# Patient Record
Sex: Female | Born: 1955 | Race: White | Hispanic: No | Smoking: Current every day smoker
Health system: Southern US, Community
[De-identification: ages and names within clinical notes are randomized; demographics above are authoritative.]

## PROBLEM LIST (undated history)

## (undated) DIAGNOSIS — I509 Heart failure, unspecified: Secondary | ICD-10-CM

## (undated) DIAGNOSIS — R296 Repeated falls: Secondary | ICD-10-CM

## (undated) DIAGNOSIS — E785 Hyperlipidemia, unspecified: Secondary | ICD-10-CM

## (undated) DIAGNOSIS — J449 Chronic obstructive pulmonary disease, unspecified: Secondary | ICD-10-CM

## (undated) DIAGNOSIS — F17200 Nicotine dependence, unspecified, uncomplicated: Secondary | ICD-10-CM

## (undated) DIAGNOSIS — M810 Age-related osteoporosis without current pathological fracture: Secondary | ICD-10-CM

## (undated) DIAGNOSIS — I4891 Unspecified atrial fibrillation: Secondary | ICD-10-CM

## (undated) DIAGNOSIS — R413 Other amnesia: Secondary | ICD-10-CM

## (undated) DIAGNOSIS — J9601 Acute respiratory failure with hypoxia: Secondary | ICD-10-CM

## (undated) DIAGNOSIS — I6529 Occlusion and stenosis of unspecified carotid artery: Secondary | ICD-10-CM

## (undated) DIAGNOSIS — I739 Peripheral vascular disease, unspecified: Secondary | ICD-10-CM

## (undated) DIAGNOSIS — Z9981 Dependence on supplemental oxygen: Secondary | ICD-10-CM

## (undated) HISTORY — PX: TUBAL LIGATION: SHX77

## (undated) HISTORY — DX: Nicotine dependence, unspecified, uncomplicated: F17.200

---
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
## 2013-07-05 ENCOUNTER — Encounter (HOSPITAL_COMMUNITY): Payer: Self-pay | Admitting: Emergency Medicine

## 2013-07-05 ENCOUNTER — Emergency Department (HOSPITAL_COMMUNITY): Payer: Self-pay

## 2013-07-05 ENCOUNTER — Emergency Department (HOSPITAL_COMMUNITY)
Admission: EM | Admit: 2013-07-05 | Discharge: 2013-07-05 | Disposition: A | Discharge status: Home / Self Care | Payer: Self-pay | Attending: Emergency Medicine | Admitting: Emergency Medicine

## 2013-07-05 DIAGNOSIS — K047 Periapical abscess without sinus: Secondary | ICD-10-CM | POA: Insufficient documentation

## 2013-07-05 DIAGNOSIS — W1809XA Striking against other object with subsequent fall, initial encounter: Secondary | ICD-10-CM | POA: Insufficient documentation

## 2013-07-05 DIAGNOSIS — Y929 Unspecified place or not applicable: Secondary | ICD-10-CM | POA: Insufficient documentation

## 2013-07-05 DIAGNOSIS — S2231XA Fracture of one rib, right side, initial encounter for closed fracture: Secondary | ICD-10-CM

## 2013-07-05 DIAGNOSIS — S2239XA Fracture of one rib, unspecified side, initial encounter for closed fracture: Secondary | ICD-10-CM | POA: Insufficient documentation

## 2013-07-05 DIAGNOSIS — W010XXA Fall on same level from slipping, tripping and stumbling without subsequent striking against object, initial encounter: Secondary | ICD-10-CM | POA: Insufficient documentation

## 2013-07-05 DIAGNOSIS — Y9389 Activity, other specified: Secondary | ICD-10-CM | POA: Insufficient documentation

## 2013-07-05 DIAGNOSIS — F17200 Nicotine dependence, unspecified, uncomplicated: Secondary | ICD-10-CM | POA: Insufficient documentation

## 2013-07-05 MED ORDER — AMOXICILLIN 500 MG PO CAPS: 500.0000 mg | ORAL_CAPSULE | Freq: Three times a day (TID) | ORAL | Status: DC

## 2013-07-05 MED ORDER — OXYCODONE-ACETAMINOPHEN 5-325 MG PO TABS: 1.0000 | ORAL_TABLET | Freq: Four times a day (QID) | ORAL | Status: DC | PRN

## 2013-07-05 MED ORDER — HYDROCODONE-ACETAMINOPHEN 5-325 MG PO TABS: 1.0000 | ORAL_TABLET | ORAL | Status: DC | PRN

## 2013-07-05 MED ORDER — OXYCODONE-ACETAMINOPHEN 5-325 MG PO TABS
1.0000 | ORAL_TABLET | Freq: Once | ORAL | Status: AC
  Administered 2013-07-05: 1 via ORAL
  Filled 2013-07-05: qty 1

## 2013-07-05 NOTE — ED Provider Notes (Signed)
CSN: [id number]     Arrival date & time 07/05/13  1308 History   First MD Initiated Contact with Patient 07/05/13 1327     Chief Complaint  Patient presents with  . Dental Pain  . Chest Pain   (Consider location/radiation/quality/duration/timing/severity/associated sxs/prior Treatment) Patient is a 57 y.o. female presenting with fall. The history is provided by the patient.  Fall This is a new problem. The current episode started in the past 7 days. The problem has been unchanged. Pertinent negatives include no abdominal pain, chills, congestion, coughing, fever, headaches, nausea, sore throat or vomiting. The symptoms are aggravated by bending, twisting and walking. She has tried acetaminophen and heat for the symptoms. The treatment provided no relief.   Carrie Case is a 57 y.o. female who presents to the ED with right rib pain that started suddenly after she tripped and fell over a trash can outside 2 days ago. She denies any other injuries. She also complains of dental pain in the lower right dental area with swelling and drainage.  History reviewed. No pertinent past medical history. Past Surgical History  Procedure Laterality Date  . Tubal ligation     No family history on file. History  Substance Use Topics  . Smoking status: Current Every Day Smoker  . Smokeless tobacco: Not on file  . Alcohol Use: No   OB History   Grav Para Term Preterm Abortions TAB SAB Ect Mult Living                 Review of Systems  Constitutional: Negative for fever and chills.  HENT: Positive for dental problem and facial swelling. Negative for congestion and sore throat.   Eyes: Negative for visual disturbance.  Respiratory: Negative for cough, shortness of breath and wheezing.   Gastrointestinal: Negative for nausea, vomiting and abdominal pain.  Genitourinary: Negative for dysuria, urgency and frequency.  Musculoskeletal:       Right rib pain  Skin: Negative for wound.    Allergic/Immunologic: Negative for immunocompromised state.  Neurological: Negative for dizziness and headaches.  Psychiatric/Behavioral: The patient is not nervous/anxious.     Allergies  Ibuprofen  Home Medications   Current Outpatient Rx  Name  Route  Sig  Dispense  Refill  . acetaminophen (TYLENOL) 500 MG tablet   Oral   Take 1,000 mg by mouth every 6 (six) hours as needed for mild pain.          BP 142/78  Pulse 87  Temp(Src) 98.4 F (36.9 C) (Oral)  Resp 21  Ht 5\' 6"  (1.676 m)  Wt 103 lb (46.72 kg)  BMI 16.63 kg/m2  SpO2 99% Physical Exam  Nursing note and vitals reviewed. Constitutional: She is oriented to person, place, and time. She appears well-developed and well-nourished. No distress.  HENT:  Head: Normocephalic and atraumatic.  Mouth/Throat: Uvula is midline and oropharynx is clear and moist.    Dental abscess  Eyes: EOM are normal.  Neck: Neck supple.  Cardiovascular: Normal rate and regular rhythm.   Pulmonary/Chest: Effort normal and breath sounds normal.    Tender right anterior ribs  Abdominal: Soft. There is no tenderness.  Musculoskeletal: Normal range of motion.  Lymphadenopathy:    She has cervical adenopathy (right).  Neurological: She is alert and oriented to person, place, and time. No cranial nerve deficit.  Skin: Skin is warm and dry.  Psychiatric: She has a normal mood and affect. Her behavior is normal.    ED Course  Procedures  EKG Interpretation   None      Dg Ribs Unilateral W/chest Right  07/05/2013   CLINICAL DATA:  Fall, right chest and flank pain  EXAM: RIGHT RIBS AND CHEST - 3+ VIEW  COMPARISON:  None.  FINDINGS: Normal heart size. Chronic bronchitic changes centrally and diffuse interstitial prominence, nonspecific. Suspect chronic bronchitis. No definite focal pneumonia, collapse or consolidation. No effusion or pneumothorax. Inferior lateral right 8th acute rib fracture noted on the oblique view.  IMPRESSION:  Acute inferior lateral right 8th rib fracture.  No other acute finding   Electronically Signed   By: Ruel Favors M.D.   On: 07/05/2013 13:43    MDM  57 y.o. female with right 8th rib fracture after falling over a trash can 2 days ago. Also here with a dental abscess. Patient stable for discharge without any immediate complications. Lungs clear with good air movement. O2 SAT 99% on R/A. I have reviewed this patient's vital signs, nurses notes, appropriate labs and imaging.  I have discussed findings with the patient and plan of care. She voices understanding.    Medication List    TAKE these medications       amoxicillin 500 MG capsule  Commonly known as:  AMOXIL  Take 1 capsule (500 mg total) by mouth 3 (three) times daily.     oxyCODONE-acetaminophen 5-325 MG per tablet  Commonly known as:  ROXICET  Take 1 tablet by mouth every 6 (six) hours as needed for severe pain.      ASK your doctor about these medications       acetaminophen 500 MG tablet  Commonly known as:  TYLENOL  Take 1,000 mg by mouth every 6 (six) hours as needed for mild pain.           Janne Napoleon, [place] 07/05/13 [phone]

## 2013-07-05 NOTE — ED Notes (Signed)
Pt reports 2 days ago tripped and fell on her trash can.  C/O pain r ribs.  Pt also c/o toothache x 4 days.

## 2013-07-06 NOTE — ED Provider Notes (Signed)
Medical screening examination/treatment/procedure(s) were performed by non-physician practitioner and as supervising physician I was immediately available for consultation/collaboration.  EKG Interpretation   None         Joya Gaskins, MD 07/06/13 [phone]

## 2015-01-08 ENCOUNTER — Emergency Department (HOSPITAL_COMMUNITY)
Admission: EM | Admit: 2015-01-08 | Discharge: 2015-01-08 | Disposition: A | Discharge status: Home / Self Care | Payer: Self-pay | Attending: Emergency Medicine | Admitting: Emergency Medicine

## 2015-01-08 ENCOUNTER — Encounter (HOSPITAL_COMMUNITY): Payer: Self-pay | Admitting: *Deleted

## 2015-01-08 DIAGNOSIS — K0889 Other specified disorders of teeth and supporting structures: Secondary | ICD-10-CM

## 2015-01-08 DIAGNOSIS — Z720 Tobacco use: Secondary | ICD-10-CM | POA: Insufficient documentation

## 2015-01-08 DIAGNOSIS — K088 Other specified disorders of teeth and supporting structures: Secondary | ICD-10-CM | POA: Insufficient documentation

## 2015-01-08 MED ORDER — PENICILLIN V POTASSIUM 500 MG PO TABS: 500.0000 mg | ORAL_TABLET | Freq: Four times a day (QID) | ORAL | Status: AC

## 2015-01-08 MED ORDER — ACETAMINOPHEN 325 MG PO TABS
650.0000 mg | ORAL_TABLET | Freq: Once | ORAL | Status: DC
  Filled 2015-01-08: qty 2

## 2015-01-08 MED ORDER — PENICILLIN V POTASSIUM 250 MG PO TABS
500.0000 mg | ORAL_TABLET | Freq: Once | ORAL | Status: AC
  Administered 2015-01-08: 500 mg via ORAL
  Filled 2015-01-08: qty 2

## 2015-01-08 NOTE — ED Notes (Signed)
Pt reporting tooth abscess on right lower side.

## 2015-01-08 NOTE — Discharge Instructions (Signed)

## 2015-01-08 NOTE — ED Provider Notes (Signed)
CSN: [id number]     Arrival date & time 01/08/15  1823 History   First MD Initiated Contact with Patient 01/08/15 1919     Chief Complaint  Patient presents with  . Dental Pain     Patient is a 59 y.o. female presenting with tooth pain. The history is provided by the patient.  Dental Pain Location:  Lower Severity:  Moderate Onset quality:  Gradual Duration:  1 day Timing:  Constant Progression:  Worsening Chronicity:  Recurrent Relieved by:  Rest Worsened by:  Jaw movement and pressure Associated symptoms: no difficulty swallowing and no fever     PMH - none  Past Surgical History  Procedure Laterality Date  . Tubal ligation     History reviewed. No pertinent family history. History  Substance Use Topics  . Smoking status: Current Every Day Smoker    Types: Cigarettes  . Smokeless tobacco: Not on file  . Alcohol Use: No   OB History    No data available     Review of Systems  Constitutional: Negative for fever.  Gastrointestinal: Negative for vomiting.      Allergies  Ibuprofen  Home Medications   Prior to Admission medications   Medication Sig Start Date End Date Taking? Authorizing Provider  acetaminophen (TYLENOL) 500 MG tablet Take 1,000 mg by mouth every 6 (six) hours as needed for mild pain.    Historical Provider, MD  penicillin v potassium (VEETID) 500 MG tablet Take 1 tablet (500 mg total) by mouth 4 (four) times daily. 01/08/15 01/15/15  Zadie Rhineonald German Manke, MD   BP 160/95 mmHg  Pulse 70  Temp(Src) 98.5 F (36.9 C) (Oral)  Resp 18  Ht 5\' 6"  (1.676 m)  Wt 160 lb (72.576 kg)  BMI 25.84 kg/m2  SpO2 100% Physical Exam CONSTITUTIONAL: Well developed/well nourished HEAD AND FACE: Normocephalic/atraumatic EYES: EOMI/PERRL ENMT: Mucous membranes moist.  Poor dentition.  No trismus.  No focal abscess noted.   NECK: supple no meningeal signs CV: S1/S2 noted, no murmurs/rubs/gallops noted LUNGS: Lungs are clear to auscultation bilaterally, no  apparent distress ABDOMEN: soft, nontender, no rebound or guarding NEURO: Pt is awake/alert, moves all extremitiesx4 EXTREMITIES:full ROM SKIN: warm, color normal  ED Course  Procedures    MDM   Final diagnoses:  Pain, dental    Nursing notes including past medical history and social history reviewed and considered in documentation     Zadie Rhineonald Priyana Mccarey, MD 01/08/15 [phone]

## 2018-12-22 ENCOUNTER — Encounter (HOSPITAL_COMMUNITY): Payer: Self-pay | Admitting: Internal Medicine

## 2018-12-22 ENCOUNTER — Inpatient Hospital Stay (HOSPITAL_COMMUNITY)
Admission: EM | Admit: 2018-12-22 | Discharge: 2018-12-25 | DRG: 308 | Disposition: A | Discharge status: Home / Self Care | Payer: Self-pay | Source: Other Acute Inpatient Hospital | Attending: Family Medicine | Admitting: Family Medicine

## 2018-12-22 DIAGNOSIS — J9601 Acute respiratory failure with hypoxia: Secondary | ICD-10-CM | POA: Diagnosis present

## 2018-12-22 DIAGNOSIS — I4891 Unspecified atrial fibrillation: Secondary | ICD-10-CM | POA: Diagnosis present

## 2018-12-22 DIAGNOSIS — R0989 Other specified symptoms and signs involving the circulatory and respiratory systems: Secondary | ICD-10-CM

## 2018-12-22 DIAGNOSIS — R918 Other nonspecific abnormal finding of lung field: Secondary | ICD-10-CM

## 2018-12-22 DIAGNOSIS — R0682 Tachypnea, not elsewhere classified: Secondary | ICD-10-CM

## 2018-12-22 DIAGNOSIS — I480 Paroxysmal atrial fibrillation: Principal | ICD-10-CM | POA: Diagnosis present

## 2018-12-22 DIAGNOSIS — Z8249 Family history of ischemic heart disease and other diseases of the circulatory system: Secondary | ICD-10-CM

## 2018-12-22 DIAGNOSIS — Z79899 Other long term (current) drug therapy: Secondary | ICD-10-CM

## 2018-12-22 DIAGNOSIS — Z560 Unemployment, unspecified: Secondary | ICD-10-CM

## 2018-12-22 DIAGNOSIS — Z7982 Long term (current) use of aspirin: Secondary | ICD-10-CM

## 2018-12-22 DIAGNOSIS — Z886 Allergy status to analgesic agent status: Secondary | ICD-10-CM

## 2018-12-22 DIAGNOSIS — F17200 Nicotine dependence, unspecified, uncomplicated: Secondary | ICD-10-CM | POA: Diagnosis present

## 2018-12-22 DIAGNOSIS — J22 Unspecified acute lower respiratory infection: Secondary | ICD-10-CM | POA: Diagnosis present

## 2018-12-22 DIAGNOSIS — Z20828 Contact with and (suspected) exposure to other viral communicable diseases: Secondary | ICD-10-CM | POA: Diagnosis present

## 2018-12-22 DIAGNOSIS — F17210 Nicotine dependence, cigarettes, uncomplicated: Secondary | ICD-10-CM | POA: Diagnosis present

## 2018-12-22 DIAGNOSIS — Z9851 Tubal ligation status: Secondary | ICD-10-CM

## 2018-12-22 HISTORY — DX: Acute respiratory failure with hypoxia: J96.01

## 2018-12-22 LAB — HEMOGLOBIN A1C
Hgb A1c MFr Bld: 5.6 % (ref 4.8–5.6)
Mean Plasma Glucose: 114.02 mg/dL

## 2018-12-22 LAB — CBC
HCT: 40 % (ref 36.0–46.0)
Hemoglobin: 13.3 g/dL (ref 12.0–15.0)
MCH: 32.5 pg (ref 26.0–34.0)
MCHC: 33.3 g/dL (ref 30.0–36.0)
MCV: 97.8 fL (ref 80.0–100.0)
Platelets: 381 10*3/uL (ref 150–400)
RBC: 4.09 MIL/uL (ref 3.87–5.11)
RDW: 13.8 % (ref 11.5–15.5)
WBC: 18.8 10*3/uL — ABNORMAL HIGH (ref 4.0–10.5)
nRBC: 1.7 % — ABNORMAL HIGH (ref 0.0–0.2)

## 2018-12-22 LAB — C-REACTIVE PROTEIN
CRP: 5.2 mg/dL — ABNORMAL HIGH (ref <1.0)
CRP: 5.2 mg/dL — ABNORMAL HIGH (ref <1.0)

## 2018-12-22 LAB — BASIC METABOLIC PANEL
Anion gap: 9 (ref 5–15)
BUN: 21 mg/dL (ref 8–23)
CO2: 27 mmol/L (ref 22–32)
Calcium: 9.7 mg/dL (ref 8.9–10.3)
Chloride: 105 mmol/L (ref 98–111)
Creatinine, Ser: 0.62 mg/dL (ref 0.44–1.00)
GFR calc Af Amer: >60 mL/min (ref >60)
GFR calc non Af Amer: >60 mL/min (ref >60)
Glucose, Bld: 109 mg/dL — ABNORMAL HIGH (ref 70–99)
Potassium: 4 mmol/L (ref 3.5–5.1)
Sodium: 141 mmol/L (ref 135–145)

## 2018-12-22 LAB — D-DIMER, QUANTITATIVE: D-Dimer, Quant: 2.87 ug/mL-FEU — ABNORMAL HIGH (ref 0.00–0.50)

## 2018-12-22 LAB — FIBRINOGEN: Fibrinogen: 593 mg/dL — ABNORMAL HIGH (ref 210–475)

## 2018-12-22 LAB — TSH: TSH: 1.246 u[IU]/mL (ref 0.350–4.500)

## 2018-12-22 LAB — HEPARIN LEVEL (UNFRACTIONATED): Heparin Unfractionated: 0.28 IU/mL — ABNORMAL LOW (ref 0.30–0.70)

## 2018-12-22 LAB — PROCALCITONIN: Procalcitonin: 0.2 ng/mL

## 2018-12-22 LAB — LACTIC ACID, PLASMA
Lactic Acid, Venous: 1.4 mmol/L (ref 0.5–1.9)
Lactic Acid, Venous: 1.9 mmol/L (ref 0.5–1.9)

## 2018-12-22 LAB — FERRITIN: Ferritin: 224 ng/mL (ref 11–307)

## 2018-12-22 LAB — SARS CORONAVIRUS 2 BY RT PCR (HOSPITAL ORDER, PERFORMED IN ~~LOC~~ HOSPITAL LAB): SARS Coronavirus 2: NEGATIVE

## 2018-12-22 MED ORDER — OXYCODONE HCL 5 MG PO TABS: 5.0000 mg | ORAL_TABLET | ORAL | Status: DC | PRN

## 2018-12-22 MED ORDER — HEPARIN BOLUS VIA INFUSION
4000.0000 [IU] | Freq: Once | INTRAVENOUS | Status: AC
  Administered 2018-12-22: 4000 [IU] via INTRAVENOUS
  Filled 2018-12-22: qty 4000

## 2018-12-22 MED ORDER — ACETAMINOPHEN 325 MG PO TABS: 650.0000 mg | ORAL_TABLET | Freq: Four times a day (QID) | ORAL | Status: DC | PRN

## 2018-12-22 MED ORDER — ONDANSETRON HCL 4 MG/2ML IJ SOLN: 4.0000 mg | Freq: Four times a day (QID) | INTRAMUSCULAR | Status: DC | PRN

## 2018-12-22 MED ORDER — DILTIAZEM HCL-DEXTROSE 100-5 MG/100ML-% IV SOLN (PREMIX)
5.0000 mg/h | INTRAVENOUS | Status: AC
  Administered 2018-12-22: 15 mg/h via INTRAVENOUS
  Administered 2018-12-22: 5 mg/h via INTRAVENOUS
  Administered 2018-12-23: 13 mg/h via INTRAVENOUS
  Administered 2018-12-23: 15 mg/h via INTRAVENOUS
  Filled 2018-12-22 (×5): qty 100

## 2018-12-22 MED ORDER — ASPIRIN EC 81 MG PO TBEC
81.0000 mg | DELAYED_RELEASE_TABLET | Freq: Every day | ORAL | Status: DC
  Administered 2018-12-22 – 2018-12-25 (×4): 81 mg via ORAL
  Filled 2018-12-22 (×4): qty 1

## 2018-12-22 MED ORDER — POLYETHYLENE GLYCOL 3350 17 G PO PACK: 17.0000 g | PACK | Freq: Every day | ORAL | Status: DC | PRN

## 2018-12-22 MED ORDER — ONDANSETRON HCL 4 MG PO TABS: 4.0000 mg | ORAL_TABLET | Freq: Four times a day (QID) | ORAL | Status: DC | PRN

## 2018-12-22 MED ORDER — ALBUTEROL SULFATE HFA 108 (90 BASE) MCG/ACT IN AERS
2.0000 | INHALATION_SPRAY | RESPIRATORY_TRACT | Status: DC | PRN
  Filled 2018-12-22: qty 6.7

## 2018-12-22 MED ORDER — DOCUSATE SODIUM 100 MG PO CAPS
100.0000 mg | ORAL_CAPSULE | Freq: Two times a day (BID) | ORAL | Status: DC
  Administered 2018-12-22 – 2018-12-25 (×7): 100 mg via ORAL
  Filled 2018-12-22 (×7): qty 1

## 2018-12-22 MED ORDER — NICOTINE 21 MG/24HR TD PT24
21.0000 mg | MEDICATED_PATCH | Freq: Every day | TRANSDERMAL | Status: DC
  Administered 2018-12-22 – 2018-12-25 (×4): 21 mg via TRANSDERMAL
  Filled 2018-12-22 (×4): qty 1

## 2018-12-22 MED ORDER — BISACODYL 5 MG PO TBEC: 5.0000 mg | DELAYED_RELEASE_TABLET | Freq: Every day | ORAL | Status: DC | PRN

## 2018-12-22 MED ORDER — HEPARIN (PORCINE) 25000 UT/250ML-% IV SOLN
1300.0000 [IU]/h | INTRAVENOUS | Status: DC
  Administered 2018-12-22: 1000 [IU]/h via INTRAVENOUS
  Administered 2018-12-23: 1200 [IU]/h via INTRAVENOUS
  Filled 2018-12-22 (×2): qty 250

## 2018-12-22 NOTE — Progress Notes (Signed)
Call from Pharmacy change Heparin drip to 1100 units/hr or 11 ml/hr.

## 2018-12-22 NOTE — Consult Note (Signed)
[facility]       Reason for Consult: pulmonary nodules    Referring Physician: Dr. Ophelia CharterYates  Principal Problem:   Acute respiratory failure with hypoxia [facility](HCC) Active Problems:   Tobacco dependence   Atrial fibrillation with RVR (HCC)   . aspirin EC  81 mg Oral Daily  . docusate sodium  100 mg Oral BID  . nicotine  21 mg Transdermal Daily    Recommendations: Observe off of antibiotics Repeat CXR or CT in 3 months  Will consider further evaluation as an outpatient I do not see an indication for repeat COVID testing  Assessment: [facility] that [facility] family felt she was having though the patient states she was asymptomatic.  CT of chest with no PE or opacity and only some small nodules and early bronchiectatic changes.     She has some hypoxia in the setting of afib.  She has no fever, no pulmonary opacities so not c/w COVID  Respiratory failure - likely from afib.  No signs of infection.    Antibiotics: none  HPI: Carrie Case is a 63 y.o. female with a long history of tobacco abuse who [facility] and CT with findings as above.  Transferred here for further management.  Long smoking history. Recently was seen by a local physician and gave her doxycycline and inhaler for possible COPD exacerbation.  No fever at home.  Now found to be tachycardic, in afib.  [facility] though no indication as to what it was for.     Review of Systems:  Constitutional: negative for fevers, chills, malaise and anorexia Respiratory: positive for cough, negative for sputum or hemoptysis Integument/breast: negative for rash All other systems reviewed and are negative    PMH: tobacco abuse  Social History   Tobacco Use  . Smoking status: Current Every Day Smoker    Packs/day: 2.00    Years: 47.00    Pack years: 94.00    Types: Cigarettes  . Smokeless tobacco: Never Used   Substance Use Topics  . Alcohol use: No  . Drug use: No    FMH: + cardiovascular disease  Allergies  Allergen Reactions  . Ibuprofen Hives    Physical Exam: Constitutional: in no apparent distress  Vitals:   12/22/18 1128 12/22/18 1200  BP: (!) 135/117 121/81  Pulse:    Temp:    SpO2:     EYES: anicteric ENMT: no thrush Cardiovascular: Cor irreg, irreg RRR Respiratory: CTA B; normal respiratory effort GI: Bowel sounds are normal, liver is not enlarged, spleen is not enlarged Musculoskeletal: no pedal edema noted Skin: negatives: no rash Neuro: non-focal  Lab Results  Component Value Date   WBC 18.8 (H) 12/22/2018   HGB 13.3 12/22/2018   HCT 40.0 12/22/2018   MCV 97.8 12/22/2018   PLT 381 12/22/2018    Lab Results  Component Value Date   CREATININE 0.62 12/22/2018   BUN 21 12/22/2018   NA 141 12/22/2018   K 4.0 12/22/2018   CL 105 12/22/2018   CO2 27 12/22/2018   No results found for: ALT, AST, GGT, ALKPHOS   Microbiology: Recent Results (from the past 240 hour(s))  Culture, blood (Routine X 2) w Reflex to ID Panel     Status: None (Preliminary result)   Collection Time: 12/22/18 11:40 AM  Result Value Ref Range Status   Specimen Description  BLOOD RIGHT ANTECUBITAL  Final   Special Requests   Final    BOTTLES DRAWN AEROBIC ONLY Blood Culture results may not be optimal due to an inadequate volume of blood received in culture bottles   Culture   Final    NO GROWTH < 12 HOURS Performed at [facility], [address], [place], [place] [address]    Report Status PENDING  Incomplete  Culture, blood (Routine X 2) w Reflex to ID Panel     Status: None (Preliminary result)   Collection Time: 12/22/18 11:45 AM  Result Value Ref Range Status   Specimen Description BLOOD RIGHT ANTECUBITAL  Final   Special Requests   Final    BOTTLES DRAWN AEROBIC ONLY Blood Culture adequate volume   Culture   Final    NO GROWTH < 12 HOURS Performed at [facility], [address], [place], [place] [address]    Report Status PENDING  Incomplete  SARS Coronavirus 2 [facility] order, Performed in [facility] hospital lab)     Status: None   Collection Time: 12/22/18  2:58 PM  Result Value Ref Range Status   SARS Coronavirus 2 NEGATIVE NEGATIVE Final    Comment: (NOTE) If result is NEGATIVE SARS-CoV-2 target nucleic acids are NOT DETECTED. The SARS-CoV-2 RNA is generally detectable in upper and lower  respiratory specimens during the acute phase of infection. The lowest  concentration of SARS-CoV-2 viral copies this assay can detect is 250  copies / mL. A negative result does not preclude SARS-CoV-2 infection  and should not be used as the sole basis for treatment or other  patient management decisions.  A negative result may occur with  improper specimen collection / handling, submission of specimen other  than nasopharyngeal swab, presence of viral mutation(s) within the  areas targeted by this assay, and inadequate number of viral copies  (<250 copies / mL). A negative result must be combined with clinical  observations, patient history, and epidemiological information. If result is POSITIVE SARS-CoV-2 target nucleic acids are DETECTED. The SARS-CoV-2 RNA is generally detectable in upper and lower  respiratory specimens dur ing the acute phase of infection.  Positive  results are indicative of active infection with SARS-CoV-2.  Clinical  correlation with patient history and other diagnostic information is  necessary to determine patient infection status.  Positive results do  not rule out bacterial infection or co-infection with other viruses. If result is PRESUMPTIVE POSTIVE SARS-CoV-2 nucleic acids MAY BE PRESENT.   A presumptive positive result was obtained on the submitted specimen  and confirmed on repeat testing.  While 2019 novel coronavirus  (SARS-CoV-2) nucleic acids may be present in the submitted sample  additional  confirmatory testing may be necessary for epidemiological  and / or clinical management purposes  to differentiate between  SARS-CoV-2 and other Sarbecovirus currently known to infect humans.  If clinically indicated additional testing with an alternate test  methodology [phone]) is advised. The SARS-CoV-2 RNA is generally  detectable in upper and lower respiratory sp ecimens during the acute  phase of infection. The expected result is Negative. Fact Sheet for Patients:  BoilerBrush.com.cy Fact Sheet for Healthcare Providers: https://pope.com/ This test is not yet approved or cleared by the [place] FDA and has been authorized for detection and/or diagnosis of SARS-CoV-2 by FDA under an Emergency Use Authorization (EUA).  This EUA will remain in effect (meaning this test can be used) for the duration of the COVID-19 declaration  under Section 564(b)(1) of the Act, 21 U.S.C. section 360bbb-3(b)(1), unless the authorization is terminated or revoked sooner. Performed at [facility], [address] [place], [place] [address]     Gardiner Barefoot, MD [facility] [email or web address] 12/22/2018, 5:08 PM

## 2018-12-22 NOTE — Progress Notes (Signed)
Lab came and drew speciman for Heparin level

## 2018-12-22 NOTE — Progress Notes (Signed)
ANTICOAGULATION CONSULT NOTE  Pharmacy Consult:  Heparin Indication: atrial fibrillation   Patient Measurements: Height: 5\' 6"  (167.6 cm) Weight: 122 lb 9.2 oz (55.6 kg) IBW/kg (Calculated) : 59.3 Heparin Dosing Weight: 56 kg   Vital Signs: Temp: 98.5 F (36.9 C) (05/04 2020) Temp Source: Oral (05/04 2020) BP: 108/92 (05/04 2020) Pulse Rate: 113 (05/04 2021)  Labs: Recent Labs    12/22/18 1143 12/22/18 2031  HGB 13.3  --   HCT 40.0  --   PLT 381  --   HEPARINUNFRC  --  0.28*  CREATININE 0.62  --      Assessment: 3 YOF admitted with dyspnea, found to be in AFib and started on IV heparin.  Heparin level is slightly sub-therapeutic.  No issue with infusion per RN.  No bleeding reported.  Goal of Therapy:  Heparin level 0.3-0.7 units/ml Monitor platelets by anticoagulation protocol: Yes   Plan:  Increase heparin gtt to 1100 unit/hr F/U AM labs  Roxy Filler D. Laney Potash, PharmD, BCPS, BCCCP 12/22/2018, 10:04 PM

## 2018-12-22 NOTE — Progress Notes (Signed)
ANTICOAGULATION CONSULT NOTE - Initial Consult  Pharmacy Consult for heparin Indication: atrial fibrillation   Patient Measurements: Height: 5\' 6"  (167.6 cm) Weight: 160 lb (72.6 kg) IBW/kg (Calculated) : 59.3 Heparin Dosing Weight: 72.6 kg  Vital Signs: Temp: 96.4 F (35.8 C) (05/04 1056) Temp Source: Axillary (05/04 1056) BP: 124/102 (05/04 1000) Pulse Rate: 140 (05/04 1000)  Labs: No results for input(s): HGB, HCT, PLT, APTT, LABPROT, INR, HEPARINUNFRC, HEPRLOWMOCWT, CREATININE, CKTOTAL, CKMB, TROPONINI in the last 72 hours.   Assessment: 63 yo female admitted with dyspnea, found to be in AFib. Starting heparin infusion. SCr 0.6, cbc ok.   Goal of Therapy:  Heparin level 0.3-0.7 units/ml Monitor platelets by anticoagulation protocol: Yes    Plan:  -Heparin bolus 4000 units x1 then 1000 units/hr -Daily HL, CBC -Check level this afternoon   Baldemar Friday 12/22/2018,11:06 AM

## 2018-12-22 NOTE — H&P (Signed)
History and Physical    Carrie Case:[id number] DOB: Dec 28, 1955 DOA: 12/22/2018  PCP: Patient, No Pcp Per Consultants:  None Patient coming from:  Home - lives with daughter and roommate; NOK: Daughter, [phone]  Chief Complaint: SOB  HPI: Carrie Case is a 63 y.o. female with no significant past medical history significant of presetngin with SOB.  "I don't know.  I honestly don't know."  She went to [facility] because "they told me if I refused to go they were gonna make me go anyway."  Her daughter was concerned that the she couldn't breathe, but he patient states she did not feel SOB.  No fever.  Slight cough, nonproductive.  Smokes 1ppd now, previously 2ppd.  She was testing for COVID at the hospital.  Currently, she feels fine.  Denies h/o afib.  She does not feel like her heart is racing.  I spoke with the patient's daughter.  She had been sick.  Her brother wanted her to see how much the covid test costs.  All the family wanted her tested.  They called the covid helpline on Thursday.  The family could not convince her to go to the hospital.  Monday, she said she PNA.  Wednesday, she thought she had COVID.  Friday, she hadn't gotten her medicine.  She was a little more coherent, but Saturday and Sunday family couldn't reach her.  Her daughter came to see her and called 911 and transported her to the hospital.  They gave her some Cardizem.  She has been confused, talking out of her head.  "I don't live with her, I've been calling and texting."  The patient was "screaming at me, I didn't do it."  Her daughter is not aware of her having a problem with drugs, but sometimes her speech is slurred.  She had a telephone visit from 4/30-5/1 for cough, SOB since 4/19, concern for COVID.  Long-standing tobacco history, ?undiagnosed COPD.  Treated empircally as CAP with inhalers for reactive airways.   Given doxycycline, Albuterol HFA and nebs.  ED Course: [facility] transfer, per Dr. Julian Reil:  Rudean Curt 63 yo F comes in with dyspnea for several days, intermittently confused at home. Tachycardic, A.Fib RVR 170-180 en route. Cardizem did slow her to 130s. Mild confusion. 9/10 pain due to "tooth ache". Daily smoker, no home O2. Minimal cough over last several days.  Initially 94% on 2L, BP 120s / 90s, HR 120 afib, RR 25.  Put on Cardizem gtt but BP drops when HR goes below 120s. then she became more obtunded with GCS 13. finally got history that she took oxycodone. Got Narcan and now awake. Now denying that shes in pain.  CBC WBC 28.7, HGB 14.7. Sodium 136, BUN 44 with creat 0.7. BGL 150. 2 lactates 1.9 and 2.0. AST and ALT okay. Trop nl x1. ABG 7.41, CO2 48, O2 61 (but on room air) before Narcan.  30/kg NS  vanc and Zosyn for ABx  no fever  CTA shows no clot, no PNA, but ? MAI per radiologist. Only on 2L. COVID test is a send out so no results back. So this is a PUI for covid at this point.  Review of Systems: As per HPI; otherwise review of systems reviewed and negative.   Ambulatory Status:  Ambulates without assistance  History reviewed. No pertinent past medical history.  Past Surgical History:  Procedure Laterality Date  . TUBAL LIGATION      Social History   Socioeconomic  History  . Marital status: Widowed    Spouse name: Not on file  . Number of children: Not on file  . Years of education: Not on file  . Highest education level: Not on file  Occupational History  . Occupation: unemployed  Social Needs  . Financial resource strain: Not on file  . Food insecurity:    Worry: Not on file    Inability: Not on file  . Transportation needs:    Medical: Not on file    Non-medical: Not on file  Tobacco Use  . Smoking status: Current Every Day Smoker    Packs/day: 2.00    Years: 47.00    Pack years: 94.00    Types: Cigarettes  . Smokeless tobacco: Never Used  Substance and Sexual Activity  . Alcohol use: No  . Drug use: No  . Sexual activity: Not on file   Lifestyle  . Physical activity:    Days per week: Not on file    Minutes per session: Not on file  . Stress: Not on file  Relationships  . Social connections:    Talks on phone: Not on file    Gets together: Not on file    Attends religious service: Not on file    Active member of club or organization: Not on file    Attends meetings of clubs or organizations: Not on file    Relationship status: Not on file  . Intimate partner violence:    Fear of current or ex partner: Not on file    Emotionally abused: Not on file    Physically abused: Not on file    Forced sexual activity: Not on file  Other Topics Concern  . Not on file  Social History Narrative  . Not on file    Allergies  Allergen Reactions  . Ibuprofen Hives    History reviewed. No pertinent family history.  Prior to Admission medications   Medication Sig Start Date End Date Taking? Authorizing Provider  acetaminophen (TYLENOL) 500 MG tablet Take 1,000 mg by mouth every 6 (six) hours as needed for mild pain.    [provider]    Physical Exam: Vitals:   12/22/18 1000 12/22/18 1056 12/22/18 1128 12/22/18 1200  BP: (!) 124/102  (!) 135/117 121/81  Pulse: (!) 140     Temp: (!) 96.4 F (35.8 C) (!) 96.4 F (35.8 C)    TempSrc: Axillary Axillary    SpO2: 93%     Weight:  55.6 kg    Height:  5\' 6"  (1.676 m)       . General:  Appears chronically ill and cachectic . Eyes:  EOMI, normal lids, iris . ENT:  grossly normal hearing, lips & tongue, mmm . Neck:  no LAD, masses or thyromegaly . Cardiovascular:  Irregularly irregular, ongoing tachycardia to 120 range despite 15 Cardizem, no m/r/g. No LE edema.  Marland Kitchen. Respiratory:   Diffuse rhonchi scattered throughout lung fields with mildly increased respiratory effort. . Abdomen:  soft, NT, ND, NABS . Back:   normal alignment, no CVAT . Skin:  no rash or induration seen on limited exam . Musculoskeletal:  grossly normal tone BUE/BLE, good ROM, no bony  abnormality . Psychiatric:  flat mood and affect, speech fluent and appropriate, AOx3 . Neurologic:  CN 2-12 grossly intact, moves all extremities in coordinated fashion, sensation intact    Radiological Exams on Admission: No results found.  EKG: Independently reviewed.  NSR with rate ; nonspecific ST  changes with no evidence of acute ischemia   Labs on Admission: I have personally reviewed the available labs and imaging studies at the time of the admission.  Pertinent labs from [facility]:   CBC WBC 28.7, HGB 14.7. Sodium 136, BUN 44 with creat 0.7. BGL 150. 2 lactates 1.9 and 2.0. AST and ALT okay. Trop nl x1. ABG 7.41, CO2 48, O2 61 (but on room air) before Narcan.   Here: Glucose 109 CRP 5.2 Lactate 1.4, 1.9 Procalcitonin 0.20 WBC 18.8 D-dimer 2.87 Fibrinogen 593 COVID negative Blood cultures pending  Assessment/Plan Principal Problem:   Acute respiratory failure with hypoxia (HCC) Active Problems:   Tobacco dependence   Atrial fibrillation with RVR (HCC)   Acute respiratory failure with hypoxia -Patient without known h/o medical problems (does not see a physician) presenting with SOB and confusion at home; also with cough mostly nonproductive of sputum -There is no current alternative explanation for her symptoms and so COVID-19 infection must be considered -Initial in-house testing was negative, but will perform a secondary send-out test as well to ensure no active infection -CTA at [facility] did not show PNA or PE but did show changes concerning for MAI; ID consult has been requested. -Pertinent labs concerning for COVID include low procalcitonin; markedly elevated D-dimer (>1); elevated CRP - although these tests are all non-specific -Will admit for further evaluation, close monitoring, and treatment -Monitor on telemetry x at least 24 hours -At this time, will attempt to avoid use of aerosolized medications and use HFAs instead -Will check daily labs including BMP; LFTs;  CBC with differential; CRP (q12h); D-dimer (q12h) -Patient was seen wearing full PPE including: gown, gloves, head cover, N95, and face shield; donning and doffing was observed to be in compliance with current standards. -Patient log was signed  -Blood and sputum cultures are pending  Afib with RVR -Patient found to be in new-onset afib with RVR -Etiology is unknown.  -Since the afib onset is unknown, will focus on rate control and searching for the underlying cause at this time. -Will admit to SDU for Diltiazem drip as per protocol with plan to transition to PO Diltiazem once heart rate is controlled.   -Troponin negative x 1, denies chest pain -Will give ASA 81 mg PO daily.   -Will request Echocardiogram for further evaluation  -Will risk stratify with FLP and HgbA1c; will also order TSH and UDS.  -Patient is not on any anti-coagulatants at home.  -Will start heparin drip per pharmacy for now.  Tobacco dependence -Encourage cessation.   -This was discussed with the patient and should be reviewed on an ongoing basis.   -Patch ordered   DVT prophylaxis:  Heparin  Code Status:  Full - confirmed with patient Family Communication: None present; I spoke with her daughter by telephone. Disposition Plan:  Home once clinically improved Consults called: ID; consider cardiology based on Echo  Admission status: Admit - It is my clinical opinion that admission to INPATIENT is reasonable and necessary because of the expectation that this patient will require hospital care that crosses at least 2 midnights to treat this condition based on the medical complexity of the problems presented.  Given the aforementioned information, the predictability of an adverse outcome is felt to be significant.     Jonah Blue MD [facility]   How to contact the [facility] Attending or Consulting provider 7A - 7P or covering provider during after hours 7P -7A, for this patient?  1. Check the care team  in [facility]  and look for a) attending/consulting TR[facility] provider listed and b) the [facility] team listed 2. Log into [email or web address] and use [facility]'s universal password to access. If you do not have the password, please contact the hospital operator. 3. Locate the [facility] provider you are looking for under [facility] and page to a number that you can be directly reached. 4. If you still have difficulty reaching the provider, please page the [facility] (Director on Call) for the Hospitalists listed on amion for assistance.   12/22/2018, 4:27 PM

## 2018-12-23 ENCOUNTER — Inpatient Hospital Stay (HOSPITAL_COMMUNITY): Payer: Self-pay

## 2018-12-23 DIAGNOSIS — D72829 Elevated white blood cell count, unspecified: Secondary | ICD-10-CM

## 2018-12-23 DIAGNOSIS — I361 Nonrheumatic tricuspid (valve) insufficiency: Secondary | ICD-10-CM

## 2018-12-23 DIAGNOSIS — J439 Emphysema, unspecified: Secondary | ICD-10-CM

## 2018-12-23 LAB — CBC WITH DIFFERENTIAL/PLATELET
Abs Immature Granulocytes: 1.06 10*3/uL — ABNORMAL HIGH (ref 0.00–0.07)
Basophils Absolute: 0.1 10*3/uL (ref 0.0–0.1)
Basophils Relative: 1 %
Eosinophils Absolute: 0.2 10*3/uL (ref 0.0–0.5)
Eosinophils Relative: 1 %
HCT: 38.2 % (ref 36.0–46.0)
Hemoglobin: 12.9 g/dL (ref 12.0–15.0)
Immature Granulocytes: 5 %
Lymphocytes Relative: 12 %
Lymphs Abs: 2.9 10*3/uL (ref 0.7–4.0)
MCH: 32.3 pg (ref 26.0–34.0)
MCHC: 33.8 g/dL (ref 30.0–36.0)
MCV: 95.7 fL (ref 80.0–100.0)
Monocytes Absolute: 1.4 10*3/uL — ABNORMAL HIGH (ref 0.1–1.0)
Monocytes Relative: 6 %
Neutro Abs: 18 10*3/uL — ABNORMAL HIGH (ref 1.7–7.7)
Neutrophils Relative %: 75 %
Platelets: 420 10*3/uL — ABNORMAL HIGH (ref 150–400)
RBC: 3.99 MIL/uL (ref 3.87–5.11)
RDW: 13.5 % (ref 11.5–15.5)
WBC: 23.6 10*3/uL — ABNORMAL HIGH (ref 4.0–10.5)
nRBC: 0.8 % — ABNORMAL HIGH (ref 0.0–0.2)

## 2018-12-23 LAB — LIPID PANEL
Cholesterol: 125 mg/dL (ref 0–200)
HDL: 26 mg/dL — ABNORMAL LOW (ref >40)
LDL Cholesterol: 61 mg/dL (ref 0–99)
Total CHOL/HDL Ratio: 4.8 RATIO
Triglycerides: 190 mg/dL — ABNORMAL HIGH (ref <150)
VLDL: 38 mg/dL (ref 0–40)

## 2018-12-23 LAB — COMPREHENSIVE METABOLIC PANEL
ALT: 36 U/L (ref 0–44)
AST: 27 U/L (ref 15–41)
Albumin: 2.5 g/dL — ABNORMAL LOW (ref 3.5–5.0)
Alkaline Phosphatase: 123 U/L (ref 38–126)
Anion gap: 10 (ref 5–15)
BUN: 7 mg/dL — ABNORMAL LOW (ref 8–23)
CO2: 32 mmol/L (ref 22–32)
Calcium: 9.9 mg/dL (ref 8.9–10.3)
Chloride: 96 mmol/L — ABNORMAL LOW (ref 98–111)
Creatinine, Ser: 0.56 mg/dL (ref 0.44–1.00)
GFR calc Af Amer: >60 mL/min (ref >60)
GFR calc non Af Amer: >60 mL/min (ref >60)
Glucose, Bld: 115 mg/dL — ABNORMAL HIGH (ref 70–99)
Potassium: 3.4 mmol/L — ABNORMAL LOW (ref 3.5–5.1)
Sodium: 138 mmol/L (ref 135–145)
Total Bilirubin: 0.4 mg/dL (ref 0.3–1.2)
Total Protein: 6.5 g/dL (ref 6.5–8.1)

## 2018-12-23 LAB — RAPID URINE DRUG SCREEN, HOSP PERFORMED
Amphetamines: NOT DETECTED
Barbiturates: NOT DETECTED
Benzodiazepines: NOT DETECTED
Cocaine: NOT DETECTED
Opiates: NOT DETECTED
Tetrahydrocannabinol: NOT DETECTED

## 2018-12-23 LAB — C-REACTIVE PROTEIN
CRP: 5.6 mg/dL — ABNORMAL HIGH (ref <1.0)
CRP: 7.5 mg/dL — ABNORMAL HIGH (ref <1.0)

## 2018-12-23 LAB — HEPARIN LEVEL (UNFRACTIONATED)
Heparin Unfractionated: 0.26 IU/mL — ABNORMAL LOW (ref 0.30–0.70)
Heparin Unfractionated: 0.41 IU/mL (ref 0.30–0.70)
Heparin Unfractionated: 0.21 IU/mL — ABNORMAL LOW (ref 0.30–0.70)

## 2018-12-23 LAB — ECHOCARDIOGRAM LIMITED
Height: 66 in
Weight: 1943.58 oz

## 2018-12-23 LAB — GLUCOSE, CAPILLARY: Glucose-Capillary: 106 mg/dL — ABNORMAL HIGH (ref 70–99)

## 2018-12-23 LAB — HIV ANTIBODY (ROUTINE TESTING W REFLEX): HIV Screen 4th Generation wRfx: NONREACTIVE

## 2018-12-23 LAB — PROCALCITONIN: Procalcitonin: 0.15 ng/mL

## 2018-12-23 MED ORDER — DILTIAZEM HCL 30 MG PO TABS
30.0000 mg | ORAL_TABLET | Freq: Four times a day (QID) | ORAL | Status: DC
  Administered 2018-12-23 – 2018-12-24 (×2): 30 mg via ORAL
  Filled 2018-12-23 (×2): qty 1

## 2018-12-23 MED ORDER — SODIUM CHLORIDE 0.9 % IV SOLN
1.0000 g | INTRAVENOUS | Status: DC
  Filled 2018-12-23 (×2): qty 10

## 2018-12-23 MED ORDER — SODIUM CHLORIDE 0.9 % IV SOLN
500.0000 mg | INTRAVENOUS | Status: DC
  Filled 2018-12-23 (×2): qty 500

## 2018-12-23 MED ORDER — LEVALBUTEROL HCL 1.25 MG/0.5ML IN NEBU: 1.2500 mg | INHALATION_SOLUTION | Freq: Three times a day (TID) | RESPIRATORY_TRACT | Status: DC

## 2018-12-23 MED ORDER — DILTIAZEM HCL 30 MG PO TABS
30.0000 mg | ORAL_TABLET | Freq: Four times a day (QID) | ORAL | Status: DC
  Administered 2018-12-23: 30 mg via ORAL
  Filled 2018-12-23: qty 1

## 2018-12-23 MED ORDER — LEVALBUTEROL HCL 1.25 MG/0.5ML IN NEBU
1.2500 mg | INHALATION_SOLUTION | Freq: Three times a day (TID) | RESPIRATORY_TRACT | Status: DC
  Administered 2018-12-23 – 2018-12-25 (×6): 1.25 mg via RESPIRATORY_TRACT
  Filled 2018-12-23 (×6): qty 0.5

## 2018-12-23 MED ORDER — POTASSIUM CHLORIDE CRYS ER 20 MEQ PO TBCR
40.0000 meq | EXTENDED_RELEASE_TABLET | Freq: Once | ORAL | Status: AC
  Administered 2018-12-23: 40 meq via ORAL
  Filled 2018-12-23: qty 2

## 2018-12-23 NOTE — Progress Notes (Signed)
[facility]   Reason for visit: Follow up on pulmonary nodules  Interval History: no changes, no fever, no sob, no cough; no n/v/d.   WBC up to 23.6.   Starting azithromycin and ceftriaxone per primary team  Physical Exam: Constitutional:  Vitals:   12/23/18 0420 12/23/18 0800  BP:  117/90  Pulse: (!) 45 99  Resp: (!) 21 19  Temp:  (!) 97.4 F (36.3 C)  SpO2: 95% (!) 89%   patient appears in NAD Eyes: anicteric HENT: no thrush Respiratory: mild tachypnea; CTA B Cardiovascular: irr irr GI: soft, nt, nd  Review of Systems: Constitutional: negative for fevers, chills, sweats and anorexia Respiratory: negative for dyspnea on exertion Gastrointestinal: negative for nausea and diarrhea  Lab Results  Component Value Date   WBC 23.6 (H) 12/23/2018   HGB 12.9 12/23/2018   HCT 38.2 12/23/2018   MCV 95.7 12/23/2018   PLT 420 (H) 12/23/2018    Lab Results  Component Value Date   CREATININE 0.56 12/23/2018   BUN 7 (L) 12/23/2018   NA 138 12/23/2018   K 3.4 (L) 12/23/2018   CL 96 (L) 12/23/2018   CO2 32 12/23/2018    Lab Results  Component Value Date   ALT 36 12/23/2018   AST 27 12/23/2018   ALKPHOS 123 12/23/2018     Microbiology: Recent Results (from the past 240 hour(s))  Culture, blood (Routine X 2) w Reflex to ID Panel     Status: None (Preliminary result)   Collection Time: 12/22/18 11:40 AM  Result Value Ref Range Status   Specimen Description BLOOD RIGHT ANTECUBITAL  Final   Special Requests   Final    BOTTLES DRAWN AEROBIC ONLY Blood Culture results may not be optimal due to an inadequate volume of blood received in culture bottles   Culture   Final    NO GROWTH < 24 HOURS Performed at [facility], [address], [place], [place] [address]    Report Status PENDING  Incomplete  Culture, blood (Routine X 2) w Reflex to ID Panel     Status: None (Preliminary result)   Collection Time: 12/22/18 11:45 AM  Result Value  Ref Range Status   Specimen Description BLOOD RIGHT ANTECUBITAL  Final   Special Requests   Final    BOTTLES DRAWN AEROBIC ONLY Blood Culture adequate volume   Culture   Final    NO GROWTH < 24 HOURS Performed at [facility], [address] [place], [place] [address]    Report Status PENDING  Incomplete  SARS Coronavirus 2 [facility] order, Performed in [facility] hospital lab)     Status: None   Collection Time: 12/22/18  2:58 PM  Result Value Ref Range Status   SARS Coronavirus 2 NEGATIVE NEGATIVE Final    Comment: (NOTE) If result is NEGATIVE SARS-CoV-2 target nucleic acids are NOT DETECTED. The SARS-CoV-2 RNA is generally detectable in upper and lower  respiratory specimens during the acute phase of infection. The lowest  concentration of SARS-CoV-2 viral copies this assay can detect is 250  copies / mL. A negative result does not preclude SARS-CoV-2 infection  and should not be used as the sole basis for treatment or other  patient management decisions.  A negative result may occur with  improper specimen collection / handling, submission of specimen other  than nasopharyngeal swab, presence of viral mutation(s) within the  areas targeted by this assay, and inadequate  number of viral copies  (<250 copies / mL). A negative result must be combined with clinical  observations, patient history, and epidemiological information. If result is POSITIVE SARS-CoV-2 target nucleic acids are DETECTED. The SARS-CoV-2 RNA is generally detectable in upper and lower  respiratory specimens dur ing the acute phase of infection.  Positive  results are indicative of active infection with SARS-CoV-2.  Clinical  correlation with patient history and other diagnostic information is  necessary to determine patient infection status.  Positive results do  not rule out bacterial infection or co-infection with other viruses. If result is PRESUMPTIVE POSTIVE SARS-CoV-2 nucleic acids MAY BE  PRESENT.   A presumptive positive result was obtained on the submitted specimen  and confirmed on repeat testing.  While 2019 novel coronavirus  (SARS-CoV-2) nucleic acids may be present in the submitted sample  additional confirmatory testing may be necessary for epidemiological  and / or clinical management purposes  to differentiate between  SARS-CoV-2 and other Sarbecovirus currently known to infect humans.  If clinically indicated additional testing with an alternate test  methodology [phone]([id number]) is advised. The SARS-CoV-2 RNA is generally  detectable in upper and lower respiratory sp ecimens during the acute  phase of infection. The expected result is Negative. Fact Sheet for Patients:  BoilerBrush.com.cyhttps://www.fda.gov/media/136312/download Fact Sheet for Healthcare Providers: https://pope.com/https://www.fda.gov/media/136313/download This test is not yet approved or cleared by the [place] FDA and has been authorized for detection and/or diagnosis of SARS-CoV-2 by FDA under an Emergency Use Authorization (EUA).  This EUA will remain in effect (meaning this test can be used) for the duration of the COVID-19 declaration under Section 564(b)(1) of the Act, 21 U.S.C. section 360bbb-3(b)(1), unless the authorization is terminated or revoked sooner. Performed at [facility], [address] [place], [place] [address]     Impression/Plan:  1. SOB - patient denies any symptoms of sob or DOE.  She is though breathing a bit faster than normal.  CT with some emphysema, tree-in-bud opacities.  Procalcitonin wnl.  CXR with some interstitial markings. No signs of bacterial infection so will stop antibiotics.  Not c/w Covid but second test ordered.   I will stop antibiotics  2. New afib - tachycardic, on cardizem.    3.  Leukocytosis - elevated.  May be reactive/stress reaction with Afib.  No recent steroids.  Will continue to monitor  4.  Tree -in-bud opacities - possible chronic infection (fungal, AFB).   Will check sputum.  With emphysema, would benefit from outpatient pulmonology evaluation.

## 2018-12-23 NOTE — Progress Notes (Signed)
Faxed report received from [facility] with negative lab result for Covid-19, result placed in patients chart, NP Craige Cotta notified at 05:14 on 12/23/2018.

## 2018-12-23 NOTE — Progress Notes (Signed)
Explained to pt her Covid test at [facility] was negative so now we have 2 negative tests which means she can come out of Isolation and be moved into a regular room as soon as the doctor verifies the results (results come from [facility]) and the doctor writes the order that she can be moved to another rm. Pt stated then I get to go home. I explained she had to stay because we were trying to get her heart back in normal rhythm to reduce the chance of blood clots and the Heparin she's on needs to be monitored for the same reason. Explained the abnormal heart rate and rhythm can cause the rapid breathing and congestion in her lungs or the rapid breathing and congestion in her lungs can cause the change in her heart but we need to get all of them controlled and her lungs not so congested sounding. Pt voiced understanding.

## 2018-12-23 NOTE — Progress Notes (Signed)
Resp rates continue to be above 28 and mostly above 30 at rest. O2 sat 88-91% on rm air most of the time with good pleth. Rm audible rhonchi present now. Started on 2 L/M [place]

## 2018-12-23 NOTE — Progress Notes (Signed)
Patient Demographics:    Carrie Case, is a 63 y.o. female, DOB - 1955-09-04, ZOX:[id number]  Admit date - 12/22/2018   Admitting Physician Jonah Blue, MD  Outpatient Primary MD for the patient is Patient, No Pcp Per  LOS - 1   No chief complaint on file.       Subjective:    Rudean Curt today has no fevers, no emesis,  No chest pain, shortness of breath, cough and hypoxia persist  Assessment  & Plan :    Principal Problem:   Acute respiratory failure with hypoxia (HCC) Active Problems:   Tobacco dependence   Atrial fibrillation with RVR (HCC)  Brief Summary- Year-old female without significant past medical history except for tobacco abuse presents from [facility] with shortness of breath, hypoxia and new onset atrial fibrillation  A/p 1)Atypical Lung infection----cough, congestion and hypoxia persist,  Repeat CXR on 12/23/2018 with Diffuse coarse interstitial opacities,discussed with Dr. Luciana Axe  from ID,  COVID 19 Neg, lactic acid is not elevated, procalcitonin is not elevated.... Leukocytosis and elevated CRP noted and discussed with infectious disease specialist----he advised against antibiotics at this time, ID has ordered Fungal cultures.  Blood cultures NGTD..... Patient remains afebrile, hypoxia noted, give Xopenex and mucolytics  2)New Onset AFib----   okay to transition from IV Cardizem to p.o. Cardizem for rate control, however if EF on echo is Low then switch to metoprolol for rate control rather than Cardizem TSH 1.2, echo pending. This patients CHA2DS2-VASc Score and unadjusted Ischemic Stroke Rate (% per year) is equal to 0.6 % stroke rate/year from a score of 1 (gender),----- currently on IV heparin, chads vascular score as noted above-----upon discharge aspirin may suffice, then further conversations with patient regarding risk versus benefit of full anticoagulation prior to  discharge  3)Tobacco Abuse--- patient smokes 1 to 2 packs/day , smoking cessation advised, continue nicotine patch  4)Acute hypoxic respiratory failure----treat as above #1,,, try to wean off O2  Disposition/Need for in-Hospital Stay- patient unable to be discharged at this time due to acute hypoxic respiratory failure and new onset atrial fibrillation requiring IV Cardizem drip for rate control on IV heparin for stroke prophylaxis --- patient remains pretty symptomatic--- shortness of breath, cough and hypoxia persist   Code Status : Full  Family Communication:   na   Disposition Plan  : home once Heart rate is controlled and infectious process has been stabilized  Consults  :  ID  DVT Prophylaxis  : IV heparin  Lab Results  Component Value Date   PLT 420 (H) 12/23/2018    Inpatient Medications  Scheduled Meds:  aspirin EC  81 mg Oral Daily   diltiazem  30 mg Oral Q6H   docusate sodium  100 mg Oral BID   levalbuterol  1.25 mg Nebulization TID   nicotine  21 mg Transdermal Daily   Continuous Infusions:  diltiazem (CARDIZEM) infusion 13 mg/hr (12/23/18 0822)   heparin 1,200 Units/hr (12/23/18 0822)   PRN Meds:.acetaminophen, albuterol, bisacodyl, ondansetron **OR** ondansetron (ZOFRAN) IV, oxyCODONE, polyethylene glycol    Anti-infectives (From admission, onward)   Start     Dose/Rate Route Frequency Ordered Stop   12/23/18 0900  cefTRIAXone (ROCEPHIN) 1 g in sodium chloride 0.9 % 100  mL IVPB  Status:  Discontinued     1 g 200 mL/hr over 30 Minutes Intravenous Every 24 hours 12/23/18 0824 12/23/18 1105   12/23/18 0900  azithromycin (ZITHROMAX) 500 mg in sodium chloride 0.9 % 250 mL IVPB  Status:  Discontinued     500 mg 250 mL/hr over 60 Minutes Intravenous Every 24 hours 12/23/18 0824 12/23/18 1105        Objective:   Vitals:   12/23/18 0418 12/23/18 0419 12/23/18 0420 12/23/18 0800  BP:    117/90  Pulse: 67 (!) 51 (!) 45 99  Resp: 20 (!) 25 (!) 21 19   Temp:    (!) 97.4 F (36.3 C)  TempSrc:    Oral  SpO2: 95% 95% 95% (!) 89%  Weight:    55.1 kg  Height:        Wt Readings from Last 3 Encounters:  12/23/18 55.1 kg  01/08/15 72.6 kg  07/05/13 46.7 kg     Intake/Output Summary (Last 24 hours) at 12/23/2018 1410 Last data filed at 12/23/2018 7858 Gross per 24 hour  Intake 1165 ml  Output 200 ml  Net 965 ml     Physical Exam Patient is examined daily including today on 12/23/18 , exams remain the same as of yesterday except that has changed   Gen:- Awake Alert,  In no apparent distress  HEENT:- [place].AT, No sclera icterus Neck-Supple Neck,No JVD,.  Lungs-diminished in bases, few scattered rhonchi,  CV- S1, S2 normal, irregularly irregular  abd-  +ve B.Sounds, Abd Soft, No tenderness,    Extremity/Skin:- No  edema, pedal pulses present  Psych-affect is appropriate, oriented x3 Neuro-no new focal deficits, no tremors   Data Review:   Micro Results Recent Results (from the past 240 hour(s))  Culture, blood (Routine X 2) w Reflex to ID Panel     Status: None (Preliminary result)   Collection Time: 12/22/18 11:40 AM  Result Value Ref Range Status   Specimen Description BLOOD RIGHT ANTECUBITAL  Final   Special Requests   Final    BOTTLES DRAWN AEROBIC ONLY Blood Culture results may not be optimal due to an inadequate volume of blood received in culture bottles   Culture   Final    NO GROWTH < 24 HOURS Performed at [facility], [address] [place], [place] [address]    Report Status PENDING  Incomplete  Culture, blood (Routine X 2) w Reflex to ID Panel     Status: None (Preliminary result)   Collection Time: 12/22/18 11:45 AM  Result Value Ref Range Status   Specimen Description BLOOD RIGHT ANTECUBITAL  Final   Special Requests   Final    BOTTLES DRAWN AEROBIC ONLY Blood Culture adequate volume   Culture   Final    NO GROWTH < 24 HOURS Performed at [facility], [address], [place], [place] [address]     Report Status PENDING  Incomplete  SARS Coronavirus 2 [facility] order, Performed in [facility] hospital lab)     Status: None   Collection Time: 12/22/18  2:58 PM  Result Value Ref Range Status   SARS Coronavirus 2 NEGATIVE NEGATIVE Final    Comment: (NOTE) If result is NEGATIVE SARS-CoV-2 target nucleic acids are NOT DETECTED. The SARS-CoV-2 RNA is generally detectable in upper and lower  respiratory specimens during the acute phase of infection. The lowest  concentration of SARS-CoV-2 viral copies this assay can detect is 250  copies /  mL. A negative result does not preclude SARS-CoV-2 infection  and should not be used as the sole basis for treatment or other  patient management decisions.  A negative result may occur with  improper specimen collection / handling, submission of specimen other  than nasopharyngeal swab, presence of viral mutation(s) within the  areas targeted by this assay, and inadequate number of viral copies  (<250 copies / mL). A negative result must be combined with clinical  observations, patient history, and epidemiological information. If result is POSITIVE SARS-CoV-2 target nucleic acids are DETECTED. The SARS-CoV-2 RNA is generally detectable in upper and lower  respiratory specimens dur ing the acute phase of infection.  Positive  results are indicative of active infection with SARS-CoV-2.  Clinical  correlation with patient history and other diagnostic information is  necessary to determine patient infection status.  Positive results do  not rule out bacterial infection or co-infection with other viruses. If result is PRESUMPTIVE POSTIVE SARS-CoV-2 nucleic acids MAY BE PRESENT.   A presumptive positive result was obtained on the submitted specimen  and confirmed on repeat testing.  While 2019 novel coronavirus  (SARS-CoV-2) nucleic acids may be present in the submitted sample  additional confirmatory testing may be necessary for epidemiological  and  / or clinical management purposes  to differentiate between  SARS-CoV-2 and other Sarbecovirus currently known to infect humans.  If clinically indicated additional testing with an alternate test  methodology [phone]([id number]) is advised. The SARS-CoV-2 RNA is generally  detectable in upper and lower respiratory sp ecimens during the acute  phase of infection. The expected result is Negative. Fact Sheet for Patients:  BoilerBrush.com.cyhttps://www.fda.gov/media/136312/download Fact Sheet for Healthcare Providers: https://pope.com/https://www.fda.gov/media/136313/download This test is not yet approved or cleared by the [place] FDA and has been authorized for detection and/or diagnosis of SARS-CoV-2 by FDA under an Emergency Use Authorization (EUA).  This EUA will remain in effect (meaning this test can be used) for the duration of the COVID-19 declaration under Section 564(b)(1) of the Act, 21 U.S.C. section 360bbb-3(b)(1), unless the authorization is terminated or revoked sooner. Performed at [facility], [address] [place], [place] [address]     Radiology Reports Dg Chest [place]  Result Date: 12/23/2018 CLINICAL DATA:  63 y/o  F; bronchi and tachypnea. EXAM: PORTABLE CHEST 1 VIEW COMPARISON:  07/05/2013 chest radiograph. FINDINGS: Stable cardiac silhouette within normal limits given projection technique. Aortic calcific atherosclerosis. Coarse interstitial opacities are present diffusely throughout the lungs. No consolidation, effusion, or pneumothorax. No acute osseous abnormality is evident. IMPRESSION: Diffuse coarse interstitial opacities, suspected background of interstitial lung disease. Acute atypical pneumonia or bronchitis may be present. No focal consolidation. Electronically Signed   By: Mitzi HansenLance  Furusawa-Stratton M.D.   On: 12/23/2018 01:02     CBC Recent Labs  Lab 12/22/18 1143 12/23/18 0633  WBC 18.8* 23.6*  HGB 13.3 12.9  HCT 40.0 38.2  PLT 381 420*  MCV 97.8 95.7  MCH 32.5 32.3    MCHC 33.3 33.8  RDW 13.8 13.5  LYMPHSABS  --  2.9  MONOABS  --  1.4*  EOSABS  --  0.2  BASOSABS  --  0.1    Chemistries  Recent Labs  Lab 12/22/18 1143 12/23/18 0633  NA 141 138  K 4.0 3.4*  CL 105 96*  CO2 27 32  GLUCOSE 109* 115*  BUN 21 7*  CREATININE 0.62 0.56  CALCIUM 9.7 9.9  AST  --  27  ALT  --  36  ALKPHOS  --  123  BILITOT  --  0.4   ------------------------------------------------------------------------------------------------------------------ Recent Labs    12/23/18 0633  CHOL 125  HDL 26*  LDLCALC 61  TRIG 161*  CHOLHDL 4.8    Lab Results  Component Value Date   HGBA1C 5.6 12/22/2018   ------------------------------------------------------------------------------------------------------------------ Recent Labs    12/22/18 1143  TSH 1.246   ------------------------------------------------------------------------------------------------------------------ Recent Labs    12/22/18 1143  FERRITIN 224    Coagulation profile No results for input(s): INR, PROTIME in the last 168 hours.  Recent Labs    12/22/18 1445  DDIMER 2.87*    Cardiac Enzymes No results for input(s): CKMB, TROPONINI, MYOGLOBIN in the last 168 hours.  Invalid input(s): CK ------------------------------------------------------------------------------------------------------------------ No results found for: BNP   Shon Hale M.D on 12/23/2018 at 2:10 PM  Go to [email or web address] - for contact info  [facility] - Office  [phone]

## 2018-12-23 NOTE — Progress Notes (Addendum)
Discussed with Telemetry Tech the parameters of pt's orders r/t being on Cardizem drip. Explained per orders HR 65-90 consistant, changing to NSR or another arrhythmia, SBP less than 90, pauses greater than 2 seconds, or sxs of MI  needs to be called so drip can be changed. Stated note applied to monitor.

## 2018-12-23 NOTE — Progress Notes (Signed)
  Echocardiogram 2D Echocardiogram has been performed.  [name] 12/23/2018, 2:48 PM

## 2018-12-23 NOTE — Progress Notes (Signed)
ANTICOAGULATION CONSULT NOTE  Pharmacy Consult for heparin Indication: atrial fibrillation   Patient Measurements: Height: 5\' 6"  (167.6 cm) Weight: 121 lb 7.6 oz (55.1 kg) IBW/kg (Calculated) : 59.3 Heparin Dosing Weight: actual body weight   Vital Signs: Temp: 97.9 F (36.6 C) (05/05 1457) Temp Source: Axillary (05/05 1457) BP: 120/81 (05/05 1457) Pulse Rate: 100 (05/05 1457)  Labs: Recent Labs    12/22/18 1143 12/22/18 2031 12/23/18 0633 12/23/18 1417  HGB 13.3  --  12.9  --   HCT 40.0  --  38.2  --   PLT 381  --  420*  --   HEPARINUNFRC  --  0.28* 0.26* 0.41  CREATININE 0.62  --  0.56  --      Assessment: 63 yo female admitted with dyspnea, found to be in a-fib. Starting heparin infusion.   Today, 12/23/18:  1417 heparin level = 0.41 units/mL, now therapeutic on heparin infusion at 1200 units/hr  CBC: Hgb 12.9, Pltc elevated at 420K  No bleeding or complications of therapy noted per nursing   Goal of Therapy:  Heparin level 0.3-0.7 units/ml Monitor platelets by anticoagulation protocol: Yes    Plan:   Continue heparin infusion at 1200 units/hr  Heparin level at 2100 to confirm remains within therapeutic range  Daily CBC and heparin level  Monitor closely for s/sx of bleeding   Greer Pickerel, PharmD, BCPS Pager: [phone] 12/23/2018 3:48 PM

## 2018-12-23 NOTE — Progress Notes (Signed)
Elevated RR above 28 when moving about, going to BR, or with CXR movement.

## 2018-12-23 NOTE — Progress Notes (Signed)
ANTICOAGULATION CONSULT NOTE - Follow Up Consult  Pharmacy Consult for heparin Indication: atrial fibrillation  Labs: Recent Labs    12/22/18 1143 12/22/18 2031 12/23/18 0633  HGB 13.3  --  12.9  HCT 40.0  --  38.2  PLT 381  --  420*  HEPARINUNFRC  --  0.28* 0.26*  CREATININE 0.62  --   --     Assessment: 63yo female remains subtherapeutic on heparin with lower heparin level despite rate increase, initial level likely higher d/t large bolus with low weight; no gtt issues or signs of bleeding per RN.  Goal of Therapy:  Heparin level 0.3-0.7 units/ml   Plan:  Will increase heparin gtt by 2 units/kg/hr to 1200 units/hr and check level in 6 hours.    Vernard Gambles, PharmD, BCPS  12/23/2018,7:39 AM

## 2018-12-23 NOTE — Progress Notes (Addendum)
This RN spoke with [facility] ED Charge RN, received verbal confirmation that her COVID-19 test that they performed was not detected, a negative result. NP Craige Cotta notified via text page at 03:55 on 12/23/2018, patient signed release of health care protected information sheet to get lab result report faxed over from [facility] to have confirmation of negative test result.

## 2018-12-23 NOTE — Progress Notes (Addendum)
ANTICOAGULATION CONSULT NOTE  Pharmacy Consult for heparin Indication: atrial fibrillation   Patient Measurements: Height: 5\' 6"  (167.6 cm) Weight: 121 lb 7.6 oz (55.1 kg) IBW/kg (Calculated) : 59.3 Heparin Dosing Weight: 55 kg  Vital Signs: Temp: 97.9 F (36.6 C) (05/05 1457) Temp Source: Axillary (05/05 1457) BP: 120/81 (05/05 1457) Pulse Rate: 100 (05/05 1457)  Labs: Recent Labs    12/22/18 1143  12/23/18 0633 12/23/18 1417 12/23/18 2057  HGB 13.3  --  12.9  --   --   HCT 40.0  --  38.2  --   --   PLT 381  --  420*  --   --   HEPARINUNFRC  --    < > 0.26* 0.41 0.21*  CREATININE 0.62  --  0.56  --   --    < > = values in this interval not displayed.     Assessment: 63 yo female admitted with dyspnea, found to be in a-fib. Pharmacy consulted to dose IV heparin.   Confirmatory heparin level is sub-therapeutic.  No issue with heparin infusion per RN.  No bleeding reported.  Goal of Therapy:  Heparin level 0.3-0.7 units/ml Monitor platelets by anticoagulation protocol: Yes   Plan:  Increase heparin gtt to 1300 units/hr F/U AM labs  Carrie Case D. Laney Potash, PharmD, BCPS, BCCCP 12/23/2018, 9:26 PM

## 2018-12-24 DIAGNOSIS — R0602 Shortness of breath: Secondary | ICD-10-CM

## 2018-12-24 LAB — CBC WITH DIFFERENTIAL/PLATELET
Abs Immature Granulocytes: 0.93 10*3/uL — ABNORMAL HIGH (ref 0.00–0.07)
Basophils Absolute: 0.1 10*3/uL (ref 0.0–0.1)
Basophils Relative: 1 %
Eosinophils Absolute: 0.1 10*3/uL (ref 0.0–0.5)
Eosinophils Relative: 0 %
HCT: 40.9 % (ref 36.0–46.0)
Hemoglobin: 13.8 g/dL (ref 12.0–15.0)
Immature Granulocytes: 5 %
Lymphocytes Relative: 14 %
Lymphs Abs: 2.7 10*3/uL (ref 0.7–4.0)
MCH: 32.2 pg (ref 26.0–34.0)
MCHC: 33.7 g/dL (ref 30.0–36.0)
MCV: 95.3 fL (ref 80.0–100.0)
Monocytes Absolute: 1.2 10*3/uL — ABNORMAL HIGH (ref 0.1–1.0)
Monocytes Relative: 6 %
Neutro Abs: 14.5 10*3/uL — ABNORMAL HIGH (ref 1.7–7.7)
Neutrophils Relative %: 74 %
Platelets: 396 10*3/uL (ref 150–400)
RBC: 4.29 MIL/uL (ref 3.87–5.11)
RDW: 13.6 % (ref 11.5–15.5)
WBC: 19.5 10*3/uL — ABNORMAL HIGH (ref 4.0–10.5)
nRBC: 0.2 % (ref 0.0–0.2)

## 2018-12-24 LAB — TROPONIN I: Troponin I: <0.03 ng/mL (ref <0.03)

## 2018-12-24 LAB — MAGNESIUM: Magnesium: 1.7 mg/dL (ref 1.7–2.4)

## 2018-12-24 LAB — COMPREHENSIVE METABOLIC PANEL
ALT: 32 U/L (ref 0–44)
ALT: 37 U/L (ref 0–44)
AST: 34 U/L (ref 15–41)
AST: 39 U/L (ref 15–41)
Albumin: 2.4 g/dL — ABNORMAL LOW (ref 3.5–5.0)
Albumin: 2.5 g/dL — ABNORMAL LOW (ref 3.5–5.0)
Alkaline Phosphatase: 117 U/L (ref 38–126)
Alkaline Phosphatase: 108 U/L (ref 38–126)
Anion gap: 11 (ref 5–15)
Anion gap: 10 (ref 5–15)
BUN: 9 mg/dL (ref 8–23)
BUN: 10 mg/dL (ref 8–23)
CO2: 29 mmol/L (ref 22–32)
CO2: 31 mmol/L (ref 22–32)
Calcium: 10.1 mg/dL (ref 8.9–10.3)
Calcium: 9.7 mg/dL (ref 8.9–10.3)
Chloride: 100 mmol/L (ref 98–111)
Chloride: 98 mmol/L (ref 98–111)
Creatinine, Ser: 0.61 mg/dL (ref 0.44–1.00)
Creatinine, Ser: 0.63 mg/dL (ref 0.44–1.00)
GFR calc Af Amer: >60 mL/min (ref >60)
GFR calc Af Amer: >60 mL/min (ref >60)
GFR calc non Af Amer: >60 mL/min (ref >60)
GFR calc non Af Amer: >60 mL/min (ref >60)
Glucose, Bld: 101 mg/dL — ABNORMAL HIGH (ref 70–99)
Glucose, Bld: 116 mg/dL — ABNORMAL HIGH (ref 70–99)
Potassium: 3.8 mmol/L (ref 3.5–5.1)
Potassium: 3.6 mmol/L (ref 3.5–5.1)
Sodium: 140 mmol/L (ref 135–145)
Sodium: 139 mmol/L (ref 135–145)
Total Bilirubin: 0.6 mg/dL (ref 0.3–1.2)
Total Bilirubin: 0.3 mg/dL (ref 0.3–1.2)
Total Protein: 6.5 g/dL (ref 6.5–8.1)
Total Protein: 6.6 g/dL (ref 6.5–8.1)

## 2018-12-24 LAB — HEPARIN LEVEL (UNFRACTIONATED): Heparin Unfractionated: 0.36 IU/mL (ref 0.30–0.70)

## 2018-12-24 LAB — GLUCOSE, CAPILLARY: Glucose-Capillary: 124 mg/dL — ABNORMAL HIGH (ref 70–99)

## 2018-12-24 LAB — C-REACTIVE PROTEIN
CRP: 9.4 mg/dL — ABNORMAL HIGH (ref <1.0)
CRP: 9.3 mg/dL — ABNORMAL HIGH (ref <1.0)

## 2018-12-24 LAB — BRAIN NATRIURETIC PEPTIDE: B Natriuretic Peptide: 347 pg/mL — ABNORMAL HIGH (ref 0.0–100.0)

## 2018-12-24 MED ORDER — DILTIAZEM HCL 25 MG/5ML IV SOLN
10.0000 mg | Freq: Once | INTRAVENOUS | Status: AC
  Administered 2018-12-24: 10 mg via INTRAVENOUS
  Filled 2018-12-24: qty 5

## 2018-12-24 MED ORDER — DILTIAZEM HCL 60 MG PO TABS
90.0000 mg | ORAL_TABLET | Freq: Four times a day (QID) | ORAL | Status: DC
  Administered 2018-12-24 – 2018-12-25 (×3): 90 mg via ORAL
  Filled 2018-12-24 (×4): qty 1

## 2018-12-24 MED ORDER — DILTIAZEM HCL 25 MG/5ML IV SOLN
5.0000 mg | Freq: Once | INTRAVENOUS | Status: AC
  Administered 2018-12-24: 5 mg via INTRAVENOUS
  Filled 2018-12-24: qty 5

## 2018-12-24 MED ORDER — FUROSEMIDE 10 MG/ML IJ SOLN
40.0000 mg | Freq: Once | INTRAMUSCULAR | Status: AC
  Administered 2018-12-24: 40 mg via INTRAVENOUS
  Filled 2018-12-24: qty 4

## 2018-12-24 MED ORDER — DILTIAZEM HCL 30 MG PO TABS
30.0000 mg | ORAL_TABLET | Freq: Once | ORAL | Status: AC
  Administered 2018-12-24: 30 mg via ORAL
  Filled 2018-12-24: qty 1

## 2018-12-24 MED ORDER — DILTIAZEM HCL 60 MG PO TABS
60.0000 mg | ORAL_TABLET | Freq: Four times a day (QID) | ORAL | Status: DC
  Administered 2018-12-24: 60 mg via ORAL
  Filled 2018-12-24: qty 1

## 2018-12-24 NOTE — TOC Initial Note (Addendum)
Transition of Care [facility]) - Initial/Assessment Note    Patient Details  Name: Carrie Case MRN: [id number] Date of Birth: 10-01-1955  Transition of Care [facility]) CM/SW Contact:    Leone Haven, RN Phone Number: 12/24/2018, 3:47 PM  Clinical Narrative:                 From home with relative, acute resp failure, covid negative, , she will need ast with medications, Match Letter done, NCM contacted TOC to fill meds prior to dc , patient has 20.00 that she can use to pay for meds.  She states her daughter will transport her home tomorrow,  Also will get apt with clinic for follow up.  She may need home oxygen, e mRN will recheck sats in thec morning, she also needs HHPT  For charity, [facility] is able to take for charity.  Will need HHPT orders also.  5/7 [name] RN, BSN - NCM received call from [place] with [facility] , stating [facility] declined and [facility] will be taking patient for charity for HHPT.  Patient needs follow up apt scheduled. Patient does not need home oxygen.   Per pt eval today  patient does not need HHPT, so [facility] will not be providing this for charity.   Expected Discharge Plan: Home w Home Health Services Barriers to Discharge: Inadequate or no insurance   Patient Goals and CMS Choice Patient states their goals for this hospitalization and ongoing recovery are:: to go home CMS Medicare.gov Compare Post Acute Care list provided to:: Patient Choice offered to / list presented to : Patient  Expected Discharge Plan and Services Expected Discharge Plan: Home w Home Health Services In-house Referral: Artist Discharge Planning Services: CM Consult, Medication Assistance, Indigent Health Clinic, Follow-up appt scheduled, MATCH Program Post Acute Care Choice: Home Health Living arrangements for the past 2 months: Single Family Home                 DME Arranged: N/A         HH Arranged: PT HH [facility]: [facility]         Prior Living Arrangements/Services Living arrangements for the past 2 months: Single Family Home Lives with:: Relatives Patient language and need for interpreter reviewed:: Yes Do you feel safe going back to the place where you live?: Yes      Need for Family Participation in Patient Care: No (Comment) Care giver support system in place?: No (comment)   Criminal Activity/Legal Involvement Pertinent to Current Situation/Hospitalization: No - Comment as needed  Activities of Daily Living      Permission Sought/Granted                  Emotional Assessment   Attitude/Demeanor/Rapport: Engaged Affect (typically observed): Appropriate Orientation: : Oriented to Self, Oriented to Place, Oriented to  Time, Oriented to Situation Alcohol / Substance Use: Tobacco Use Psych Involvement: No (comment)  Admission diagnosis:  HYPOXIA POSSIBLE COVID- RESULT PENDING A FIB Patient Active Problem List   Diagnosis Date Noted  . Acute respiratory failure with hypoxia (HCC) 12/22/2018  . Tobacco dependence 12/22/2018  . Atrial fibrillation with RVR (HCC) 12/22/2018   PCP:  Patient, No Pcp Per Pharmacy:   [id number] - [place], [place] - [address] [place] [address] Phone: [phone] Fax: [phone]     Social Determinants of Health (SDOH) Interventions  Readmission Risk Interventions Readmission Risk Prevention Plan 12/24/2018  Transportation Screening Complete  Some recent data might be hidden

## 2018-12-24 NOTE — Evaluation (Signed)
Physical Therapy Evaluation Patient Details Name: Carrie Case MRN: [id number] DOB: Jul 19, 1956 Today's Date: 12/24/2018   History of Present Illness  Pt is a 63 y/o female admitted secondary to SOB with new onset a-fib with RVR. Pt required Cardizem drip. COVID19 testing was negative. No pertinent PMH.    Clinical Impression  Pt presented attempting to exit her bed to get to her [facility] with alarm going off upon arrival. Prior to admission, pt reported that she was independent with all functional mobility and ADLs. Pt lives with a roommate in a single level home with a flight of stairs to enter. At the time of evaluation, pt very impulsive and limited secondary to fluctuating HR from low 110's to as high as 156 bpm with minimal activity. Pt would continue to benefit from skilled physical therapy services at this time while admitted and after d/c to address the below listed limitations in order to improve overall safety and independence with functional mobility.     Follow Up Recommendations Home health PT    Equipment Recommendations  None recommended by PT    Recommendations for Other Services       Precautions / Restrictions Precautions Precautions: Fall Precaution Comments: monitor HR and SPO2 Restrictions Weight Bearing Restrictions: No      Mobility  Bed Mobility Overal bed mobility: Modified Independent                Transfers Overall transfer level: Needs assistance Equipment used: None Transfers: Sit to/from Stand;Stand Pivot Transfers Sit to Stand: Min guard;Min assist Stand pivot transfers: Min guard       General transfer comment: pt very impulsive and exiting bed upon arrival; min A for stability initially, progressing to min guard  Ambulation/Gait             General Gait Details: deferred as pt's HR fluctuating from low 110's to as high as 156 bpm with minimal activity  Stairs            Wheelchair Mobility    Modified Rankin (Stroke  Patients Only)       Balance Overall balance assessment: Needs assistance Sitting-balance support: Feet supported Sitting balance-Leahy Scale: Good     Standing balance support: During functional activity;No upper extremity supported Standing balance-Leahy Scale: Fair                               Pertinent Vitals/Pain Pain Assessment: No/denies pain    Home Living Family/patient expects to be discharged to:: Private residence Living Arrangements: Non-relatives/Friends Available Help at Discharge: Family;Friend(s);Available 24 hours/day   Home Access: Stairs to enter Entrance Stairs-Rails: Right;Left Entrance Stairs-Number of Steps: 16 Home Layout: One level Home Equipment: None      Prior Function Level of Independence: Independent               Hand Dominance        Extremity/Trunk Assessment   Upper Extremity Assessment Upper Extremity Assessment: Overall WFL for tasks assessed    Lower Extremity Assessment Lower Extremity Assessment: Overall WFL for tasks assessed       Communication   Communication: No difficulties  Cognition Arousal/Alertness: Awake/alert Behavior During Therapy: Impulsive Overall Cognitive Status: Impaired/Different from baseline Area of Impairment: Safety/judgement;Problem solving                         Safety/Judgement: Decreased awareness of safety;Decreased awareness of deficits  Problem Solving: Difficulty sequencing;Requires verbal cues;Requires tactile cues        General Comments      Exercises     Assessment/Plan    PT Assessment Patient needs continued PT services  PT Problem List Cardiopulmonary status limiting activity;Decreased balance;Decreased mobility;Decreased coordination;Decreased cognition;Decreased safety awareness       PT Treatment Interventions DME instruction;Gait training;Stair training;Functional mobility training;Therapeutic activities;Therapeutic  exercise;Balance training;Neuromuscular re-education;Cognitive remediation;Patient/family education    PT Goals (Current goals can be found in the Care Plan section)  Acute Rehab PT Goals Patient Stated Goal: "to go home" PT Goal Formulation: With patient Time For Goal Achievement: 01/07/19 Potential to Achieve Goals: Good    Frequency Min 3X/week   Barriers to discharge        Co-evaluation               AM-PAC PT "6 Clicks" Mobility  Outcome Measure Help needed turning from your back to your side while in a flat bed without using bedrails?: None Help needed moving from lying on your back to sitting on the side of a flat bed without using bedrails?: None Help needed moving to and from a bed to a chair (including a wheelchair)?: A Little Help needed standing up from a chair using your arms (e.g., wheelchair or bedside chair)?: A Little Help needed to walk in hospital room?: A Little Help needed climbing 3-5 steps with a railing? : A Lot 6 Click Score: 19    End of Session   Activity Tolerance: Patient tolerated treatment well Patient left: in bed;with call bell/phone within reach;with bed alarm set Nurse Communication: Mobility status PT Visit Diagnosis: Other abnormalities of gait and mobility (R26.89)    Time: [id number] PT Time Calculation (min) (ACUTE ONLY): 15 min   Charges:   PT Evaluation $PT Eval Moderate Complexity: 1 Mod          Deborah ChalkJennifer Jermon Chalfant, PT, DPT  Acute Rehabilitation Services Pager [phone] Office [phone]    [name] 12/24/2018, 11:14 AM

## 2018-12-24 NOTE — Progress Notes (Signed)
ANTICOAGULATION CONSULT NOTE - Follow Up Consult  Pharmacy Consult for heparin Indication: atrial fibrillation  Labs: Recent Labs    12/22/18 1143  12/23/18 0633 12/23/18 1417 12/23/18 2057 12/24/18 0211 12/24/18 0454  HGB 13.3  --  12.9  --   --   --  13.8  HCT 40.0  --  38.2  --   --   --  40.9  PLT 381  --  420*  --   --   --  396  HEPARINUNFRC  --    < > 0.26* 0.41 0.21*  --  0.36  CREATININE 0.62  --  0.56  --   --   --  0.61  TROPONINI  --   --   --   --   --  <0.03  --    < > = values in this interval not displayed.    Assessment/Plan:  63yo female therapeutic on heparin after rate change. Will continue gtt at current rate and confirm stable with additional level.   Vernard Gambles, PharmD, BCPS  12/24/2018,6:42 AM

## 2018-12-24 NOTE — Progress Notes (Signed)
Notified practitioner of heart rate 130-140s

## 2018-12-24 NOTE — Progress Notes (Signed)
SATURATION QUALIFICATIONS: (This note is used to comply with regulatory documentation for home oxygen)  Patient Saturations on Room Air at Rest = 92%  Patient Saturations on Room Air while Ambulating = 84%  Patient Saturations on 2 Liters of oxygen while Ambulating = 93% Walked 50 ft    Please briefly explain why patient needs home oxygen: Shortness of breath with increased activity

## 2018-12-24 NOTE — Progress Notes (Signed)
PROGRESS NOTE    Carrie Case  ZOX:[id number] DOB: 1956-01-31 DOA: 12/22/2018 PCP: Patient, No Pcp Per   Brief Narrative:  63-year-old female without significant past medical history except for tobacco abuse presents from [facility] with shortness of breath, hypoxia and new onset atrial fibrillation.  Assessment & Plan:   Principal Problem:   Acute respiratory failure with hypoxia (HCC) Active Problems:   Tobacco dependence   Atrial fibrillation with RVR (HCC)   )Atypical Lung infection  abx d/c'd per ID, appreciate assistance.  Consider possible chronic infection.  Follow sputum.  Will need outpatient pulm follow up.  Fungal antibodies pending  Fungal sputum cx pending Blood cx NGTD COVID negative x 2 (see care everywhere for 2nd test)  2)New Onset AFib- Chadsvasc is 1, no anticoagulation at this point in time Continue PO cardizem, titrate as needed.  Bolus IV dilt as needed for sustained RVR. D dimer 2.87, per H&P pt had CTA that showed no clot Echo here with normal EF, diastolic function unable to be evaluated due to Breeley Bischof fib (see report)   3)Tobacco Abuse--- patient smokes 1 to 2 packs/day , smoking cessation advised, continue nicotine patch - encourage cessation  4)Acute hypoxic respiratory failure- pt with hypoxia with ambulation, she has elevated BNP, possible contribution of HF.  Give IV lasix and follow   DVT prophylaxis: SCD Code Status: full  Family Communication: called listed contact, no answer Disposition Plan: hopefully within 24 hours  Consultants:   ID  Procedures:  Echo IMPRESSIONS    1. The left ventricle has normal systolic function, with an ejection fraction of 60-65%. The cavity size was normal. There is mildly increased left ventricular wall thickness. Left ventricular diastolic function could not be evaluated secondary to  atrial fibrillation.  2. The right ventricle has normal systolc function. The cavity was normal. Right  ventricular systolic pressure is mildly elevated with an estimated pressure of 34.6 mmHg.  3. Left atrial size was mildly dilated.  4. The mitral valve is grossly normal. There is mild mitral annular calcification present.  5. The tricuspid valve was grossly normal.  6. The aortic valve is tricuspid Mild thickening of the aortic valve. Aortic valve regurgitation is trivial by color flow Doppler. No stenosis of the aortic valve.  7. The inferior vena cava was dilated in size with >50% respiratory variability.  8. Normal LV systolic function; mild LVH; mild LAE; mild TR; mild pulmonary hypertension.  Antimicrobials:  Anti-infectives (From admission, onward)   Start     Dose/Rate Route Frequency Ordered Stop   12/23/18 0900  cefTRIAXone (ROCEPHIN) 1 g in sodium chloride 0.9 % 100 mL IVPB  Status:  Discontinued     1 g 200 mL/hr over 30 Minutes Intravenous Every 24 hours 12/23/18 0824 12/23/18 1105   12/23/18 0900  azithromycin (ZITHROMAX) 500 mg in sodium chloride 0.9 % 250 mL IVPB  Status:  Discontinued     500 mg 250 mL/hr over 60 Minutes Intravenous Every 24 hours 12/23/18 0824 12/23/18 1105     Subjective: Feels ok, says her daughter sent her here. Still hypoxic with ambulation  Objective: Vitals:   12/24/18 0900 12/24/18 1238 12/24/18 1513 12/24/18 1634  BP:  99/72  112/83  Pulse:  86  80  Resp:  (!) 29  (!) 25  Temp:  (!) 97.5 F (36.4 C)  98.1 F (36.7 C)  TempSrc:  Oral  Oral  SpO2: 99% 95% 96% 95%  Weight:  Height:        Intake/Output Summary (Last 24 hours) at 12/24/2018 1651 Last data filed at 12/24/2018 1450 Gross per 24 hour  Intake 850 ml  Output 1252 ml  Net -402 ml   Filed Weights   12/22/18 1056 12/23/18 0800 12/24/18 0516  Weight: 55.6 kg 55.1 kg 53.3 kg    Examination:  General exam: Appears calm and comfortable  Respiratory system: Clear to auscultation. Respiratory effort normal. Cardiovascular system: irregularly irregular, tachy  Gastrointestinal system: Abdomen is nondistended, soft and nontender. Central nervous system: Alert and oriented. No focal neurological deficits. Extremities: no LEE  Skin: No rashes, lesions or ulcers Psychiatry: Judgement and insight appear normal. Mood & affect appropriate.     Data Reviewed: I have personally reviewed following labs and imaging studies  CBC: Recent Labs  Lab 12/22/18 1143 12/23/18 0633 12/24/18 0454  WBC 18.8* 23.6* 19.5*  NEUTROABS  --  18.0* 14.5*  HGB 13.3 12.9 13.8  HCT 40.0 38.2 40.9  MCV 97.8 95.7 95.3  PLT 381 420* 396   Basic Metabolic Panel: Recent Labs  Lab 12/22/18 1143 12/23/18 0633 12/24/18 0454  NA 141 138 140  K 4.0 3.4* 3.8  CL 105 96* 100  CO2 27 32 29  GLUCOSE 109* 115* 101*  BUN 21 7* 9  CREATININE 0.62 0.56 0.61  CALCIUM 9.7 9.9 10.1  MG  --   --  1.7   GFR: Estimated Creatinine Clearance: 62.1 mL/min (by C-G formula based on SCr of 0.61 mg/dL). Liver Function Tests: Recent Labs  Lab 12/23/18 0633 12/24/18 0454  AST 27 34  ALT 36 32  ALKPHOS 123 117  BILITOT 0.4 0.6  PROT 6.5 6.5  ALBUMIN 2.5* 2.4*   No results for input(s): LIPASE, AMYLASE in the last 168 hours. No results for input(s): AMMONIA in the last 168 hours. Coagulation Profile: No results for input(s): INR, PROTIME in the last 168 hours. Cardiac Enzymes: Recent Labs  Lab 12/24/18 0211  TROPONINI <0.03   BNP (last 3 results) No results for input(s): PROBNP in the last 8760 hours. HbA1C: Recent Labs    12/22/18 1143  HGBA1C 5.6   CBG: Recent Labs  Lab 12/23/18 2121 12/24/18 0749  GLUCAP 106* 124*   Lipid Profile: Recent Labs    12/23/18 0633  CHOL 125  HDL 26*  LDLCALC 61  TRIG 578*  CHOLHDL 4.8   Thyroid Function Tests: Recent Labs    12/22/18 1143  TSH 1.246   Anemia Panel: Recent Labs    12/22/18 1143  FERRITIN 224   Sepsis Labs: Recent Labs  Lab 12/22/18 1143 12/22/18 1445 12/23/18 0633  PROCALCITON 0.20  --   0.15  LATICACIDVEN 1.4 1.9  --     Recent Results (from the past 240 hour(s))  Culture, blood (Routine X 2) w Reflex to ID Panel     Status: None (Preliminary result)   Collection Time: 12/22/18 11:40 AM  Result Value Ref Range Status   Specimen Description BLOOD RIGHT ANTECUBITAL  Final   Special Requests   Final    BOTTLES DRAWN AEROBIC ONLY Blood Culture results may not be optimal due to an inadequate volume of blood received in culture bottles   Culture   Final    NO GROWTH 2 DAYS Performed at [facility], [address], [place], [place] [address]    Report Status PENDING  Incomplete  Culture, blood (Routine X 2) w Reflex to ID Panel  Status: None (Preliminary result)   Collection Time: 12/22/18 11:45 AM  Result Value Ref Range Status   Specimen Description BLOOD RIGHT ANTECUBITAL  Final   Special Requests   Final    BOTTLES DRAWN AEROBIC ONLY Blood Culture adequate volume   Culture   Final    NO GROWTH 2 DAYS Performed at [facility], [address] [place], [place] [address]    Report Status PENDING  Incomplete  SARS Coronavirus 2 [facility](Hospital order, Performed in [facility] hospital lab)     Status: None   Collection Time: 12/22/18  2:58 PM  Result Value Ref Range Status   SARS Coronavirus 2 NEGATIVE NEGATIVE Final    Comment: (NOTE) If result is NEGATIVE SARS-CoV-2 target nucleic acids are NOT DETECTED. The SARS-CoV-2 RNA is generally detectable in upper and lower  respiratory specimens during the acute phase of infection. The lowest  concentration of SARS-CoV-2 viral copies this assay can detect is 250  copies / mL. Samuella Rasool negative result does not preclude SARS-CoV-2 infection  and should not be used as the sole basis for treatment or other  patient management decisions.  Pria Klosinski negative result may occur with  improper specimen collection / handling, submission of specimen other  than nasopharyngeal swab, presence of viral mutation(s) within the  areas  targeted by this assay, and inadequate number of viral copies  (<250 copies / mL). Barlow Harrison negative result must be combined with clinical  observations, patient history, and epidemiological information. If result is POSITIVE SARS-CoV-2 target nucleic acids are DETECTED. The SARS-CoV-2 RNA is generally detectable in upper and lower  respiratory specimens dur ing the acute phase of infection.  Positive  results are indicative of active infection with SARS-CoV-2.  Clinical  correlation with patient history and other diagnostic information is  necessary to determine patient infection status.  Positive results do  not rule out bacterial infection or co-infection with other viruses. If result is PRESUMPTIVE POSTIVE SARS-CoV-2 nucleic acids MAY BE PRESENT.   Dayvin Aber presumptive positive result was obtained on the submitted specimen  and confirmed on repeat testing.  While 2019 novel coronavirus  (SARS-CoV-2) nucleic acids may be present in the submitted sample  additional confirmatory testing may be necessary for epidemiological  and / or clinical management purposes  to differentiate between  SARS-CoV-2 and other Sarbecovirus currently known to infect humans.  If clinically indicated additional testing with an alternate test  methodology [phone] is advised. The SARS-CoV-2 RNA is generally  detectable in upper and lower respiratory sp ecimens during the acute  phase of infection. The expected result is Negative. Fact Sheet for Patients:  BoilerBrush.com.cyhttps://www.fda.gov/media/136312/download Fact Sheet for Healthcare Providers: https://pope.com/https://www.fda.gov/media/136313/download This test is not yet approved or cleared by the [place] FDA and has been authorized for detection and/or diagnosis of SARS-CoV-2 by FDA under an Emergency Use Authorization (EUA).  This EUA will remain in effect (meaning this test can be used) for the duration of the COVID-19 declaration under Section 564(b)(1) of the Act, 21 U.S.C. section  360bbb-3(b)(1), unless the authorization is terminated or revoked sooner. Performed at [facility], [address] [place], [place] [address]   Culture, fungus without smear     Status: None (Preliminary result)   Collection Time: 12/23/18  6:22 PM  Result Value Ref Range Status   Specimen Description SPUTUM  Final   Special Requests   Final    NONE Performed at [facility], [address] [place],  [place] [address]    Culture PENDING  Incomplete   Report Status PENDING  Incomplete         Radiology Studies: Dg Chest Port 1 View  Result Date: 12/23/2018 CLINICAL DATA:  63 y/o  F; bronchi and tachypnea. EXAM: PORTABLE CHEST 1 VIEW COMPARISON:  07/05/2013 chest radiograph. FINDINGS: Stable cardiac silhouette within normal limits given projection technique. Aortic calcific atherosclerosis. Coarse interstitial opacities are present diffusely throughout the lungs. No consolidation, effusion, or pneumothorax. No acute osseous abnormality is evident. IMPRESSION: Diffuse coarse interstitial opacities, suspected background of interstitial lung disease. Acute atypical pneumonia or bronchitis may be present. No focal consolidation. Electronically Signed   By: Mitzi Hansen M.D.   On: 12/23/2018 01:02        Scheduled Meds: . aspirin EC  81 mg Oral Daily  . diltiazem  90 mg Oral Q6H  . docusate sodium  100 mg Oral BID  . levalbuterol  1.25 mg Nebulization TID  . nicotine  21 mg Transdermal Daily   Continuous Infusions:   LOS: 2 days    Time spent: over 30 min    Lacretia Nicks, MD [facility] Pager AMION  If 7PM-7AM, please contact night-coverage [email or web address] Password [facility] 12/24/2018, 4:51 PM

## 2018-12-24 NOTE — Progress Notes (Signed)
[facility]   Reason for visit: Follow up on pulmonary nodules  Interval History: no changes, no fever, no sob, no cough; no n/v/d.   WBC down to 19.5 Remains off of antibiotics  Physical Exam: Constitutional:  Vitals:   12/24/18 0745 12/24/18 0900  BP: (!) 132/96   Pulse: (!) 166   Resp: (!) 28   Temp: 97.6 F (36.4 C)   SpO2: 95% 99%   patient appears in NAD Eyes: anicteric HENT: no thrush Respiratory: normal respiratory effort; CTA B Cardiovascular: irr irr GI: soft, nt, nd  Review of Systems: Constitutional: negative for fevers, chills, sweats and anorexia Respiratory: negative for dyspnea on exertion Gastrointestinal: negative for nausea and diarrhea  Lab Results  Component Value Date   WBC 19.5 (H) 12/24/2018   HGB 13.8 12/24/2018   HCT 40.9 12/24/2018   MCV 95.3 12/24/2018   PLT 396 12/24/2018    Lab Results  Component Value Date   CREATININE 0.61 12/24/2018   BUN 9 12/24/2018   NA 140 12/24/2018   K 3.8 12/24/2018   CL 100 12/24/2018   CO2 29 12/24/2018    Lab Results  Component Value Date   ALT 32 12/24/2018   AST 34 12/24/2018   ALKPHOS 117 12/24/2018     Microbiology: Recent Results (from the past 240 hour(s))  Culture, blood (Routine X 2) w Reflex to ID Panel     Status: None (Preliminary result)   Collection Time: 12/22/18 11:40 AM  Result Value Ref Range Status   Specimen Description BLOOD RIGHT ANTECUBITAL  Final   Special Requests   Final    BOTTLES DRAWN AEROBIC ONLY Blood Culture results may not be optimal due to an inadequate volume of blood received in culture bottles   Culture   Final    NO GROWTH 2 DAYS Performed at [facility], [address], [place], [place] [address]    Report Status PENDING  Incomplete  Culture, blood (Routine X 2) w Reflex to ID Panel     Status: None (Preliminary result)   Collection Time: 12/22/18 11:45 AM  Result Value Ref Range Status   Specimen Description  BLOOD RIGHT ANTECUBITAL  Final   Special Requests   Final    BOTTLES DRAWN AEROBIC ONLY Blood Culture adequate volume   Culture   Final    NO GROWTH 2 DAYS Performed at [facility], [address], [place], [place] [address]    Report Status PENDING  Incomplete  SARS Coronavirus 2 [facility] order, Performed in [facility] hospital lab)     Status: None   Collection Time: 12/22/18  2:58 PM  Result Value Ref Range Status   SARS Coronavirus 2 NEGATIVE NEGATIVE Final    Comment: (NOTE) If result is NEGATIVE SARS-CoV-2 target nucleic acids are NOT DETECTED. The SARS-CoV-2 RNA is generally detectable in upper and lower  respiratory specimens during the acute phase of infection. The lowest  concentration of SARS-CoV-2 viral copies this assay can detect is 250  copies / mL. A negative result does not preclude SARS-CoV-2 infection  and should not be used as the sole basis for treatment or other  patient management decisions.  A negative result may occur with  improper specimen collection / handling, submission of specimen other  than nasopharyngeal swab, presence of viral mutation(s) within the  areas targeted by this assay, and inadequate number of viral copies  (<250 copies / mL). A negative  result must be combined with clinical  observations, patient history, and epidemiological information. If result is POSITIVE SARS-CoV-2 target nucleic acids are DETECTED. The SARS-CoV-2 RNA is generally detectable in upper and lower  respiratory specimens dur ing the acute phase of infection.  Positive  results are indicative of active infection with SARS-CoV-2.  Clinical  correlation with patient history and other diagnostic information is  necessary to determine patient infection status.  Positive results do  not rule out bacterial infection or co-infection with other viruses. If result is PRESUMPTIVE POSTIVE SARS-CoV-2 nucleic acids MAY BE PRESENT.   A presumptive positive result was  obtained on the submitted specimen  and confirmed on repeat testing.  While 2019 novel coronavirus  (SARS-CoV-2) nucleic acids may be present in the submitted sample  additional confirmatory testing may be necessary for epidemiological  and / or clinical management purposes  to differentiate between  SARS-CoV-2 and other Sarbecovirus currently known to infect humans.  If clinically indicated additional testing with an alternate test  methodology [phone]([id number]) is advised. The SARS-CoV-2 RNA is generally  detectable in upper and lower respiratory sp ecimens during the acute  phase of infection. The expected result is Negative. Fact Sheet for Patients:  BoilerBrush.com.cyhttps://www.fda.gov/media/136312/download Fact Sheet for Healthcare Providers: https://pope.com/https://www.fda.gov/media/136313/download This test is not yet approved or cleared by the [place] FDA and has been authorized for detection and/or diagnosis of SARS-CoV-2 by FDA under an Emergency Use Authorization (EUA).  This EUA will remain in effect (meaning this test can be used) for the duration of the COVID-19 declaration under Section 564(b)(1) of the Act, 21 U.S.C. section 360bbb-3(b)(1), unless the authorization is terminated or revoked sooner. Performed at [facility], [address] [place], [place] [address]   Culture, fungus without smear     Status: None (Preliminary result)   Collection Time: 12/23/18  6:22 PM  Result Value Ref Range Status   Specimen Description SPUTUM  Final   Special Requests   Final    NONE Performed at [facility], [address] [place], [place] [address]    Culture PENDING  Incomplete   Report Status PENDING  Incomplete    Impression/Plan:  1. SOB - patient denies any symptoms of sob or DOE.  HR improved and is not tachypneic now.  Improved with afib treatment  2. New afib - tachycardic, on cardizem.    3.  Leukocytosis - elevated but improved now.  No signs/symptoms of infection.  Continue to  observe off of antibiotics.   4.  Tree -in-bud opacities - possible chronic infection (fungal, AFB).  Sputum sent.  With emphysema, would benefit from outpatient pulmonology evaluation.    I will sign off, call with questions, thanks.

## 2018-12-25 LAB — CBC
HCT: 42.3 % (ref 36.0–46.0)
Hemoglobin: 14.4 g/dL (ref 12.0–15.0)
MCH: 32.4 pg (ref 26.0–34.0)
MCHC: 34 g/dL (ref 30.0–36.0)
MCV: 95.1 fL (ref 80.0–100.0)
Platelets: 406 10*3/uL — ABNORMAL HIGH (ref 150–400)
RBC: 4.45 MIL/uL (ref 3.87–5.11)
RDW: 13.5 % (ref 11.5–15.5)
WBC: 13.8 10*3/uL — ABNORMAL HIGH (ref 4.0–10.5)
nRBC: 0 % (ref 0.0–0.2)

## 2018-12-25 LAB — MAGNESIUM: Magnesium: 1.7 mg/dL (ref 1.7–2.4)

## 2018-12-25 MED ORDER — DILTIAZEM HCL ER COATED BEADS 360 MG PO CP24: 360.0000 mg | ORAL_CAPSULE | Freq: Every day | ORAL | 0 refills | Status: DC

## 2018-12-25 MED ORDER — METOPROLOL TARTRATE 25 MG PO TABS: 25.0000 mg | ORAL_TABLET | Freq: Two times a day (BID) | ORAL | 0 refills | Status: DC

## 2018-12-25 MED ORDER — DILTIAZEM HCL ER COATED BEADS 180 MG PO CP24
360.0000 mg | ORAL_CAPSULE | Freq: Every day | ORAL | Status: DC
  Administered 2018-12-25: 360 mg via ORAL
  Filled 2018-12-25: qty 2

## 2018-12-25 MED ORDER — METOPROLOL TARTRATE 25 MG PO TABS
25.0000 mg | ORAL_TABLET | Freq: Two times a day (BID) | ORAL | Status: DC
  Administered 2018-12-25: 25 mg via ORAL
  Filled 2018-12-25: qty 1

## 2018-12-25 MED ORDER — METOPROLOL TARTRATE 5 MG/5ML IV SOLN
5.0000 mg | INTRAVENOUS | Status: DC | PRN
  Administered 2018-12-25: 5 mg via INTRAVENOUS
  Filled 2018-12-25: qty 5

## 2018-12-25 MED FILL — METOPROLOL TARTRATE 25 MG T: 25 | 30 days supply | Qty: 60 | Fill #0

## 2018-12-25 MED FILL — DILTIAZEM 24HR ER 360 MG CA: 360 | 30 days supply | Qty: 30 | Fill #0

## 2018-12-25 NOTE — TOC Transition Note (Signed)
Transition of Care [facility]) - CM/SW Discharge Note   Patient Details  Name: Carrie Case MRN: [id number] Date of Birth: 10-Dec-1955  [facility] CM/SW Contact:  Carrie Haven, RN Phone Number: 12/25/2018, 2:29 PM   Clinical Narrative:    From home with relative, acute resp failure, covid negative, , she will need ast with medications, Match Letter done, NCM contacted TOC to fill meds prior to dc , patient has 20.00 that she can use to pay for meds.  She states her daughter will transport her home tomorrow,  Also will get apt with clinic for follow up.  She may need home oxygen, e mRN will recheck sats in thec morning, she also needs HHPT  For charity, Carrie Case is able to take for charity.  Will need HHPT orders also.  5/7 Carrie Cape RN, BSN - NCM received call from [place] with [facility] , stating [facility] declined and [facility] will be taking patient for charity for HHPT.  Patient needs follow up apt scheduled. Patient does not need home oxygen.   Per pt eval today  patient does not need HHPT, so [facility] will not be providing this for charity.     Final next level of care: Home/Self Care Barriers to Discharge: No Barriers Identified   Patient Goals and CMS Choice Patient states their goals for this hospitalization and ongoing recovery are:: to go home CMS Medicare.gov Compare Post Acute Care list provided to:: Patient Choice offered to / list presented to : Patient  Discharge Placement                       Discharge Plan and Services In-house Referral: Financial Counselor Discharge Planning Services: CM Consult, Medication Assistance, Indigent Health Clinic, Follow-up appt scheduled, MATCH Program Post Acute Care Choice: NA          DME Arranged: N/A DME Agency: NA       HH Arranged: NA HH Agency: NA        Social Determinants of Health (SDOH) Interventions     Readmission Risk Interventions Readmission Risk Prevention Plan 12/25/2018 12/24/2018  Post  Dischage Appt Complete -  Medication Screening Complete -  Transportation Screening - Complete  Some recent data might be hidden

## 2018-12-25 NOTE — Progress Notes (Signed)
Physical Therapy Treatment Patient Details Name: Carrie Case MRN: [id number] DOB: [birth year] Today's Date: 12/25/2018    History of Present Illness Pt is a 63 y/o female admitted secondary to SOB with new onset a-fib with RVR. Pt required Cardizem drip. COVID19 testing was negative. No pertinent PMH.    PT Comments    Pt making good progress with functional mobility and tolerated ambulating in hallway without an AD. Pt on RA throughout with SPO2 maintaining >93% throughout. However, of note pt's HR fluctuating between 97 bpm and as high as 165 bpm with activity. Pt was asymptomatic throughout. PT will continue to follow acutely to progress mobility as tolerated.     Follow Up Recommendations  No PT follow up;Supervision - Intermittent     Equipment Recommendations  None recommended by PT    Recommendations for Other Services       Precautions / Restrictions Precautions Precautions: Fall Precaution Comments: monitor HR and SPO2 Restrictions Weight Bearing Restrictions: No    Mobility  Bed Mobility Overal bed mobility: Modified Independent                Transfers Overall transfer level: Modified independent Equipment used: None                Ambulation/Gait Ambulation/Gait assistance: Min guard;Supervision Gait Distance (Feet): 200 Feet Assistive device: None Gait Pattern/deviations: Step-through pattern;Decreased stride length Gait velocity: WFL   General Gait Details: pt with no instability or LOB, no need for UE supports or an AD, no physical assistance required, min guard progressing to supervision   Stairs             Wheelchair Mobility    Modified Rankin (Stroke Patients Only)       Balance Overall balance assessment: Needs assistance Sitting-balance support: Feet supported Sitting balance-Leahy Scale: Good     Standing balance support: During functional activity;No upper extremity supported Standing balance-Leahy Scale:  Fair                              Cognition Arousal/Alertness: Awake/alert Behavior During Therapy: Impulsive Overall Cognitive Status: Impaired/Different from baseline Area of Impairment: Safety/judgement;Problem solving                         Safety/Judgement: Decreased awareness of safety;Decreased awareness of deficits   Problem Solving: Difficulty sequencing;Requires verbal cues;Requires tactile cues        Exercises      General Comments        Pertinent Vitals/Pain Pain Assessment: No/denies pain    Home Living                      Prior Function            PT Goals (current goals can now be found in the care plan section) Acute Rehab PT Goals PT Goal Formulation: With patient Time For Goal Achievement: 01/07/19 Potential to Achieve Goals: Good Progress towards PT goals: Progressing toward goals    Frequency    Min 3X/week      PT Plan Current plan remains appropriate    Co-evaluation              AM-PAC PT "6 Clicks" Mobility   Outcome Measure  Help needed turning from your back to your side while in a flat bed without using bedrails?: None Help needed moving from lying on your  back to sitting on the side of a flat bed without using bedrails?: None Help needed moving to and from a bed to a chair (including a wheelchair)?: None Help needed standing up from a chair using your arms (e.g., wheelchair or bedside chair)?: None Help needed to walk in hospital room?: A Little Help needed climbing 3-5 steps with a railing? : A Little 6 Click Score: 22    End of Session   Activity Tolerance: Patient tolerated treatment well Patient left: in bed;with call bell/phone within reach Nurse Communication: Mobility status PT Visit Diagnosis: Other abnormalities of gait and mobility (R26.89)     Time: [id number] PT Time Calculation (min) (ACUTE ONLY): 11 min  Charges:  $Gait Training: 8-22 mins                      Deborah ChalkJennifer Merwyn Hodapp, [place]PT, DPT  Acute Rehabilitation Services Pager [phone] Office [phone]     Alessandra BevelsJennifer M Anamari Galeas 12/25/2018, 11:28 AM

## 2018-12-25 NOTE — Progress Notes (Signed)
MD paged:Tele called and stated pt hr went into 160's*2. Also please call daughter Junious Dresser # left in team sticky.

## 2018-12-25 NOTE — Discharge Summary (Signed)
Physician Discharge Summary  Carrie Case OEH:[id number] DOB: [birth year] DOA: 12/22/2018  PCP: Patient, No Pcp Per  Admit date: 12/22/2018 Discharge date: 12/25/2018  Time spent: 40 minutes  Recommendations for Outpatient Follow-up:  1. Follow up outpatient CBC/CMP  2. Follow up fungal antibodies 3. Follow up fungal culture 4. Follow final cultures 5. Afib with RVR, improved with metoprolol and diltiazem.  Chadsvasc 1, no anticoagulation at this time.  Recommended to follow up with cardiology and PCP. 6. Follow up with pulmonology as outpatient 7. Follow up CXR outpatient 8. Follow volume status, got IV lasix x1 here   Discharge Diagnoses:  Principal Problem:   Acute respiratory failure with hypoxia (HCC) Active Problems:   Tobacco dependence   Atrial fibrillation with RVR [facility])   Discharge Condition: stable  Diet recommendation: heart healthy   Filed Weights   12/23/18 0800 12/24/18 0516 12/25/18 0500  Weight: 55.1 kg 53.3 kg 51.3 kg    History of present illness:  Year-old female without significant past medical history except for tobacco abuse presents [name] with shortness of breath,hypoxia and new onset atrial fibrillation.  She was admitted with CT findings concerning for atypical infection and new onset afib.  ID recommended fungal cx and pulm follow up.  No abx at this time.  Afib improved with metop and diltiazem.  Will need outpatient follow up, PCP.  Hospital Course:  1)Atypical Lung infection  abx d/c'd per ID, appreciate assistance.  Consider possible chronic infection.  Follow sputum.  Will need outpatient pulm follow up.  Fungal antibodies pending  Fungal sputum cx pending Blood cx NGTD COVID negative x 2 (see care everywhere for 2nd test)  2)New Onset AFib- Chadsvasc is 1, no anticoagulation at this point in time Afib with RVR today, improved after IV metop with addition of PO metop to diltiazem D dimer 2.87, per H&P pt had CTA that  showed no clot Echo here with normal EF, diastolic function unable to be evaluated due to a fib (see report)  Follow up with cardiology outpatient  3)Tobacco Abuse---patient smokes 1 to 2 packs/day ,smoking cessation advised, continue nicotine patch - encourage cessation  4)Acutehypoxic respiratory failure- improved.  Follow up outpatient  Procedures: IMPRESSIONS    1. The left ventricle has normal systolic function, with an ejection fraction of 60-65%. The cavity size was normal. There is mildly increased left ventricular wall thickness. Left ventricular diastolic function could not be evaluated secondary to  atrial fibrillation.  2. The right ventricle has normal systolc function. The cavity was normal. Right ventricular systolic pressure is mildly elevated with an estimated pressure of 34.6 mmHg.  3. Left atrial size was mildly dilated.  4. The mitral valve is grossly normal. There is mild mitral annular calcification present.  5. The tricuspid valve was grossly normal.  6. The aortic valve is tricuspid Mild thickening of the aortic valve. Aortic valve regurgitation is trivial by color flow Doppler. No stenosis of the aortic valve.  7. The inferior vena cava was dilated in size with >50% respiratory variability.  8. Normal LV systolic function; mild LVH; mild LAE; mild TR; mild pulmonary hypertension  Consultations:  ID  Discharge Exam: Vitals:   12/25/18 1113 12/25/18 1428  BP: 110/78   Pulse: 80   Resp: (!) 23   Temp: 97.9 F (36.6 C)   SpO2: 90% 94%   Ready to go home. Eager. Feels normal. Discussed with daughter  General: No acute distress. Cardiovascular: irregularly irregular Lungs: Clear  to auscultation bilaterally  Abdomen: Soft, nontender, nondistended  Neurological: Alert and oriented 3. Moves all extremities 4 . Cranial nerves II through XII grossly intact. Skin: Warm and dry. No rashes or lesions. Extremities: No clubbing or cyanosis. No edema.   Psychiatric: Mood and affect are normal. Insight and judgment are appropriate.  Discharge Instructions   Discharge Instructions    (HEART FAILURE PATIENTS) Call MD:  Anytime you have any of the following symptoms: 1) 3 pound weight gain in 24 hours or 5 pounds in 1 week 2) shortness of breath, with or without a dry hacking cough 3) swelling in the hands, feet or stomach 4) if you have to sleep on extra pillows at night in order to breathe.   Complete by:  As directed    Call MD for:  difficulty breathing, headache or visual disturbances   Complete by:  As directed    Call MD for:  extreme fatigue   Complete by:  As directed    Call MD for:  hives   Complete by:  As directed    Call MD for:  persistant dizziness or light-headedness   Complete by:  As directed    Call MD for:  persistant nausea and vomiting   Complete by:  As directed    Call MD for:  redness, tenderness, or signs of infection (pain, swelling, redness, odor or green/yellow discharge around incision site)   Complete by:  As directed    Call MD for:  severe uncontrolled pain   Complete by:  As directed    Call MD for:  temperature >100.4   Complete by:  As directed    Diet - low sodium heart healthy   Complete by:  As directed    Discharge instructions   Complete by:  As directed    You were seen for atrial fibrillation with fast heart rate.  A fib puts you at a higher risk for stroke, but based on your risk factors, you don't need to be started on a blood thinner at this point in time.  We've started you on diltiazem and metoprolol for heart rate control.  Please follow up with a PCP as an outpatient.  It's very important that you establish with a PCP as an outpatient to continue to follow and manage these new medical issues.  You should also see a cardiologist.  You have pending labs because of your abnormal chest imaging.  Please follow up with your new PCP to follow up on these pending labs and to follow up with  a lung doctor.   Return for new, recurrent, or worsening symptoms.  Please ask your PCP to request records from this hospitalization so they know what was done and what the next steps will be.   Increase activity slowly   Complete by:  As directed      Allergies as of 12/25/2018      Reactions   Ibuprofen Hives      Medication List    TAKE these medications   diltiazem 360 MG 24 hr capsule Commonly known as:  CARDIZEM CD Take 1 capsule (360 mg total) by mouth daily for 30 days. Start taking on:  Dec 26, 2018   metoprolol tartrate 25 MG tablet Commonly known as:  LOPRESSOR Take 1 tablet (25 mg total) by mouth 2 (two) times daily for 30 days.      Allergies  Allergen Reactions  . Ibuprofen Hives   Follow-up Information  [facility] Follow up on 01/05/2019.   Why:  Televist: office will call at 9:50am, with Cain Saupe, MD Contact information: [address] [place] [address] [phone]           The results of significant diagnostics from this hospitalization (including imaging, microbiology, ancillary and laboratory) are listed below for reference.    Significant Diagnostic Studies: Dg Chest Port 1 View  Result Date: 12/23/2018 CLINICAL DATA:  63 y/o  F; bronchi and tachypnea. EXAM: PORTABLE CHEST 1 VIEW COMPARISON:  07/05/2013 chest radiograph. FINDINGS: Stable cardiac silhouette within normal limits given projection technique. Aortic calcific atherosclerosis. Coarse interstitial opacities are present diffusely throughout the lungs. No consolidation, effusion, or pneumothorax. No acute osseous abnormality is evident. IMPRESSION: Diffuse coarse interstitial opacities, suspected background of interstitial lung disease. Acute atypical pneumonia or bronchitis may be present. No focal consolidation. Electronically Signed   By: Mitzi Hansen M.D.   On: 12/23/2018 01:02    Microbiology: Recent Results (from  the past 240 hour(s))  Culture, blood (Routine X 2) w Reflex to ID Panel     Status: None (Preliminary result)   Collection Time: 12/22/18 11:40 AM  Result Value Ref Range Status   Specimen Description BLOOD RIGHT ANTECUBITAL  Final   Special Requests   Final    BOTTLES DRAWN AEROBIC ONLY Blood Culture results may not be optimal due to an inadequate volume of blood received in culture bottles   Culture   Final    NO GROWTH 3 DAYS Performed at [facility], [address] [place], [place] [address]    Report Status PENDING  Incomplete  Culture, blood (Routine X 2) w Reflex to ID Panel     Status: None (Preliminary result)   Collection Time: 12/22/18 11:45 AM  Result Value Ref Range Status   Specimen Description BLOOD RIGHT ANTECUBITAL  Final   Special Requests   Final    BOTTLES DRAWN AEROBIC ONLY Blood Culture adequate volume   Culture   Final    NO GROWTH 3 DAYS Performed at [facility], [address] [place], [place] [address]    Report Status PENDING  Incomplete  SARS Coronavirus 2 [facility] order, Performed in [facility] hospital lab)     Status: None   Collection Time: 12/22/18  2:58 PM  Result Value Ref Range Status   SARS Coronavirus 2 NEGATIVE NEGATIVE Final    Comment: (NOTE) If result is NEGATIVE SARS-CoV-2 target nucleic acids are NOT DETECTED. The SARS-CoV-2 RNA is generally detectable in upper and lower  respiratory specimens during the acute phase of infection. The lowest  concentration of SARS-CoV-2 viral copies this assay can detect is 250  copies / mL. A negative result does not preclude SARS-CoV-2 infection  and should not be used as the sole basis for treatment or other  patient management decisions.  A negative result may occur with  improper specimen collection / handling, submission of specimen other  than nasopharyngeal swab, presence of viral mutation(s) within the  areas targeted by this assay, and inadequate number of viral copies   (<250 copies / mL). A negative result must be combined with clinical  observations, patient history, and epidemiological information. If result is POSITIVE SARS-CoV-2 target nucleic acids are DETECTED. The SARS-CoV-2 RNA is generally detectable in upper and lower  respiratory specimens dur ing the acute phase of infection.  Positive  results are indicative of active infection with SARS-CoV-2.  Clinical  correlation with patient history and other diagnostic information is  necessary to determine patient infection status.  Positive results do  not rule out bacterial infection or co-infection with other viruses. If result is PRESUMPTIVE POSTIVE SARS-CoV-2 nucleic acids MAY BE PRESENT.   A presumptive positive result was obtained on the submitted specimen  and confirmed on repeat testing.  While 2019 novel coronavirus  (SARS-CoV-2) nucleic acids may be present in the submitted sample  additional confirmatory testing may be necessary for epidemiological  and / or clinical management purposes  to differentiate between  SARS-CoV-2 and other Sarbecovirus currently known to infect humans.  If clinically indicated additional testing with an alternate test  methodology [phone]) is advised. The SARS-CoV-2 RNA is generally  detectable in upper and lower respiratory sp ecimens during the acute  phase of infection. The expected result is Negative. Fact Sheet for Patients:  BoilerBrush.com.cy Fact Sheet for Healthcare Providers: https://pope.com/ This test is not yet approved or cleared by the [place] FDA and has been authorized for detection and/or diagnosis of SARS-CoV-2 by FDA under an Emergency Use Authorization (EUA).  This EUA will remain in effect (meaning this test can be used) for the duration of the COVID-19 declaration under Section 564(b)(1) of the Act, 21 U.S.C. section 360bbb-3(b)(1), unless the authorization is terminated  or revoked sooner. Performed at [facility], [address] [place], [place] [address]   Culture, fungus without smear     Status: None (Preliminary result)   Collection Time: 12/23/18  6:22 PM  Result Value Ref Range Status   Specimen Description SPUTUM  Final   Special Requests   Final    NONE Performed at [facility], [address] [place], [place] [address]    Culture PENDING  Incomplete   Report Status PENDING  Incomplete     Labs: Basic Metabolic Panel: Recent Labs  Lab 12/22/18 1143 12/23/18 0633 12/24/18 0454 12/24/18 1916 12/25/18 0302  NA 141 138 140 139  --   K 4.0 3.4* 3.8 3.6  --   CL 105 96* 100 98  --   CO2 27 32 29 31  --   GLUCOSE 109* 115* 101* 116*  --   BUN 21 7* 9 10  --   CREATININE 0.62 0.56 0.61 0.63  --   CALCIUM 9.7 9.9 10.1 9.7  --   MG  --   --  1.7  --  1.7   Liver Function Tests: Recent Labs  Lab 12/23/18 0633 12/24/18 0454 12/24/18 1916  AST 27 34 39  ALT 36 32 37  ALKPHOS 123 117 108  BILITOT 0.4 0.6 0.3  PROT 6.5 6.5 6.6  ALBUMIN 2.5* 2.4* 2.5*   No results for input(s): LIPASE, AMYLASE in the last 168 hours. No results for input(s): AMMONIA in the last 168 hours. CBC: Recent Labs  Lab 12/22/18 1143 12/23/18 0633 12/24/18 0454 12/25/18 0302  WBC 18.8* 23.6* 19.5* 13.8*  NEUTROABS  --  18.0* 14.5*  --   HGB 13.3 12.9 13.8 14.4  HCT 40.0 38.2 40.9 42.3  MCV 97.8 95.7 95.3 95.1  PLT 381 420* 396 406*   Cardiac Enzymes: Recent Labs  Lab 12/24/18 0211  TROPONINI <0.03   BNP: BNP (last 3 results) Recent Labs    12/24/18 0852  BNP 347.0*    ProBNP (last 3 results) No results for input(s): PROBNP in the last 8760 hours.  CBG: Recent Labs  Lab 12/23/18 2121  12/24/18 0749  GLUCAP 106* 124*       Signed:  Lacretia Nicks MD.  [facility] 12/25/2018, 6:52 PM

## 2018-12-25 NOTE — Progress Notes (Signed)
SATURATION QUALIFICATIONS: (This note is used to comply with regulatory documentation for home oxygen)  Patient Saturations on Room Air at Rest = 96%  Patient Saturations on Room Air while Ambulating = 99%  Patient Saturations on -- Liters of oxygen while Ambulating = n/a  Please briefly explain why patient needs home oxygen: Pt did not desat while ambulating on RA.  Deborah Chalk, [place], DPT  Acute Rehabilitation Services Pager [phone] Office [phone]

## 2018-12-27 LAB — FUNGAL ANTIBODIES PANEL, ID-BLOOD
Aspergillus flavus: NEGATIVE
Aspergillus fumigatus, IgG: NEGATIVE
Aspergillus niger: NEGATIVE
Blastomyces Abs, Qn, DID: NEGATIVE
Histoplasma Ab, Immunodiffusion: NEGATIVE

## 2018-12-28 LAB — CULTURE, BLOOD (ROUTINE X 2)
Culture: NO GROWTH
Culture: NO GROWTH
Special Requests: ADEQUATE

## 2018-12-29 ENCOUNTER — Telehealth: Payer: Self-pay | Admitting: General Practice

## 2018-12-29 NOTE — Telephone Encounter (Signed)
Carrie Case daughter of pt called wanting to report tht when mother takes metoprolol tartrate It makes her sleepy wants to make sure its not something to worry about since pt falls asleep 15/48min after taking please follow up

## 2018-12-30 NOTE — Telephone Encounter (Signed)
Can you contact caller and see if there is a cardiologist that patient was to follow-up with s/p hospital discharge as her appointment here is not until the 18th. I reviewed her hospital records and she was placed on metoprolol and diltaizem for control of heart rate due to afib.

## 2019-01-01 NOTE — Telephone Encounter (Signed)
Please see if patient can be scheduled for a sooner, in office follow-up of her recent hospitalization.  I will also place a referral to cardiology as per hospital discharge notes, patient also needed to be seen by a cardiologist

## 2019-01-01 NOTE — Telephone Encounter (Signed)
Spoke with Tonie Griffith (patient daughter) and she stated patient do not have a cardiologist. Per pt she thought our office was the Cardiologist office. Per pt daughter, this was the only f/u appt number the hospital gived her mother to call. Per patient hospital discharge information, patient was suppose f/u with [facility] and no one.  Per pt her concern for patient BP medications because patient was sleeping a lot and patient told her she felt useless. Per pt daughter Junious Dresser, she she visited patient on Monday May 11th, patient seemed like herself. But she was just concerned as to why patient was sleeping so much and did not do this until she got on those BP meds. Per pt daughter Junious Dresser, if it's left up to patient, patient would tell you everything is okay.

## 2019-01-01 NOTE — Telephone Encounter (Signed)
Spoke with patient and informed her that provider wanted her be seen sooner but pt daughter will not be able to bring her until Monday. So Monday is the only day they can come.

## 2019-01-05 ENCOUNTER — Ambulatory Visit: Payer: Self-pay | Attending: Family Medicine | Admitting: Family Medicine

## 2019-01-05 ENCOUNTER — Other Ambulatory Visit: Payer: Self-pay

## 2019-01-05 ENCOUNTER — Encounter: Payer: Self-pay | Admitting: Family Medicine

## 2019-01-05 VITALS — BP 113/73 | HR 107 | Temp 98.1°F | Ht 66.0 in | Wt 124.8 lb

## 2019-01-05 DIAGNOSIS — B49 Unspecified mycosis: Secondary | ICD-10-CM

## 2019-01-05 DIAGNOSIS — J849 Interstitial pulmonary disease, unspecified: Secondary | ICD-10-CM

## 2019-01-05 DIAGNOSIS — Z09 Encounter for follow-up examination after completed treatment for conditions other than malignant neoplasm: Secondary | ICD-10-CM

## 2019-01-05 DIAGNOSIS — R609 Edema, unspecified: Secondary | ICD-10-CM

## 2019-01-05 DIAGNOSIS — R600 Localized edema: Secondary | ICD-10-CM

## 2019-01-05 DIAGNOSIS — I4891 Unspecified atrial fibrillation: Secondary | ICD-10-CM

## 2019-01-05 DIAGNOSIS — F17200 Nicotine dependence, unspecified, uncomplicated: Secondary | ICD-10-CM

## 2019-01-05 DIAGNOSIS — R0989 Other specified symptoms and signs involving the circulatory and respiratory systems: Secondary | ICD-10-CM

## 2019-01-05 MED ORDER — FUROSEMIDE 20 MG PO TABS: ORAL_TABLET | ORAL | 0 refills | Status: DC

## 2019-01-05 MED ORDER — POTASSIUM CHLORIDE ER 10 MEQ PO TBCR: EXTENDED_RELEASE_TABLET | ORAL | 0 refills | Status: DC

## 2019-01-05 MED ORDER — FLUCONAZOLE 100 MG PO TABS: 100.0000 mg | ORAL_TABLET | Freq: Every day | ORAL | 0 refills | Status: DC

## 2019-01-05 MED FILL — FLUCONAZOLE 100 MG TABLET: 100 | 7 days supply | Qty: 7 | Fill #0

## 2019-01-05 MED FILL — FUROSEMIDE 20 MG TABS: 20 | 90 days supply | Qty: 45 | Fill #0

## 2019-01-05 MED FILL — POTASSIUM CHLORIDE ER 10 ME: 10 | 90 days supply | Qty: 45 | Fill #0

## 2019-01-05 NOTE — Patient Instructions (Signed)
How the Heart Works The heart is a Programmer, systems. The heart's job is to pump blood through the entire body. This job is important because blood carries oxygen and nutrients from the foods you eat to all the cells of your body. Blood also carries waste products away from your cells. What does the heart look like?  The heart is made up of four chambers. The upper chambers are called the right atrium and left atrium, and the lower chambers are called the right ventricle and left ventricle. The heart has several valves that separate the upper and lower chambers from each other and that separate the lower chambers of the heart from pathways that lead away from the heart. The valves include:  The tricuspid valve. This valve separates the right atrium from the right ventricle.  The mitral valve. This valve separates the left atrium from the left ventricle.  The pulmonary valve. This valve separates the right ventricle from a pathway that leads to the lungs.  The aortic valve. This valve separates the left ventricle from a pathway that leads to the rest of the body. How does blood travel through the heart? Blood that has traveled through the body enters the heart at the right atrium. Then the blood travels in this sequence: 1. Blood is pumped from the right atrium into the right ventricle. 2. Blood is pumped out of the heart into the lungs, where it picks up oxygen. 3. Blood re-enters the heart at the left atrium. 4. Blood is pumped into the left ventricle. 5. Blood is pumped out of the heart to the rest of the body. How does the heart beat? The heart beats when all the chambers of the heart squeeze (contract). The process starts when blood collects in the upper chambers of the heart. Once the chambers are full, a group of cells called the sinoatrial node sends out an electrical signal that makes the upper chambers contract. When the chambers contract, they push the blood through the tricuspid  and mitral valves into the lower chambers of the heart. Once the lower chambers have filled with blood, an electrical signal causes these chambers to contract. This pushes blood through the pulmonary and aortic valves and out of the heart. How does activity affect the heart? The heart beats faster and works harder when you are active. It beats more slowly and works less hard when you are resting. Your brain sends signals to the heart to meet the needs of your body. This information is not intended to replace advice given to you by your health care provider. Make sure you discuss any questions you have with your health care provider. Document Released: 07/11/2004 Document Revised: 07/11/2016 Document Reviewed: 07/11/2016 Elsevier Interactive Patient Education  2019 [employer].

## 2019-01-05 NOTE — Progress Notes (Signed)
Per pt she was in the hospital for her heart rate (per patient she have anxiety) and HTN.   Feet swollen

## 2019-01-05 NOTE — Progress Notes (Signed)
New Patient Office Visit  Subjective:  Patient ID: Carrie Case, female    DOB: [birth year]  Age: 63 y.o. MRN: [id number]  CC:  Chief Complaint  Patient presents with  . Hospitalization Follow-up    HPI Carrie Case presents for follow-up of recent hospitalization for acute respiratory failure with hypoxia, new onset atrial fibrillation with rapid ventricular response and tobacco dependence. On review of chart notes she also had peripheral edema and elevated BNP and received IV lasix x 1 during her hospitalization.       Patient reports that she feels much better status post recent hospitalization.  She states that her shortness of breath has improved and she now has a minimal cough.  Cough is nonproductive.  She reports that she is not sure " what all they did to me during my hospitalization".  Patient states that she has smoked since the age of 61 and usually has smoked around 3 packs/day of cigarettes and she currently rolls her own cigarettes.  Since her hospitalization she has been able to cut down to about 1-1/2 to 2 packs of cigarettes daily.  She would like to quit smoking eventually but she is not sure that she can decrease much further currently.      She reports that she generally does not go to the doctor and likely has not had medical follow-up in about 20 years.  She does not know if she has any history of COPD or elevated cholesterol.  She has not been on medications until her recent hospitalization.  She reports longstanding issues with feeling anxious.  She was working but was recently laid off from her job due work slowdown caused by COVID-19.       Since her hospitalization she has noticed that she is having issues with swelling in her feet and this did not happen prior to her hospitalization.  She does not recall any past diagnosis of heart failure.  She does feel short of breath with activity.  She denies any recent fever or chills.  No headache or dizziness.  No  abdominal pain-no nausea or vomiting.  No chest pain or palpitations.  She reports no history of fast heart rate or abnormal heart rhythm prior to her hospitalization.        Past Medical History:  Diagnosis Date  . Tobacco dependence     Past Surgical History:  Procedure Laterality Date  . TUBAL LIGATION      Family History  Problem Relation Age of Onset  . Hypertension Mother   . Hyperlipidemia Mother   . Cancer Mother   . COPD Mother   . Anxiety disorder Mother     . Social History   Tobacco Use  . Smoking status: Current Every Day Smoker    Packs/day: 1.50    Years: 47.00    Pack years: 70.50    Types: Cigarettes  . Smokeless tobacco: Never Used  Substance Use Topics  . Alcohol use: No  . Drug use: No   ROS Review of Systems  Constitutional: Positive for fatigue. Negative for chills and fever.  HENT: Positive for congestion. Negative for postnasal drip, rhinorrhea, sore throat and trouble swallowing.   Eyes: Negative for photophobia and visual disturbance.  Respiratory: Positive for cough (improved, now mild and non-productive) and shortness of breath.   Cardiovascular: Positive for leg swelling. Negative for chest pain and palpitations.  Gastrointestinal: Negative for abdominal pain, constipation, diarrhea and nausea.  Endocrine: Negative for cold  intolerance, heat intolerance, polydipsia, polyphagia and polyuria.  Genitourinary: Negative for dysuria and frequency.  Musculoskeletal: Positive for arthralgias. Negative for joint swelling.  Neurological: Negative for dizziness and headaches.  Hematological: Negative for adenopathy. Does not bruise/bleed easily.    Objective:   Today's Vitals: BP 113/73 (BP Location: Right Arm, Patient Position: Sitting, Cuff Size: Normal)   Pulse (!) 107   Temp 98.1 F (36.7 C) (Oral)   Ht 5\' 6"  (1.676 m)   Wt 124 lb 12.8 oz (56.6 kg)   SpO2 98%   BMI 20.14 kg/m   Physical Exam Vitals signs and nursing note reviewed.   Constitutional:      General: She is not in acute distress.    Appearance: Normal appearance. She is not ill-appearing.     Comments: Thin, almost underweight appearing female who appears slightly older than stated age sitting on exam table in no acute distress  HENT:     Mouth/Throat:     Mouth: Mucous membranes are dry.     Pharynx: Posterior oropharyngeal erythema present.     Tonsils: No tonsillar exudate.     Comments: Patient with a slightly whitish to brown sticky coating on the tongue and oral mucosa appears slightly dry.  Patient with mild posterior pharynx and oral mucosa erythema Neck:     Musculoskeletal: Normal range of motion and neck supple. No neck rigidity or muscular tenderness.     Thyroid: No thyromegaly.     Vascular: Carotid bruit (Bilateral carotid bruit, right greater than left) present. No JVD.  Cardiovascular:     Rate and Rhythm: Normal rate and regular rhythm.  Pulmonary:     Effort: Pulmonary effort is normal.     Breath sounds: Rhonchi present.  Abdominal:     Palpations: Abdomen is soft.     Tenderness: There is no abdominal tenderness. There is no right CVA tenderness, left CVA tenderness, guarding or rebound.  Musculoskeletal:        General: No tenderness.     Right lower leg: Edema present.     Left lower leg: Edema present.     Comments: Bilateral, nonpitting pedal and ankle edema.  No erythema or cyanosis of the skin of the feet or ankles.  Poorly palpable dorsalis pedis and posterior tibial pulses which may partially have been due to presence of edema  Lymphadenopathy:     Cervical: No cervical adenopathy.  Skin:    General: Skin is warm and dry.  Neurological:     General: No focal deficit present.     Mental Status: She is alert and oriented to person, place, and time.     Assessment & Plan:   1. Atrial fibrillation with RVR (HCC) At today's visit, patient is in regular rhythm with occasional skipped/ectopic beats.  Currently on  metoprolol and diltiazem.  She was also asked to start a daily baby aspirin and new referral placed with cardiology.  Discussed increased risk of embolic stroke due to presence of atrial fibrillation or paroxysmal atrial fibrillation. - Ambulatory referral to Cardiology  2. Peripheral edema Patient with peripheral edema and on review of her hospital records, patient did have elevated BNP and received IV Lasix during hospitalization.  Patient will have BNP as well as BMP at today's visit in follow-up of peripheral edema but I suspect the patient may have some heart failure secondary to lung disease/pulmonary hypertension.  Cardiology referral placed appointment hopefully has already been made as cardiology referral was also done prior  to patient's appointment.  Will have patient take Lasix 20 mg every other day and potassium chloride every other day to help with her edema - Brain natriuretic peptide - potassium chloride (K-DUR) 10 MEQ tablet; One tablet every other day with use of fluid pill  Dispense: 45 tablet; Refill: 0 - furosemide (LASIX) 20 MG tablet; One pill by mouth every other day to reduce leg swelling  Dispense: 45 tablet; Refill: 0 - Ambulatory referral to Cardiology - Basic Metabolic Panel  3. Interstitial lung disease [facility]) Patient had atypical findings on her chest CT done at an outside facility, [facility] and this was mentioned in her hospital records however could not find the actual CT report or impression.  She did have chest x-ray with findings suggestive of interstitial lung disease.  Patient has a long history of tobacco use and is status post hospitalization for acute respiratory failure with hypoxia.  Referral placed to pulmonology for further evaluation and treatment. - Ambulatory referral to Pulmonology  4. Tobacco dependence Patient is aware of the need for complete smoking cessation and states that she has been able to  decrease down from 3 packs/day to 1-1/2 to 2 packs/day but is not sure that she can decrease further at this time due to anxiety.  Patient will see pulmonology soon in follow-up and will return here within the next 3 to 4 weeks and at that time she will also be referred to the clinical pharmacist for further help with smoking cessation.  5. Fungal infection of lung Patient's hospital records were reviewed and patient had pending test/cultures.  Patient did have positive growth of yeast on sputum cultures and patient has been placed on Diflucan 100 mg daily x7 days and will follow-up with pulmonology. - fluconazole (DIFLUCAN) 100 MG tablet; Take 1 tablet (100 mg total) by mouth daily. For fungal lung infection  Dispense: 7 tablet; Refill: 0 - Ambulatory referral to Pulmonology  6. Bilateral carotid bruits Patient with bilateral carotid bruits on today's exam and also appears to have some cholesterol type deposits in her upper eyelids.  She did have lipid panel during hospitalization with normal total cholesterol of 125 and LDL of 61 and elevated triglycerides at 190 with low good cholesterol of 26.  She is being scheduled for vascular ultrasound and will discuss therapy for dyslipidemia at her next visit.  Patient was somewhat in a hurry today because her daughter brought her to the visit and her daughter has to drive back to [facility] for work today. - VAS US CAROTID; Future  7.  Hospital discharge follow-up Patient's hospital records were reviewed as well as labs which were done during hospitalization and which are now pending.  BMP done in follow-up of hypokalemia during hospitalization.  Patient also had greatly elevated BNP as well as peripheral edema during hospitalization and has suspected CHF.  BNP repeated at today's visit and patient started on every other day Lasix and potassium.  Referrals made for cardiology and pulmonology.  Patient has also been asked to start a daily baby aspirin secondary to  recent atrial fibrillation diagnosis as well as bilateral carotid bruits on exam.  Vascular ultrasound ordered.  Patient placed on Diflucan due to positive fungal cultures from sputum.  Outpatient Encounter Medications as of 01/05/2019  Medication Sig  . diltiazem (CARDIZEM CD) 360 MG 24 hr capsule Take 1 capsule (360 mg total) by mouth daily for 30 days.  . metoprolol tartrate (LOPRESSOR) 25 MG tablet  Take 1 tablet (25 mg total) by mouth 2 (two) times daily for 30 days.  . fluconazole (DIFLUCAN) 100 MG tablet Take 1 tablet (100 mg total) by mouth daily. For fungal lung infection  . furosemide (LASIX) 20 MG tablet One pill by mouth every other day to reduce leg swelling  . potassium chloride (K-DUR) 10 MEQ tablet One tablet every other day with use of fluid pill   No facility-administered encounter medications on file as of 01/05/2019.     Follow-up: Return in about 3 weeks (around 01/26/2019) for lung problems- needs ASAP f/u with Dr. Delford Field here and  3-4 weeks with PCP.   Cain Saupe, MD

## 2019-01-06 ENCOUNTER — Telehealth: Payer: Self-pay | Admitting: *Deleted

## 2019-01-06 LAB — BASIC METABOLIC PANEL WITH GFR
BUN/Creatinine Ratio: 18 (ref 12–28)
BUN: 12 mg/dL (ref 8–27)
CO2: 24 mmol/L (ref 20–29)
Calcium: 9.3 mg/dL (ref 8.7–10.3)
Chloride: 100 mmol/L (ref 96–106)
Creatinine, Ser: 0.65 mg/dL (ref 0.57–1.00)
GFR calc Af Amer: 111 mL/min/1.73
GFR calc non Af Amer: 96 mL/min/1.73
Glucose: 85 mg/dL (ref 65–99)
Potassium: 4.6 mmol/L (ref 3.5–5.2)
Sodium: 140 mmol/L (ref 134–144)

## 2019-01-06 LAB — BRAIN NATRIURETIC PEPTIDE: BNP: 245.6 pg/mL — ABNORMAL HIGH (ref 0.0–100.0)

## 2019-01-06 NOTE — Telephone Encounter (Signed)
Staff called central scheduling to sch patient Carrie Case. Ethelene Browns informed me to call the Vascular department directly. Called [phone] and [facility] with patient MRN number number and office call back number.

## 2019-01-07 ENCOUNTER — Telehealth: Payer: Self-pay

## 2019-01-07 NOTE — Telephone Encounter (Signed)
Called pt to set up evisit. Left message asking pt to call the office.  

## 2019-01-07 NOTE — Telephone Encounter (Signed)
Virtual Visit Pre-Appointment Phone Call TELEPHONE CALL NOTE  Carrie Case has been deemed a candidate for a follow-up tele-health visit to limit community exposure during the Covid-19 pandemic. I spoke with the patient via phone to ensure availability of phone/video source, confirm preferred email & phone number, and discuss instructions and expectations.  I reminded Carrie Case to be prepared with any vital sign and/or heart rhythm information that could potentially be obtained via home monitoring, at the time of her visit. I reminded Carrie Case to expect a phone call prior to her visit.  Patient agrees to consent below.  [name], RN 01/07/2019 11:09 AM    FULL LENGTH CONSENT FOR TELE-HEALTH VISIT   I hereby voluntarily request, consent and authorize [facility] and its employed or contracted physicians, physician assistants, nurse practitioners or other licensed health care professionals (the Practitioner), to provide me with telemedicine health care services (the "Services") as deemed necessary by the treating Practitioner. I acknowledge and consent to receive the Services by the Practitioner via telemedicine. I understand that the telemedicine visit will involve communicating with the Practitioner through live audiovisual communication technology and the disclosure of certain medical information by electronic transmission. I acknowledge that I have been given the opportunity to request an in-person assessment or other available alternative prior to the telemedicine visit and am voluntarily participating in the telemedicine visit.  I understand that I have the right to withhold or withdraw my consent to the use of telemedicine in the course of my care at any time, without affecting my right to future care or treatment, and that the Practitioner or I may terminate the telemedicine visit at any time. I understand that I have the right to inspect all information  obtained and/or recorded in the course of the telemedicine visit and may receive copies of available information for a reasonable fee.  I understand that some of the potential risks of receiving the Services via telemedicine include:  Marland Kitchen Delay or interruption in medical evaluation due to technological equipment failure or disruption; . Information transmitted may not be sufficient (e.g. poor resolution of images) to allow for appropriate medical decision making by the Practitioner; and/or  . In rare instances, security protocols could fail, causing a breach of personal health information.  Furthermore, I acknowledge that it is my responsibility to provide information about my medical history, conditions and care that is complete and accurate to the best of my ability. I acknowledge that Practitioner's advice, recommendations, and/or decision may be based on factors not within their control, such as incomplete or inaccurate data provided by me or distortions of diagnostic images or specimens that may result from electronic transmissions. I understand that the practice of medicine is not an exact science and that Practitioner makes no warranties or guarantees regarding treatment outcomes. I acknowledge that I will receive a copy of this consent concurrently upon execution via email to the email address I last provided but may also request a printed copy by calling the office of [facility].    I understand that my insurance will be billed for this visit.   I have read or had this consent read to me. . I understand the contents of this consent, which adequately explains the benefits and risks of the Services being provided via telemedicine.  . I have been provided ample opportunity to ask questions regarding this consent and the Services and have had my questions answered to my satisfaction. Marland Kitchen I  give my informed consent for the services to be provided through the use of telemedicine in my medical care   By participating in this telemedicine visit I agree to the above.

## 2019-01-08 ENCOUNTER — Telehealth (INDEPENDENT_AMBULATORY_CARE_PROVIDER_SITE_OTHER): Payer: Self-pay | Admitting: Interventional Cardiology

## 2019-01-08 ENCOUNTER — Encounter: Payer: Self-pay | Admitting: Interventional Cardiology

## 2019-01-08 ENCOUNTER — Other Ambulatory Visit: Payer: Self-pay

## 2019-01-08 DIAGNOSIS — R5383 Other fatigue: Secondary | ICD-10-CM

## 2019-01-08 DIAGNOSIS — I4891 Unspecified atrial fibrillation: Secondary | ICD-10-CM

## 2019-01-08 DIAGNOSIS — F17200 Nicotine dependence, unspecified, uncomplicated: Secondary | ICD-10-CM

## 2019-01-08 MED ORDER — DILTIAZEM HCL ER COATED BEADS 360 MG PO CP24: 360.0000 mg | ORAL_CAPSULE | Freq: Every day | ORAL | 3 refills | Status: DC

## 2019-01-08 MED ORDER — ASPIRIN EC 81 MG PO TBEC: 81.0000 mg | DELAYED_RELEASE_TABLET | Freq: Every day | ORAL | 3 refills | Status: DC

## 2019-01-08 NOTE — Patient Instructions (Addendum)
Medication Instructions:  Your physician has recommended you make the following change in your medication:   1. STOP: metoprolol  2. START: aspirin 81 mg once day  Lab work: None Ordered  If you have labs (blood work) drawn today and your tests are completely normal, you will receive your results only by: Marland Kitchen MyChart Message (if you have MyChart) OR . A paper copy in the mail If you have any lab test that is abnormal or we need to change your treatment, we will call you to review the results.  Testing/Procedures: None ordered  Follow-Up: . Follow up with Dr. Eldridge Dace in August with an EKG  Any Other Special Instructions Will Be Listed Below (If Applicable).

## 2019-01-08 NOTE — Progress Notes (Signed)
Virtual Visit via Video Note   This visit type was conducted due to national recommendations for restrictions regarding the COVID-19 Pandemic (e.g. social distancing) in an effort to limit this patient's exposure and mitigate transmission in our community.  Due to her co-morbid illnesses, this patient is at least at moderate risk for complications without adequate follow up.  This format is felt to be most appropriate for this patient at this time.  All issues noted in this document were discussed and addressed.  A limited physical exam was performed with this format.  Please refer to the patient's chart for her consent to telehealth for [facility].   Date:  01/08/2019   ID:  Carrie Case, DOB Jun 25, 1956, MRN [id number]  Patient Location: Home Provider Location: Home  PCP:  Cain Saupe, MD  Cardiologist:  No primary care provider on file. Dr. Jillyn Hidden Electrophysiologist:  None   Evaluation Performed:  Consultation - Carrie Case was referred by Dr. Jillyn Hidden for the evaluation of atrial fibrillation.  Chief Complaint:  AFib  History of Present Illness:    Carrie Case is a 63 y.o. female with recently diagnosed AFib.   The patient does not have symptoms concerning for COVID-19 infection (fever, chills, cough, or new shortness of breath).   She had a lung infection and was found to have AFib.  She feels tired and irritable.    Echo in 12/2018: The left ventricle has normal systolic function, with an ejection fraction of 60-65%. The cavity size was normal. There is mildly increased left ventricular wall thickness. Left ventricular diastolic function could not be evaluated secondary to  atrial fibrillation.  2. The right ventricle has normal systolc function. The cavity was normal. Right ventricular systolic pressure is mildly elevated with an estimated pressure of 34.6 mmHg.  3. Left atrial size was mildly dilated.  4. The mitral valve is grossly normal. There is mild mitral  annular calcification present.  5. The tricuspid valve was grossly normal.  6. The aortic valve is tricuspid Mild thickening of the aortic valve. Aortic valve regurgitation is trivial by color flow Doppler. No stenosis of the aortic valve.  7. The inferior vena cava was dilated in size with >50% respiratory variability.  8. Normal LV systolic function; mild LVH; mild LAE; mild TR; mild pulmonary hypertension.  Denies : Chest pain. Dizziness. Leg edema. Nitroglycerin use. Orthopnea. Palpitations. Paroxysmal nocturnal dyspnea. Syncope.   She has gone back to smoking, but has cut back to < 1 ppd.   Past Medical History:  Diagnosis Date  . Tobacco dependence    Past Surgical History:  Procedure Laterality Date  . TUBAL LIGATION       Current Meds  Medication Sig  . diltiazem (CARDIZEM CD) 360 MG 24 hr capsule Take 1 capsule (360 mg total) by mouth daily for 30 days.  . fluconazole (DIFLUCAN) 100 MG tablet Take 1 tablet (100 mg total) by mouth daily. For fungal lung infection  . furosemide (LASIX) 20 MG tablet One pill by mouth every other day to reduce leg swelling  . metoprolol tartrate (LOPRESSOR) 25 MG tablet Take 1 tablet (25 mg total) by mouth 2 (two) times daily for 30 days.  . potassium chloride (K-DUR) 10 MEQ tablet One tablet every other day with use of fluid pill     Allergies:   Ibuprofen   Social History   Tobacco Use  . Smoking status: Current Every Day Smoker    Packs/day: 1.50  Years: 47.00    Pack years: 70.50    Types: Cigarettes  . Smokeless tobacco: Never Used  Substance Use Topics  . Alcohol use: No  . Drug use: No     Family Hx: The patient's family history includes Anxiety disorder in her mother; COPD in her mother; Cancer in her mother; Hyperlipidemia in her mother; Hypertension in her mother.  ROS:   Please see the history of present illness.     All other systems reviewed and are negative.   Prior CV studies:   The following studies were  reviewed today:    Labs/Other Tests and Data Reviewed:    EKG:  An ECG dated 12/2018 was personally reviewed today and demonstrated:  AFib, controlled rate  Recent Labs: 12/22/2018: TSH 1.246 12/24/2018: ALT 37 12/25/2018: Hemoglobin 14.4; Magnesium 1.7; Platelets 406 01/05/2019: BNP 245.6; BUN 12; Creatinine, Ser 0.65; Potassium 4.6; Sodium 140   Recent Lipid Panel Lab Results  Component Value Date/Time   CHOL 125 12/23/2018 06:33 AM   TRIG 190 (H) 12/23/2018 06:33 AM   HDL 26 (L) 12/23/2018 06:33 AM   CHOLHDL 4.8 12/23/2018 06:33 AM   LDLCALC 61 12/23/2018 06:33 AM    Wt Readings from Last 3 Encounters:  01/08/19 135 lb (61.2 kg)  01/05/19 124 lb 12.8 oz (56.6 kg)  12/25/18 113 lb 1.6 oz (51.3 kg)     Objective:    Vital Signs:  Ht 5\' 6"  (1.676 m)   Wt 135 lb (61.2 kg)   BMI 21.79 kg/m    VITAL SIGNS:  reviewed GEN:  no acute distress RESPIRATORY:  normal respiratory effort, symmetric expansion PSYCH:  normal affect smoking during the visit  ASSESSMENT & PLAN:    1. AFib: HR in the 60s.  THought to be normal rhythm per PMD.  She states her pulse is back to regular.  She should start an aspirin 81 mg daily.  Refill Diltiazem.  2. Tobacco abuse: She needs to stop smoking.  She is planning on having PFTs in the near future. 3. Fatigue: May be multifactorial.  Will stop metoprolol.   This patients CHA2DS2-VASc Score and unadjusted Ischemic Stroke Rate (% per year) is equal to 0.6 % stroke rate/year from a score of 1  Above score calculated as 1 point each if present [CHF, HTN, DM, Vascular=MI/PAD/Aortic Plaque, Age if 65-74, or Female] Above score calculated as 2 points each if present [Age > 75, or Stroke/TIA/TE]    COVID-19 Education: The signs and symptoms of COVID-19 were discussed with the patient and how to seek care for testing (follow up with PCP or arrange E-visit).  The importance of social distancing was discussed today.  Time:   Today, I have spent 25  minutes with the patient with telehealth technology discussing the above problems.     Medication Adjustments/Labs and Tests Ordered: Current medicines are reviewed at length with the patient today.  Concerns regarding medicines are outlined above.   Tests Ordered: No orders of the defined types were placed in this encounter.   Medication Changes: No orders of the defined types were placed in this encounter.   Disposition:  Follow up in 3 month(s)  Signed, Lance MussJayadeep Liliana Dang, MD  01/08/2019 4:14 PM    [facility]

## 2019-01-09 ENCOUNTER — Ambulatory Visit (HOSPITAL_COMMUNITY): Payer: Self-pay

## 2019-01-11 NOTE — Progress Notes (Addendum)
Subjective:    Patient ID: Carrie Case, female    DOB: 11-30-1955, 63 y.o.   MRN: [id number]  This is a 63 year old female who recently developed increasing shortness of breath wheezing and cough and then was admitted in May after transfer from a local hospital for acute respiratory failure hypoxemia and interstitial infiltrates.  The patient denies any previous known history of COPD although she continues to smoke 2 packs a day of cigarettes.  The patient typically is not seen by physicians on a regular basis.  When she was seen to establish here in the clinic in May her new primary care doctor referred her to me for further pulmonary evaluation.  During the most recent hospitalization she had new onset atrial fibrillation with rapid ventricular response.  She has been back to see cardiology through a telemetry visit and they stopped her metoprolol but did ask her to continue diltiazem 360 mg daily.  She still has great amount of weakness and fatigue and feels she is not much improved in that respect.  She denies any palpitations and her heart.  She denies any chest pain at this time.  Note her brain natruretic peptide has not been elevated and also note that she is not having peripheral edema and cardiology in fact asked her to reduce the dose of her potassium the Lasix till she was off this completely  Echocardiogram done in the hospital did not show reduction in ejection fraction or mitral or other valve disease      Apparently a CT scan done at the outside facility showed interstitial changes.  Chest x-ray done locally was not that impressive.    Sputum culture did grow out Candida tropicalis and glabrata but these may have been contaminants I am not altogether sure this has anything to do with her interstitial process   Nevertheless the patient was given a course of Diflucan which she has now completed and she states that did not make any difference to her cough  The patient does have  bilateral carotid bruits and she has an upcoming carotid ultrasound Note during the hospitalization she was tested twice for COVID and was negative  Below is excerpts from the discharge summary Dc summary: Admit date: 12/22/2018 Discharge date: 12/25/2018  Discharge Diagnoses:  Principal Problem:   Acute respiratory failure with hypoxia (HCC) Active Problems:   Tobacco dependence   Atrial fibrillation with RVR (HCC)  History of present illness:  Year-old female without significant past medical history except for tobacco abuse presents from [facility] with shortness of breath,hypoxia and new onset atrial fibrillation.  She was admitted with CT findings concerning for atypical infection and new onset afib.  ID recommended fungal cx and pulm follow up.  No abx at this time.  Afib improved with metop and diltiazem.  Will need outpatient follow up, PCP.  Hospital Course:  1)Atypical Lung infection abx d/c'd per ID, appreciate assistance. Consider possible chronic infection. Follow sputum. Will need outpatient pulm follow up.  Fungal antibodies pending  Fungal sputum cx pending Blood cx NGTD COVID negative x 2 (see care everywhere for 2nd test)  2)New Onset AFib- Chadsvasc is 1, no anticoagulation at this point in time Afib with RVR today, improved after IV metop with addition of PO metop to diltiazem D dimer 2.87, per H&P pt had CTA that showed no clot Echo here with normal EF, diastolic function unable to be evaluated due to a fib (see report) Follow up with cardiology outpatient  3)Tobacco Abuse---patient smokes 1 to 2 packs/day ,smoking cessation advised, continue nicotine patch- encourage cessation  4)Acutehypoxic respiratory failure-improved.  Follow up outpatient   Note today in the office the patient when ambulating does not fall below 93% on room air  EKG done today in the office does show atrial fibrillation though rate is controlled at 73      Review of Systems  Pos in BOLD Constitutional:   No  weight loss, night sweats,  Fevers, chills, fatigue, lassitude. HEENT:   No headaches,  Difficulty swallowing,  Tooth/dental problems,  Sore throat,                No sneezing, itching, ear ache, nasal congestion, post nasal drip,   CV:  No chest pain,  Orthopnea, PND, swelling in lower extremities, anasarca, dizziness, palpitations  GI  No heartburn, indigestion, abdominal pain, nausea, vomiting, diarrhea, change in bowel habits, loss of appetite  Resp:  shortness of breath with exertion or at rest.  No excess mucus,  productive cough,  No non-productive cough,  No coughing up of blood.  No change in color of mucus.  No wheezing.  No chest wall deformity  Skin: no rash or lesions.  GU: no dysuria, change in color of urine, no urgency or frequency.  No flank pain.  MS:  No joint pain or swelling.  No decreased range of motion.  No back pain.  Psych:  No change in mood or affect. No depression or anxiety.  No memory loss.     Objective:   Physical Exam Vitals:   01/13/19 1522  BP: 104/65  Pulse: 80  Temp: 98.4 F (36.9 C)  TempSrc: Oral  SpO2: 95%  Weight: 119 lb 9.6 oz (54.3 kg)  Height:  (1.676 m)    Gen: Pleasant, thin d, in no distress,  normal affect  ENT: No lesions,  mouth clear,  oropharynx clear, no postnasal drip  Neck: No JVD, no TMG, no carotid bruits  Lungs: No use of accessory muscles, no dullness to percussion, distant breath sounds few scattered wheezes  Cardiovascular: RRR, heart sounds normal, no murmur or gallops, no peripheral edema  Abdomen: soft and NT, no HSM,  BS normal  Musculoskeletal: No deformities, no cyanosis or clubbing  Neuro: alert, non focal  Skin: Warm, no lesions or rashes  No results found.  Echo   1. The left ventricle has normal systolic function, with an ejection fraction of 60-65%. The cavity size was normal. There is mildly increased left ventricular wall  thickness. Left ventricular diastolic function could not be evaluated secondary to  atrial fibrillation. 2. The right ventricle has normal systolc function. The cavity was normal. Right ventricular systolic pressure is mildly elevated with an estimated pressure of 34.6 mmHg. 3. Left atrial size was mildly dilated. 4. The mitral valve is grossly normal. There is mild mitral annular calcification present. 5. The tricuspid valve was grossly normal. 6. The aortic valve is tricuspid Mild thickening of the aortic valve. Aortic valve regurgitation is trivial by color flow Doppler. No stenosis of the aortic valve. 7. The inferior vena cava was dilated in size with >50% respiratory variability. 8. Normal LV systolic function; mild LVH; mild LAE; mild TR; mild pulmonary hypertension  01/13/2019 EKG shows atrial fibrillation rate of 73 no acute changes    Assessment & Plan:  I personally reviewed all images and lab data in the [facility] system as well as any outside material available during this office visit  and agree with the  radiology impressions.   Atrial fibrillation with RVR (HCC) New onset atrial fibrillation now with rate controlled however I am concerned that the high dose of diltiazem at 360 mg daily is resulting in adverse side effects  We will reduce diltiazem to 300 mg daily  The patient already is on a tapering dose of Lasix and potassium which I agree with  For now we will continue aspirin at 81 mg daily however we need to consider anticoagulation in this patient  I will confer with cardiology further  Acute respiratory failure with hypoxia [facility](HCC) The patient has evidence of interstitial disease and chronic obstructive lung disease  We will need to get pulmonary function studies at some point and for now obtain a CT scan of the chest for further investigation of interstitial process  We will begin Spiriva 1 capsule daily for now  The patient has nebulized albuterol which she  will use as needed  Interstitial lung disease (HCC) We will evaluate this further with pulmonary function studies and CT scanning of the chest  Tobacco dependence I spent a considerable amount of time during this visit recommending smoking cessation and giving cessation counseling  COPD with chronic bronchitis (HCC) Chronic obstructive lung disease likely playing a role here as well we will continue albuterol nebulizer and begin Spiriva   Carrie Case was seen today for shortness of breath.  Diagnoses and all orders for this visit:  Interstitial pulmonary disease (HCC) -     CT Chest High Resolution; Future -     Comprehensive metabolic panel -     CBC with Differential/Platelet; Future -     CBC with Differential/Platelet  Tobacco dependence  Atrial fibrillation with RVR (HCC) -     Comprehensive metabolic panel -     CBC with Differential/Platelet; Future -     CBC with Differential/Platelet  COPD with chronic bronchitis (HCC) -     Comprehensive metabolic panel -     CBC with Differential/Platelet; Future -     CBC with Differential/Platelet  Acute respiratory failure with hypoxia (HCC)  Interstitial lung disease (HCC)  Other orders -     Discontinue: diltiazem (CARDIZEM CD) 240 MG 24 hr capsule; Take 1 capsule (240 mg total) by mouth daily. -     tiotropium (SPIRIVA [facility]ER) 18 MCG inhalation capsule; Place 1 capsule (18 mcg total) into inhaler and inhale daily. -     diltiazem (CARDIZEM CD) 300 MG 24 hr capsule; Take 1 capsule (300 mg total) by mouth daily.

## 2019-01-13 ENCOUNTER — Ambulatory Visit: Payer: Self-pay | Attending: Critical Care Medicine | Admitting: Critical Care Medicine

## 2019-01-13 ENCOUNTER — Encounter: Payer: Self-pay | Admitting: Critical Care Medicine

## 2019-01-13 ENCOUNTER — Other Ambulatory Visit: Payer: Self-pay

## 2019-01-13 VITALS — BP 104/65 | HR 80 | Temp 98.4°F | Ht 66.0 in | Wt 119.6 lb

## 2019-01-13 DIAGNOSIS — F17200 Nicotine dependence, unspecified, uncomplicated: Secondary | ICD-10-CM

## 2019-01-13 DIAGNOSIS — J9601 Acute respiratory failure with hypoxia: Secondary | ICD-10-CM | POA: Insufficient documentation

## 2019-01-13 DIAGNOSIS — Z79899 Other long term (current) drug therapy: Secondary | ICD-10-CM | POA: Insufficient documentation

## 2019-01-13 DIAGNOSIS — J849 Interstitial pulmonary disease, unspecified: Secondary | ICD-10-CM | POA: Insufficient documentation

## 2019-01-13 DIAGNOSIS — Z7982 Long term (current) use of aspirin: Secondary | ICD-10-CM | POA: Insufficient documentation

## 2019-01-13 DIAGNOSIS — F17210 Nicotine dependence, cigarettes, uncomplicated: Secondary | ICD-10-CM | POA: Insufficient documentation

## 2019-01-13 DIAGNOSIS — J449 Chronic obstructive pulmonary disease, unspecified: Secondary | ICD-10-CM | POA: Insufficient documentation

## 2019-01-13 DIAGNOSIS — I4891 Unspecified atrial fibrillation: Secondary | ICD-10-CM | POA: Insufficient documentation

## 2019-01-13 LAB — CULTURE, FUNGUS WITHOUT SMEAR

## 2019-01-13 MED ORDER — DILTIAZEM HCL ER COATED BEADS 240 MG PO CP24: 240.0000 mg | ORAL_CAPSULE | Freq: Every day | ORAL | 3 refills | Status: DC

## 2019-01-13 MED ORDER — DILTIAZEM HCL ER COATED BEADS 300 MG PO CP24: 300.0000 mg | ORAL_CAPSULE | Freq: Every day | ORAL | 3 refills | Status: DC

## 2019-01-13 MED ORDER — TIOTROPIUM BROMIDE MONOHYDRATE 18 MCG IN CAPS: 18.0000 ug | ORAL_CAPSULE | Freq: Every day | RESPIRATORY_TRACT | 2 refills | Status: DC

## 2019-01-13 MED FILL — CARTIA XT 300 MG CAPSULE SA: 300 | 30 days supply | Qty: 30 | Fill #0

## 2019-01-13 MED FILL — SPIRIVA 18 MCG CP-HANDIHALE: 18 | 30 days supply | Qty: 30 | Fill #0

## 2019-01-13 NOTE — Assessment & Plan Note (Signed)
New onset atrial fibrillation now with rate controlled however I am concerned that the high dose of diltiazem at 360 mg daily is resulting in adverse side effects  We will reduce diltiazem to 300 mg daily  The patient already is on a tapering dose of Lasix and potassium which I agree with  For now we will continue aspirin at 81 mg daily however we need to consider anticoagulation in this patient  I will confer with cardiology further

## 2019-01-13 NOTE — Progress Notes (Signed)
Per pt when she moves around her breathing is a little off but her major problem right now is she's sleeping all the time, per pt she's having a major mood swing,   O2 in office keeps going up and down and the lowest resting is 93% while she is talking

## 2019-01-13 NOTE — Assessment & Plan Note (Signed)
The patient has evidence of interstitial disease and chronic obstructive lung disease  We will need to get pulmonary function studies at some point and for now obtain a CT scan of the chest for further investigation of interstitial process  We will begin Spiriva 1 capsule daily for now  The patient has nebulized albuterol which she will use as needed

## 2019-01-13 NOTE — Assessment & Plan Note (Signed)
We will evaluate this further with pulmonary function studies and CT scanning of the chest

## 2019-01-13 NOTE — Assessment & Plan Note (Signed)
I spent a considerable amount of time during this visit recommending smoking cessation and giving cessation counseling

## 2019-01-13 NOTE — Patient Instructions (Addendum)
Change Cardizem to 300 mg daily, a refill will be issued from our pharmacy  Begin Spiriva 1 capsule inhale daily through the [facility], a prescription will be given from our pharmacy  Continue to follow with a furosemide tapering dose as per cardiology  A CT scan of your chest will be obtained  Labs today include a complete metabolic panel and complete blood count  Focus on smoking cessation  Return to see Dr. Delford FieldWright in 3 weeks  Atrial Fibrillation  Atrial fibrillation is a type of heartbeat that is irregular or fast (rapid). If you have this condition, your heart beats without any order. This makes it hard for your heart to pump blood in a normal way. Having this condition gives you more risk for stroke, heart failure, and other heart problems. Atrial fibrillation may start all of a sudden and then stop on its own, or it may become a long-lasting problem. What are the causes? This condition may be caused by heart conditions, such as:  High blood pressure.  Heart failure.  Heart valve disease.  Heart surgery. Other causes include:  Pneumonia.  Obstructive sleep apnea.  Lung cancer.  Thyroid disease.  Drinking too much alcohol. Sometimes the cause is not known. What increases the risk? You are more likely to develop this condition if:  You smoke.  You are older.  You have diabetes.  You are overweight.  You have a family history of this condition.  You exercise often and hard. What are the signs or symptoms? Common symptoms of this condition include:  A feeling like your heart is beating very fast.  Chest pain.  Feeling short of breath.  Feeling light-headed or weak.  Getting tired easily. Follow these instructions at home: Medicines  Take over-the-counter and prescription medicines only as told by your doctor.  If your doctor gives you a blood-thinning medicine, take it exactly as told. Taking too much of it can cause bleeding. Taking too little  of it does not protect you against clots. Clots can cause a stroke. Lifestyle      Do not use any tobacco products. These include cigarettes, chewing tobacco, and e-cigarettes. If you need help quitting, ask your doctor.  Do not drink alcohol.  Do not drink beverages that have caffeine. These include coffee, soda, and tea.  Follow diet instructions as told by your doctor.  Exercise regularly as told by your doctor. General instructions  If you have a condition that causes breathing to stop for a short period of time (apnea), treat it as told by your doctor.  Keep a healthy weight. Do not use diet pills unless your doctor says they are safe for you. Diet pills may make heart problems worse.  Keep all follow-up visits as told by your doctor. This is important. Contact a doctor if:  You notice a change in the speed, rhythm, or strength of your heartbeat.  You are taking a blood-thinning medicine and you see more bruising.  You get tired more easily when you move or exercise.  You have a sudden change in weight. Get help right away if:   You have pain in your chest or your belly (abdomen).  You have trouble breathing.  You have blood in your vomit, poop, or pee (urine).  You have any signs of a stroke. "BE FAST" is an easy way to remember the main warning signs: ? B - Balance. Signs are dizziness, sudden trouble walking, or loss of balance. ? E - Eyes.  Signs are trouble seeing or a change in how you see. ? F - Face. Signs are sudden weakness or loss of feeling in the face, or the face or eyelid drooping on one side. ? A - Arms. Signs are weakness or loss of feeling in an arm. This happens suddenly and usually on one side of the body. ? S - Speech. Signs are sudden trouble speaking, slurred speech, or trouble understanding what people say. ? T - Time. Time to call emergency services. Write down what time symptoms started.  You have other signs of a stroke, such as: ? A  sudden, very bad headache with no known cause. ? Feeling sick to your stomach (nausea). ? Throwing up (vomiting). ? Jerky movements you cannot control (seizure). These symptoms may be an emergency. Do not wait to see if the symptoms will go away. Get medical help right away. Call your local emergency services (911 in the [place] Do not drive yourself to the hospital. Summary  Atrial fibrillation is a type of heartbeat that is irregular or fast (rapid).  You are at higher risk of this condition if you smoke, are older, have diabetes, or are overweight.  Follow your doctor's instructions about medicines, diet, exercise, and follow-up visits.  Get help right away if you think that you have signs of a stroke. This information is not intended to replace advice given to you by your health care provider. Make sure you discuss any questions you have with your health care provider. Document Released: 05/15/2008 Document Revised: 09/27/2017 Document Reviewed: 09/27/2017 Elsevier Interactive Patient Education  2019 [employer].

## 2019-01-13 NOTE — Assessment & Plan Note (Signed)
Chronic obstructive lung disease likely playing a role here as well we will continue albuterol nebulizer and begin Spiriva

## 2019-01-14 LAB — CBC WITH DIFFERENTIAL/PLATELET
Basophils Absolute: 0.1 10*3/uL (ref 0.0–0.2)
Basos: 1 %
EOS (ABSOLUTE): 0.4 10*3/uL (ref 0.0–0.4)
Eos: 4 %
Hematocrit: 44.7 % (ref 34.0–46.6)
Hemoglobin: 15.5 g/dL (ref 11.1–15.9)
Immature Grans (Abs): 0 10*3/uL (ref 0.0–0.1)
Immature Granulocytes: 0 %
Lymphocytes Absolute: 2.7 10*3/uL (ref 0.7–3.1)
Lymphs: 29 %
MCH: 32.2 pg (ref 26.6–33.0)
MCHC: 34.7 g/dL (ref 31.5–35.7)
MCV: 93 fL (ref 79–97)
Monocytes Absolute: 1.1 10*3/uL — ABNORMAL HIGH (ref 0.1–0.9)
Monocytes: 12 %
Neutrophils Absolute: 5.2 10*3/uL (ref 1.4–7.0)
Neutrophils: 54 %
Platelets: 423 10*3/uL (ref 150–450)
RBC: 4.81 x10E6/uL (ref 3.77–5.28)
RDW: 13.8 % (ref 11.7–15.4)
WBC: 9.5 10*3/uL (ref 3.4–10.8)

## 2019-01-14 LAB — COMPREHENSIVE METABOLIC PANEL
ALT: 11 IU/L (ref 0–32)
AST: 11 IU/L (ref 0–40)
Albumin/Globulin Ratio: 1.3 (ref 1.2–2.2)
Albumin: 4 g/dL (ref 3.8–4.8)
Alkaline Phosphatase: 110 IU/L (ref 39–117)
BUN/Creatinine Ratio: 6 — ABNORMAL LOW (ref 12–28)
BUN: 5 mg/dL — ABNORMAL LOW (ref 8–27)
Bilirubin Total: 0.3 mg/dL (ref 0.0–1.2)
CO2: 29 mmol/L (ref 20–29)
Calcium: 9.8 mg/dL (ref 8.7–10.3)
Chloride: 94 mmol/L — ABNORMAL LOW (ref 96–106)
Creatinine, Ser: 0.77 mg/dL (ref 0.57–1.00)
GFR calc Af Amer: 96 mL/min/{1.73_m2} (ref >59)
GFR calc non Af Amer: 84 mL/min/{1.73_m2} (ref >59)
Globulin, Total: 3 g/dL (ref 1.5–4.5)
Glucose: 90 mg/dL (ref 65–99)
Potassium: 4.8 mmol/L (ref 3.5–5.2)
Sodium: 135 mmol/L (ref 134–144)
Total Protein: 7 g/dL (ref 6.0–8.5)

## 2019-01-19 ENCOUNTER — Ambulatory Visit (HOSPITAL_COMMUNITY)
Admission: RE | Admit: 2019-01-19 | Discharge: 2019-01-19 | Disposition: A | Discharge status: Home / Self Care | Payer: Self-pay | Source: Ambulatory Visit | Attending: Critical Care Medicine | Admitting: Critical Care Medicine

## 2019-01-19 ENCOUNTER — Other Ambulatory Visit: Payer: Self-pay

## 2019-01-19 DIAGNOSIS — J849 Interstitial pulmonary disease, unspecified: Secondary | ICD-10-CM | POA: Insufficient documentation

## 2019-01-26 ENCOUNTER — Ambulatory Visit: Payer: Self-pay | Admitting: Family Medicine

## 2019-01-26 ENCOUNTER — Ambulatory Visit (HOSPITAL_COMMUNITY): Payer: Self-pay

## 2019-01-26 ENCOUNTER — Telehealth: Payer: Self-pay | Admitting: Family Medicine

## 2019-01-26 NOTE — Telephone Encounter (Signed)
Patient called back stating she received a call from [place] in regards to CT results. Please follow up.

## 2019-01-26 NOTE — Telephone Encounter (Signed)
I called the patient back already earlier this morning

## 2019-01-30 ENCOUNTER — Other Ambulatory Visit: Payer: Self-pay

## 2019-01-30 ENCOUNTER — Encounter: Payer: Self-pay | Admitting: Family Medicine

## 2019-01-30 ENCOUNTER — Ambulatory Visit: Payer: Self-pay | Attending: Family Medicine | Admitting: Family Medicine

## 2019-01-30 DIAGNOSIS — I2510 Atherosclerotic heart disease of native coronary artery without angina pectoris: Secondary | ICD-10-CM

## 2019-01-30 DIAGNOSIS — F17200 Nicotine dependence, unspecified, uncomplicated: Secondary | ICD-10-CM

## 2019-01-30 DIAGNOSIS — J449 Chronic obstructive pulmonary disease, unspecified: Secondary | ICD-10-CM

## 2019-01-30 DIAGNOSIS — R296 Repeated falls: Secondary | ICD-10-CM

## 2019-01-30 DIAGNOSIS — F17210 Nicotine dependence, cigarettes, uncomplicated: Secondary | ICD-10-CM

## 2019-01-30 DIAGNOSIS — I4891 Unspecified atrial fibrillation: Secondary | ICD-10-CM

## 2019-01-30 DIAGNOSIS — J4489 Other specified chronic obstructive pulmonary disease: Secondary | ICD-10-CM

## 2019-01-30 DIAGNOSIS — R911 Solitary pulmonary nodule: Secondary | ICD-10-CM

## 2019-01-30 NOTE — Progress Notes (Signed)
Virtual Visit via Telephone Note  I connected with Carrie CurtLinda Case on 01/30/19 at  3:30 PM EDT by telephone and verified that I am speaking with the correct person using two identifiers.   I discussed the limitations, risks, security and privacy concerns of performing an evaluation and management service by telephone and the availability of in person appointments. I also discussed with the patient that there may be a patient responsible charge related to this service. The patient expressed understanding and agreed to proceed.  Patient Location: Home Provider Location: Office Others participating in call: Guillermina Cityctavia Richard, RMA   History of Present Illness:      63 year old female with COPD/emphysema with chronic bronchitis, atrial fibrillation, tobacco dependence and recent CT scan which did not show interstitial lung disease but patient with scattered pulmonary nodules in the right lung largest being 4 mm in the right lower lobe unchanged since 12/22/2018 chest CT and patient with evidence of three-vessel coronary atherosclerosis.  She reports that overall she feels much better.  She reports no current issues with shortness of breath or cough, no chest tightness or wheezing.  She has restarted smoking and is now up to 1 pack/day of cigarettes and she states that she has been under a lot of stress and she has a roommate who will not move out.  Patient denies any sensation of chest pain and no sensation of palpitations related to her atrial fibrillation.  She did have a telephone visit with the cardiologist and she states that she was told that she did discontinue the use of Lasix and potassium and her metoprolol was also discontinued.  She states that she has had no further swelling in her lower legs since she has stopped the Lasix and potassium.  She has had no issues with muscle cramping.  She does have some mild fatigue at times.      She reports that her main issue is that her right leg will suddenly  give away and she has fallen 5-6 times over the past few weeks.  She states that she fell forward yesterday hitting her head on the counter.  She states that she does not have any head or neck pain related to her recent fall and no other injuries with her prior falls but she is not sure what is causing her to fall.  She does not feel that she is passing out but rather that her right leg just suddenly gives away causing her to fall.  She has obtained a form from her landlord so that she can move from her second floor apartment to an apartment on a lower level due to her fear of falling down the stairs when trying to go to her second floor apartment.  She is not aware of any prior diagnosis of degenerative disc disease or pinched nerves in her back and she has had no issues with pain or weakness in the right knee.   Past Medical History:  Diagnosis Date  . Tobacco dependence     Past Surgical History:  Procedure Laterality Date  . TUBAL LIGATION      Family History  Problem Relation Age of Onset  . Hypertension Mother   . Hyperlipidemia Mother   . Cancer Mother   . COPD Mother   . Anxiety disorder Mother     Social History   Tobacco Use  . Smoking status: Current Every Day Smoker    Packs/day: 1.50    Years: 47.00    Pack  years: 70.50    Types: Cigarettes  . Smokeless tobacco: Never Used  Substance Use Topics  . Alcohol use: No  . Drug use: No     Allergies  Allergen Reactions  . Ibuprofen Hives       Observations/Objective: No vital signs or physical exam conducted as visit was done via telephone  Assessment and Plan: 1. COPD with chronic bronchitis ([place]) Patient is status post recent CT scan and results were discussed with the patient.  Patient with evidence of centrilobular and paraseptal emphysema with diffuse bronchial wall thickening.  No evidence of interstitial lung disease.  Patient will continue medications for treatment of COPD with chronic bronchitis as  prescribed by pulmonologist, Dr. Joya Gaskins.  Complete smoking cessation discussed and encouraged.  2. Atrial fibrillation, unspecified type [facility]) Patient reports no current symptoms of palpitations/increased heart rate due to her atrial fibrillation.  She will continue the use of diltiazem along with daily aspirin and continue cardiology follow-up.  3. Pulmonary nodule On recent CT scan done 01/19/2019, patient with a few scattered small solid pulmonary nodules in the right lung, the largest being 4 mm but this is unchanged since 12/22/2018 chest CT.  Discussed with patient the need for a 70-month follow-up CT to make sure that pulmonary nodule is stable and has not increased in size.  4. Atherosclerosis of native coronary artery of native heart without angina pectoris Discussed with patient that evidence of aortic atherosclerosis as well as three-vessel coronary atherosclerosis were seen on CT scan.  Patient has upcoming visit with cardiologist and patient was encouraged to mention the CT findings to the cardiologist as well.  Continue the use of Crestor as patient did have elevated triglycerides on lipid panel 12/23/2018 though LDL was normal at 61.  Continue low-fat diet and complete smoking cessation encouraged.  5. Tobacco dependence Patient reports that she has now increased her tobacco use up to about 1 pack/day after getting down to less than 10 cigarettes daily after hospitalization.  Discussed the importance of complete smoking cessation especially in light of her emphysema, chronic bronchitis, coronary arthrosclerosis and lung nodule.  Patient was encouraged to call if she would like referral to the clinical pharmacist for further assistance with smoking cessation  6. Recurrent falls while walking Patient reports that she is having recurrent falls with walking and does not believe that she is having any syncopal episodes but rather feels as if she is falling after her leg suddenly gives away.   Patient will be referred to orthopedics for further evaluation and treatment.  Fall precautions also discussed. - AMB referral to orthopedics  Follow Up Instructions:Return in about 6 weeks (around 03/13/2019) for labs and follow-up of chronic issues.    I discussed the assessment and treatment plan with the patient. The patient was provided an opportunity to ask questions and all were answered. The patient agreed with the plan and demonstrated an understanding of the instructions.   The patient was advised to call back or seek an in-person evaluation if the symptoms worsen or if the condition fails to improve as anticipated.  I provided  12  minutes of non-face-to-face time during this encounter.   Antony Blackbird, MD

## 2019-01-30 NOTE — Progress Notes (Signed)
Per pt she keeps falling due to leg giving out. Per patient this is about 5 times a day and she lives upstairs and have to go down 16 steps. Per pt her last fall was yesterday. Per pt she hit the couch and hit her forehead. Per patient she do not know what is causing it.   Per pt she would like for for provider to complete a form from her landlord she she'll be able to switch her apartment to a downstairs apartment.

## 2019-02-02 ENCOUNTER — Encounter: Payer: Self-pay | Admitting: Family Medicine

## 2019-02-02 ENCOUNTER — Other Ambulatory Visit: Payer: Self-pay

## 2019-02-02 ENCOUNTER — Ambulatory Visit (HOSPITAL_COMMUNITY)
Admission: RE | Admit: 2019-02-02 | Discharge: 2019-02-02 | Disposition: A | Discharge status: Home / Self Care | Payer: Self-pay | Source: Ambulatory Visit | Attending: Family Medicine | Admitting: Family Medicine

## 2019-02-02 ENCOUNTER — Other Ambulatory Visit: Payer: Self-pay | Admitting: Family Medicine

## 2019-02-02 DIAGNOSIS — Z79899 Other long term (current) drug therapy: Secondary | ICD-10-CM

## 2019-02-02 DIAGNOSIS — I6523 Occlusion and stenosis of bilateral carotid arteries: Secondary | ICD-10-CM

## 2019-02-02 DIAGNOSIS — R0989 Other specified symptoms and signs involving the circulatory and respiratory systems: Secondary | ICD-10-CM | POA: Insufficient documentation

## 2019-02-02 MED ORDER — ROSUVASTATIN CALCIUM 10 MG PO TABS: 10.0000 mg | ORAL_TABLET | Freq: Every day | ORAL | 11 refills | Status: DC

## 2019-02-02 MED FILL — ?ROSUVASTATIN CALCIUM 10 MG: 10 | 30 days supply | Qty: 30 | Fill #0

## 2019-02-02 NOTE — Progress Notes (Signed)
Patient ID: Carrie Case, female   DOB: [birth year], 63 y.o.   MRN: [id number]   Patient with carotid ultrasound carotid stenosis bilaterally worse greater than right.  Prescription will be sent to this pharmacy for Crestor 10 mg daily.  She has had lipid panel on 12/23/2018 with triglycerides of 190, HDL of 26, total cholesterol of 125 and LDL of 61.  Most recent liver enzymes were normal.  She will be asked to have a lab visit in about 6 weeks after starting her statin medication to have repeat LFTs.

## 2019-02-02 NOTE — Progress Notes (Signed)
Carotid duplex       has been completed. Preliminary results can be found under CV proc through chart review. Camillo Quadros, BS, RDMS, RVT   

## 2019-02-03 ENCOUNTER — Encounter: Payer: Self-pay | Admitting: Critical Care Medicine

## 2019-02-03 ENCOUNTER — Ambulatory Visit: Payer: Self-pay | Attending: Critical Care Medicine | Admitting: Critical Care Medicine

## 2019-02-03 DIAGNOSIS — I6523 Occlusion and stenosis of bilateral carotid arteries: Secondary | ICD-10-CM | POA: Insufficient documentation

## 2019-02-03 DIAGNOSIS — F17200 Nicotine dependence, unspecified, uncomplicated: Secondary | ICD-10-CM

## 2019-02-03 DIAGNOSIS — R05 Cough: Secondary | ICD-10-CM

## 2019-02-03 DIAGNOSIS — I4891 Unspecified atrial fibrillation: Secondary | ICD-10-CM

## 2019-02-03 DIAGNOSIS — J449 Chronic obstructive pulmonary disease, unspecified: Secondary | ICD-10-CM

## 2019-02-03 DIAGNOSIS — F17210 Nicotine dependence, cigarettes, uncomplicated: Secondary | ICD-10-CM

## 2019-02-03 MED ORDER — BUPROPION HCL ER (SR) 150 MG PO TB12: 150.0000 mg | ORAL_TABLET | Freq: Every day | ORAL | 3 refills | Status: DC

## 2019-02-03 MED ORDER — PREDNISONE 10 MG PO TABS: ORAL_TABLET | ORAL | 0 refills | Status: DC

## 2019-02-03 MED FILL — ?PREDNISONE 10 MG TABLET: 10 | 5 days supply | Qty: 20 | Fill #0

## 2019-02-03 MED FILL — SPIRIVA 18 MCG CP-HANDIHALE: 18 | 30 days supply | Qty: 30 | Fill #1

## 2019-02-03 MED FILL — BUPROPION SR 150 MG TABLET: 150 | 30 days supply | Qty: 30 | Fill #0

## 2019-02-03 MED FILL — CARTIA XT 300 MG CAPSULE SA: 300 | 30 days supply | Qty: 30 | Fill #1

## 2019-02-03 NOTE — Progress Notes (Signed)
Patient verified DOB Patient has taken medication today. Patient complains of coughing beginning yesterday, Patient has checked her temperature and has no fever. Patient complains of SOB this morning. Denies N/V

## 2019-02-03 NOTE — Progress Notes (Signed)
Patient ID: Carrie Case, female   DOB: Nov 20, 1955, 63 y.o.   MRN: [id number] Virtual Visit via Telephone Note  I connected with Carrie Case on 02/03/19 at  1:30 PM EDT by telephone and verified that I am speaking with the correct person using two identifiers.   Consent:  I discussed the limitations, risks, security and privacy concerns of performing an evaluation and management service by telephone and the availability of in person appointments. I also discussed with the patient that there may be a patient responsible charge related to this service. The patient expressed understanding and agreed to proceed.  Location of patient: The patient was at home  Location of provider: I was in my office  Persons participating in the televisit with the patient.   No one else on the call    History of Present Illness: This is a 63 year old female we have seen previously in the office on May 26 for evaluation of chronic lung disease.  There was a question of interstitial lung disease but a follow-up CT scan of the chest show only a few pulmonary nodules which will require follow-up and centrilobular emphysema.  There was no evidence of interstitial lung disease.  The patient did have a previous tree-in-bud inflammatory process when she was in the hospital however this is now cleared out.  There was a question of a fungal infection at that time.  In the interim the patient's level of shortness of breath had improved until 2 days ago when she had increased cough that developed that was productive of thick white mucus.  There is some wheezing.  Increased chest discomfort as well.  She is still actively smoking half a pack a day of cigarettes.  She does have occasional palpitations and still remains fatigued and sleepy.  Blood pressures at home recently been in the 92/68  To 100/63 range and she maintains Cardizem at 300 mg daily.  The patient had a recent carotid duplex Doppler ultrasound showing bilateral  moderate stenosis of both the right and left carotid internal arteries.  She does not yet have referrals to vascular surgery.  A prescription for Crestor was sent in but she has not yet picked this up.  The patient does maintain the Spiriva daily and the patient has a home pulse oximeter running in the 92% range recently Pos in BOLD  Constitutional:   No  weight loss, night sweats,  Fevers, chills, fatigue, lassitude. HEENT:   No headaches,  Difficulty swallowing,  Tooth/dental problems,  Sore throat,                No sneezing, itching, ear ache, nasal congestion, post nasal drip,   CV:  No chest pain,  Orthopnea, PND, swelling in lower extremities, anasarca, dizziness, palpitations  GI  No heartburn, indigestion, abdominal pain, nausea, vomiting, diarrhea, change in bowel habits, loss of appetite  Resp:  shortness of breath with exertion or at rest.  No excess mucus,  productive cough,  No non-productive cough,  No coughing up of blood.  No change in color of mucus.   wheezing.  No chest wall deformity  Skin: no rash or lesions.  GU: no dysuria, change in color of urine, no urgency or frequency.  No flank pain.  MS:  No joint pain or swelling.  No decreased range of motion.  No back pain.  Psych:  No change in mood or affect. No depression or anxiety.  No memory loss.  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 and centrilobular emphysematous components with ongoing tobacco use  Plan here for the patient will be to begin Wellbutrin 150 mg daily for tobacco cessation and then as well the patient will continue Spiriva daily and begin pulsed dose of prednisone 40 mg daily for 5 days and then discontinue  I do not believe antibiotics are necessary at this time and oxygen is not necessary  #2 bilateral carotid artery stenosis  Will proceed with referral to vascular surgery for  further evaluation and have the Crestor mailed to the patient's home  Follow Up Instructions: An in office visit will be scheduled end of July 1 August for pulmonary follow-up   I discussed the assessment and treatment plan with the patient. The patient was provided an opportunity to ask questions and all were answered. The patient agreed with the plan and demonstrated an understanding of the instructions.   The patient was advised to call back or seek an in-person evaluation if the symptoms worsen or if the condition fails to improve as anticipated.  I provided 30 minutes of non-face-to-face time during this encounter  including  median intraservice time , review of notes, labs, imaging, medications  and explaining diagnosis and management to the patient .    Asencion Noble, MD

## 2019-02-10 ENCOUNTER — Ambulatory Visit: Payer: Self-pay | Admitting: Orthopaedic Surgery

## 2019-02-18 ENCOUNTER — Telehealth: Payer: Self-pay | Admitting: Family Medicine

## 2019-02-18 NOTE — Telephone Encounter (Signed)
Please ask if she wants to pick up the letter tomorrow or mail it to the property management as per address on letter

## 2019-02-18 NOTE — Telephone Encounter (Signed)
Patient called to check on her paperwork for her apartment complex to get a downstairs apartment to help avoid the stairs. Please follow up.

## 2019-02-19 ENCOUNTER — Telehealth: Payer: Self-pay | Admitting: Family Medicine

## 2019-02-19 NOTE — Telephone Encounter (Signed)
Letter was mailed to [place] and a copy is sent to scan center.

## 2019-02-19 NOTE — Telephone Encounter (Signed)
LMOM

## 2019-02-19 NOTE — Telephone Encounter (Signed)
Patient called and states she would like her letter mailed instead of picking it up. Please follow up

## 2019-02-26 MED FILL — ?ROSUVASTATIN CALCIUM 10 MG: 10 | 30 days supply | Qty: 30 | Fill #1

## 2019-02-27 MED FILL — SPIRIVA 18 MCG CP-HANDIHALE: 18 | 30 days supply | Qty: 30 | Fill #2

## 2019-02-27 MED FILL — CARTIA XT 300 MG CAPSULE SA: 300 | 30 days supply | Qty: 30 | Fill #2

## 2019-03-03 ENCOUNTER — Telehealth: Payer: Self-pay | Admitting: Licensed Clinical Social Worker

## 2019-03-03 NOTE — Telephone Encounter (Signed)
Call placed to patient to follow up on consult to address behavioral health and/or resource needs. A detailed message was left requesting a return call.

## 2019-03-06 NOTE — Telephone Encounter (Signed)
Carrie Case was called and informed that PCP did write the letter that was sent over to the leasing office.

## 2019-03-06 NOTE — Telephone Encounter (Signed)
[name] from [place] called wanting verification form Dr.Fulp.. [name] is only available Wednesday and fridays, please follow up

## 2019-03-20 ENCOUNTER — Ambulatory Visit: Payer: Self-pay | Admitting: Family Medicine

## 2019-03-30 ENCOUNTER — Other Ambulatory Visit: Payer: Self-pay | Admitting: Family Medicine

## 2019-03-30 MED ORDER — ALBUTEROL SULFATE (2.5 MG/3ML) 0.083% IN NEBU: 2.5000 mg | INHALATION_SOLUTION | Freq: Four times a day (QID) | RESPIRATORY_TRACT | 2 refills | Status: DC | PRN

## 2019-03-30 NOTE — Telephone Encounter (Signed)
1) Medication(s) Requested (by name): albuterol 2) Pharmacy of Choice: [facility] 3) Special Requests:   Approved medications will be sent to the pharmacy, we will reach out if there is an issue.  Requests made after 3pm may not be addressed until the following business day!  If a patient is unsure of the name of the medication(s) please note and ask patient to call back when they are able to provide all info, do not send to responsible party until all information is available!

## 2019-03-31 NOTE — Telephone Encounter (Signed)
Medication was sent to patient pharmacy 03-30-19 with refills.

## 2019-04-02 ENCOUNTER — Other Ambulatory Visit: Payer: Self-pay | Admitting: Critical Care Medicine

## 2019-04-02 MED FILL — ?ROSUVASTATIN CALCIUM 10 MG: 10 | 30 days supply | Qty: 30 | Fill #2

## 2019-04-02 MED FILL — CARTIA XT 300 MG CAPSULE SA: 300 | 30 days supply | Qty: 30 | Fill #3

## 2019-04-03 ENCOUNTER — Ambulatory Visit: Payer: Self-pay | Admitting: Family Medicine

## 2019-04-03 MED FILL — SPIRIVA 18 MCG CP-HANDIHALE: 18 | 30 days supply | Qty: 30 | Fill #0

## 2019-04-07 ENCOUNTER — Other Ambulatory Visit: Payer: Self-pay | Admitting: Pharmacist

## 2019-04-07 MED ORDER — ALBUTEROL SULFATE HFA 108 (90 BASE) MCG/ACT IN AERS: 1.0000 | INHALATION_SPRAY | Freq: Four times a day (QID) | RESPIRATORY_TRACT | 0 refills | Status: DC | PRN

## 2019-04-07 MED FILL — !VENTOLIN HFA INHALER: 108 (90 BAS | 25 days supply | Qty: 18 | Fill #0

## 2019-04-21 ENCOUNTER — Ambulatory Visit: Payer: Self-pay | Admitting: Interventional Cardiology

## 2019-04-22 ENCOUNTER — Encounter: Payer: Self-pay | Admitting: Vascular Surgery

## 2019-04-23 ENCOUNTER — Ambulatory Visit: Payer: Self-pay | Attending: Family Medicine | Admitting: Family Medicine

## 2019-04-23 ENCOUNTER — Encounter: Payer: Self-pay | Admitting: Family Medicine

## 2019-04-23 DIAGNOSIS — J4489 Other specified chronic obstructive pulmonary disease: Secondary | ICD-10-CM

## 2019-04-23 DIAGNOSIS — I4891 Unspecified atrial fibrillation: Secondary | ICD-10-CM

## 2019-04-23 DIAGNOSIS — R0789 Other chest pain: Secondary | ICD-10-CM

## 2019-04-23 DIAGNOSIS — J449 Chronic obstructive pulmonary disease, unspecified: Secondary | ICD-10-CM

## 2019-04-23 MED ORDER — DILTIAZEM HCL ER COATED BEADS 300 MG PO CP24: 300.0000 mg | ORAL_CAPSULE | Freq: Every day | ORAL | 5 refills | Status: DC

## 2019-04-23 MED ORDER — BENZONATATE 100 MG PO CAPS: 100.0000 mg | ORAL_CAPSULE | Freq: Three times a day (TID) | ORAL | 0 refills | Status: DC | PRN

## 2019-04-23 MED ORDER — GUAIFENESIN-CODEINE 100-10 MG/5ML PO SOLN: 5.0000 mL | Freq: Four times a day (QID) | ORAL | 1 refills | Status: AC | PRN

## 2019-04-23 MED ORDER — DOXYCYCLINE HYCLATE 100 MG PO TABS: 100.0000 mg | ORAL_TABLET | Freq: Two times a day (BID) | ORAL | 0 refills | Status: DC

## 2019-04-23 NOTE — Progress Notes (Signed)
Virtual Visit via Telephone Note  I connected with Carrie Case on 04/23/19 at  3:10 PM EDT by telephone and verified that I am speaking with the correct person using two identifiers.   I discussed the limitations, risks, security and privacy concerns of performing an evaluation and management service by telephone and the availability of in person appointments. I also discussed with the patient that there may be a patient responsible charge related to this service. The patient expressed understanding and agreed to proceed.  Patient Location: Home Provider Location: Home Office Others participating in call: Call initiated by [place], CMA who then transferred the call to me   History of Present Illness:       63 year old female with complaint of recurrent cough with sputum that is clear however patient states that she has coughed until she feels as if she may have broken a rib.  Patient states that she now has right-sided chest pain over her rib area when she has recurrent coughing.  She reports that she is supposed to attend a wedding tomorrow and her daughter is driving her.  Patient states that she does not want to interrupt the wedding but coughing.  Patient would like something to help with the cough as well as with chest wall pain.  She reports that she is allergic to ibuprofen and over-the-counter Tylenol is not really helping with the pain.  Pain can be anywhere from a 4 with light cough up to an 8 on a 0-to-10 scale with 10 being the worst possible pain, with recurrent coughing.  She denies any fever or chills.  No left-sided chest pain.  No sensation of palpitations.  She does need refill of diltiazem for blood pressure and heart rate control.  She denies headache or dizziness.  No abdominal pain-no nausea/vomiting/diarrhea or constipation.  No peripheral edema.  Patient has had fatigue as she has interruption of sleep secondary to a recurrent cough and chest wall pain with coughing.   She denies any sensation of wheezing or chest tightness.   Past Medical History:  Diagnosis Date  . Acute respiratory failure with hypoxia ([place]) 12/22/2018  . Tobacco dependence     Past Surgical History:  Procedure Laterality Date  . TUBAL LIGATION      Family History  Problem Relation Age of Onset  . Hypertension Mother   . Hyperlipidemia Mother   . Cancer Mother   . COPD Mother   . Anxiety disorder Mother     Social History   Tobacco Use  . Smoking status: Current Every Day Smoker    Packs/day: 0.50    Years: 47.00    Pack years: 23.50    Types: Cigarettes  . Smokeless tobacco: Never Used  Substance Use Topics  . Alcohol use: No  . Drug use: No     Allergies  Allergen Reactions  . Ibuprofen Hives       Observations/Objective: No vital signs or physical exam conducted as visit was done via telephone  Assessment and Plan: 1. COPD with chronic bronchitis ([place]) Patient has history of COPD with chronic bronchitis and prescription will be sent in for doxycycline for treatment of chronic bronchitis.  She denies any current wheezing or chest congestion therefore prednisone will not be prescribed at this time.  She is also being provided with prescription for Tessalon Perles to help with the cough as well as Robitussin-AC to help with cough and chest wall pain.  Patient should follow-up next week if  her symptoms have not improved but go to the emergency department if she has any acute worsening of shortness of breath, cough, chest wall pain or any concerns. - doxycycline (VIBRA-TABS) 100 MG tablet; Take 1 tablet (100 mg total) by mouth 2 (two) times daily.  Dispense: 20 tablet; Refill: 0 - guaiFENesin-codeine 100-10 MG/5ML syrup; Take 5 mLs by mouth every 6 (six) hours as needed for up to 10 days for cough.  Dispense: 120 mL; Refill: 1 - benzonatate (TESSALON) 100 MG capsule; Take 1 capsule (100 mg total) by mouth 3 (three) times daily as needed for cough.  Dispense: 30  capsule; Refill: 0  2. Right-sided chest wall pain Patient with complaint of right-sided chest wall pain which is worse with coughing.  Discussed with the patient that she could have sprained or fractured ribs with prior coughing.  Patient should call if she is not having any improvement in this pain and arrangements will be made for patient to have chest x-ray and rib films.  If she has any acute worsening of her chest wall pain she should go to the emergency department for further evaluation and treatment.  Patient is given prescription for Robitussin-AC as this should help with both cough as well as pain as patient states that she is allergic to ibuprofen and Tylenol was not helping with her current pain.  Prescription also provided for Tessalon Perles to help decrease cough which should also help with the chest wall pain. - guaiFENesin-codeine 100-10 MG/5ML syrup; Take 5 mLs by mouth every 6 (six) hours as needed for up to 10 days for cough.  Dispense: 120 mL; Refill: 1 - benzonatate (TESSALON) 100 MG capsule; Take 1 capsule (100 mg total) by mouth 3 (three) times daily as needed for cough.  Dispense: 30 capsule; Refill: 0  3. Atrial fibrillation with [facility](HCC) Patient with believes that her heart rate is currently controlled on her current medication.  Diltiazem refill provided. - diltiazem (CARDIZEM CD) 300 MG 24 hr capsule; Take 1 capsule (300 mg total) by mouth daily.  Dispense: 30 capsule; Refill: 5  Follow Up Instructions:Return in about 1 week (around 04/30/2019), or if symptoms worsen or fail to improve, for Return to clinic next week if no improvement in symptoms or any worsening of symptoms.    I discussed the assessment and treatment plan with the patient. The patient was provided an opportunity to ask questions and all were answered. The patient agreed with the plan and demonstrated an understanding of the instructions.   The patient was advised to call back or seek an in-person  evaluation if the symptoms worsen or if the condition fails to improve as anticipated.  I provided 9 minutes of non-face-to-face time during this encounter.   Cain Saupeammie Sakai Wolford, MD

## 2019-04-23 NOTE — Progress Notes (Signed)
Patient verified DOB Patient has eaten today. Patient has taken medication today. Patient complains of bilateral rib pain for the past week. Patient complains of pain when deep breathing and coughing with no relief from the extra strength tylenol

## 2019-05-01 ENCOUNTER — Other Ambulatory Visit: Payer: Self-pay | Admitting: Critical Care Medicine

## 2019-05-01 DIAGNOSIS — I4891 Unspecified atrial fibrillation: Secondary | ICD-10-CM

## 2019-05-01 MED FILL — CARTIA XT 300 MG CAPSULE SA: 300 | 30 days supply | Qty: 30 | Fill #0

## 2019-05-01 MED FILL — ROSUVASTATIN CALCIUM 10 MG: 10 | 30 days supply | Qty: 30 | Fill #3

## 2019-05-01 MED FILL — SPIRIVA 18 MCG CP-HANDIHALE: 18 | 30 days supply | Qty: 30 | Fill #1

## 2019-05-03 NOTE — Progress Notes (Signed)
Cardiology Office Note   Date:  05/04/2019   ID:  Carrie Case, DOB [birth year], MRN [id number]  PCP:  Cain SaupeFulp, Cammie, MD    No chief complaint on file.  AFib  Wt Readings from Last 3 Encounters:  05/04/19 125 lb 12.8 oz (57.1 kg)  01/13/19 119 lb 9.6 oz (54.3 kg)  01/08/19 135 lb (61.2 kg)       History of Present Illness: Carrie MorganLinda B Tiedt is a 63 y.o. female  with recently diagnosed AFib.   The patient does not have symptoms concerning for COVID-19 infection (fever, chills, cough, or new shortness of breath).   She had a lung infection and was found to have AFib.  She feels tired and irritable.    Echo in 12/2018: The left ventricle has normal systolic function, with an ejection fraction of 60-65%. The cavity size was normal. There is mildly increased left ventricular wall thickness. Left ventricular diastolic function could not be evaluated secondary to  atrial fibrillation. 2. The right ventricle has normal systolc function. The cavity was normal. Right ventricular systolic pressure is mildly elevated with an estimated pressure of 34.6 mmHg. 3. Left atrial size was mildly dilated. 4. The mitral valve is grossly normal. There is mild mitral annular calcification present. 5. The tricuspid valve was grossly normal. 6. The aortic valve is tricuspid Mild thickening of the aortic valve. Aortic valve regurgitation is trivial by color flow Doppler. No stenosis of the aortic valve. 7. The inferior vena cava was dilated in size with >50% respiratory variability. 8. Normal LV systolic function; mild LVH; mild LAE; mild TR; mild pulmonary hypertension.  She struggled with stopping smoking.   Has had fatigue: "May be multifactorial.  Will stop metoprolol. "- noted in 2019.  Since the last visit, she has not stopped smoking.  SInce stopping metoprolol, she has insomnia.   Has some fatigue with walking.  She does 30 minutes/ day.      Past Medical History:   Diagnosis Date  . Acute respiratory failure with hypoxia (HCC) 12/22/2018  . Tobacco dependence     Past Surgical History:  Procedure Laterality Date  . TUBAL LIGATION       Current Outpatient Medications  Medication Sig Dispense Refill  . albuterol (PROVENTIL) (2.5 MG/3ML) 0.083% nebulizer solution Take 3 mLs (2.5 mg total) by nebulization every 6 (six) hours as needed for wheezing or shortness of breath. 75 mL 2  . albuterol (VENTOLIN HFA) 108 (90 Base) MCG/ACT inhaler Inhale 1-2 puffs into the lungs every 6 (six) hours as needed for wheezing or shortness of breath. 18 g 0  . aspirin EC 81 MG tablet Take 1 tablet (81 mg total) by mouth daily. 90 tablet 3  . benzonatate (TESSALON) 100 MG capsule Take 1 capsule (100 mg total) by mouth 3 (three) times daily as needed for cough. 30 capsule 0  . buPROPion (WELLBUTRIN SR) 150 MG 12 hr tablet Take 1 tablet (150 mg total) by mouth daily. 30 tablet 3  . CARTIA XT 300 MG 24 hr capsule TAKE 1 CAPSULE (300 MG TOTAL) BY MOUTH DAILY. 30 capsule 2  . rosuvastatin (CRESTOR) 10 MG tablet Take 1 tablet (10 mg total) by mouth daily. To decrease lipids 30 tablet 11  . SPIRIVA HANDIHALER 18 MCG inhalation capsule PLACE 1 CAPSULE (18 MCG TOTAL) INTO INHALER AND INHALE DAILY. 30 capsule 2   No current facility-administered medications for this visit.     Allergies:   Ibuprofen  Social History:  The patient  reports that she has been smoking cigarettes. She has a 23.50 pack-year smoking history. She has never used smokeless tobacco. She reports that she does not drink alcohol or use drugs.   Family History:  The patient's family history includes Anxiety disorder in her mother; COPD in her mother; Cancer in her mother; Hyperlipidemia in her mother; Hypertension in her mother.    ROS:  Please see the history of present illness.   Otherwise, review of systems are positive for fatigue.   All other systems are reviewed and negative.    PHYSICAL EXAM: VS:   BP 100/60   Pulse 79   Ht 5\' 6"  (1.676 m)   Wt 125 lb 12.8 oz (57.1 kg)   SpO2 98%   BMI 20.30 kg/m  , BMI Body mass index is 20.3 kg/m. GEN: Well nourished, well developed, in no acute distress  HEENT: normal  Neck: no JVD, 3/6 right carotid bruit, no masses Cardiac: RRR; no murmurs, rubs, or gallops,no edema  Respiratory:  clear to auscultation bilaterally, normal work of breathing GI: soft, nontender, nondistended, + BS MS: no deformity or atrophy  Skin: warm and dry, no rash Neuro:  Strength and sensation are intact Psych: euthymic mood, full affect   EKG:   The ekg ordered 5/26 demonstrates NSR   Recent Labs: 12/22/2018: TSH 1.246 12/25/2018: Magnesium 1.7 01/05/2019: BNP 245.6 01/13/2019: ALT 11; BUN 5; Creatinine, Ser 0.77; Hemoglobin 15.5; Platelets 423; Potassium 4.8; Sodium 135   Lipid Panel    Component Value Date/Time   CHOL 125 12/23/2018 0633   TRIG 190 (H) 12/23/2018 0633   HDL 26 (L) 12/23/2018 0633   CHOLHDL 4.8 12/23/2018 0633   VLDL 38 12/23/2018 0633   LDLCALC 61 12/23/2018 4403     Other studies Reviewed: Additional studies/ records that were reviewed today with results demonstrating: 12/2018 ECG reviewed.   ASSESSMENT AND PLAN:  1. AFib: Was in AFib in 12/2018 but is back in NSR at this time.  Aspirin for now. 2. Tobacco abuse: Trying Welbutrin.  Being treated for COPD with inhalers. 3. FAtigue: We stopped metoprolol in the past. FAtigue is better and but now has some insomnia.  4. Carotid disease: Right sided bruit on exam very prominent.  5. Losing weight, appetite is intermittent.  SHe does eat a lot of processed foods.  WIll give heart healthy diet info.    This patients CHA2DS2-VASc Score and unadjusted Ischemic Stroke Rate (% per year) is equal to 0.6 % stroke rate/year from a score of 1  Above score calculated as 1 point each if present [CHF, HTN, DM, Vascular=MI/PAD/Aortic Plaque, Age if 65-74, or Female] Above score calculated as 2  points each if present [Age > 75, or Stroke/TIA/TE]     Current medicines are reviewed at length with the patient today.  The patient concerns regarding her medicines were addressed.  The following changes have been made:  No change  Labs/ tests ordered today include:  No orders of the defined types were placed in this encounter.   Recommend 150 minutes/week of aerobic exercise Low fat, low carb, high fiber diet recommended  Disposition:   FU in 1 year   Signed, Larae Grooms, MD  05/04/2019 11:14 AM    [facility] [address], [place], [place]  [address] Phone: [phone]; Fax: [phone]

## 2019-05-04 ENCOUNTER — Encounter: Payer: Self-pay | Admitting: Interventional Cardiology

## 2019-05-04 ENCOUNTER — Ambulatory Visit (INDEPENDENT_AMBULATORY_CARE_PROVIDER_SITE_OTHER): Payer: Self-pay | Admitting: Interventional Cardiology

## 2019-05-04 ENCOUNTER — Other Ambulatory Visit: Payer: Self-pay

## 2019-05-04 VITALS — BP 100/60 | HR 79 | Ht 66.0 in | Wt 125.8 lb

## 2019-05-04 DIAGNOSIS — F17200 Nicotine dependence, unspecified, uncomplicated: Secondary | ICD-10-CM

## 2019-05-04 DIAGNOSIS — I6523 Occlusion and stenosis of bilateral carotid arteries: Secondary | ICD-10-CM

## 2019-05-04 DIAGNOSIS — I4891 Unspecified atrial fibrillation: Secondary | ICD-10-CM

## 2019-05-04 MED ORDER — DILTIAZEM HCL ER COATED BEADS 300 MG PO CP24: ORAL_CAPSULE | ORAL | 2 refills | Status: DC

## 2019-05-04 MED ORDER — ROSUVASTATIN CALCIUM 10 MG PO TABS: 10.0000 mg | ORAL_TABLET | Freq: Every day | ORAL | 11 refills | Status: DC

## 2019-05-04 NOTE — Patient Instructions (Signed)
Medication Instructions:  Your physician recommends that you continue on your current medications as directed. Please refer to the Current Medication list given to you today.  If you need a refill on your cardiac medications before your next appointment, please call your pharmacy.   Lab work: None Ordered If you have labs (blood work) drawn today and your tests are completely normal, you will receive your results only by:  MyChart Message (if you have MyChart) OR  A paper copy in the mail If you have any lab test that is abnormal or we need to change your treatment, we will call you to review the results.  Testing/Procedures: None Ordered  Follow-Up: At [facility], you and your health needs are our priority.  As part of our continuing mission to provide you with exceptional heart care, we have created designated Provider Care Teams.  These Care Teams include your primary Cardiologist (physician) and Advanced Practice Providers (APPs -  Physician Assistants and Nurse Practitioners) who all work together to provide you with the care you need, when you need it. You will need a follow up appointment in 1 years.  Please call our office 2 months in advance to schedule this appointment.  You may see Dr. Eldridge Dace or one of the following Advanced Practice Providers on your designated Care Team:   [place], PA-C Dayna Dunn, PA-C  Jacolyn Reedy, PA-C  Any Other Special Instructions Will Be Listed Below (If Applicable).   Heart-Healthy Eating Plan Heart-healthy meal planning includes:  Eating less unhealthy fats.  Eating more healthy fats.  Making other changes in your diet. Talk with your doctor or a diet specialist (dietitian) to create an eating plan that is right for you. What is my plan? Your doctor may recommend an eating plan that includes:  Total fat: ______% or less of total calories a day.  Saturated fat: ______% or less of total calories a day.  Cholesterol:  less than _________mg a day. What are tips for following this plan? Cooking Avoid frying your food. Try to bake, boil, grill, or broil it instead. You can also reduce fat by:  Removing the skin from poultry.  Removing all visible fats from meats.  Steaming vegetables in water or broth. Meal planning   At meals, divide your plate into four equal parts: ? Fill one-half of your plate with vegetables and green salads. ? Fill one-fourth of your plate with whole grains. ? Fill one-fourth of your plate with lean protein foods.  Eat 4-5 servings of vegetables per day. A serving of vegetables is: ? 1 cup of raw or cooked vegetables. ? 2 cups of raw leafy greens.  Eat 4-5 servings of fruit per day. A serving of fruit is: ? 1 medium whole fruit. ?  cup of dried fruit. ?  cup of fresh, frozen, or canned fruit. ?  cup of 100% fruit juice.  Eat more foods that have soluble fiber. These are apples, broccoli, carrots, beans, peas, and barley. Try to get 20-30 g of fiber per day.  Eat 4-5 servings of nuts, legumes, and seeds per week: ? 1 serving of dried beans or legumes equals  cup after being cooked. ? 1 serving of nuts is  cup. ? 1 serving of seeds equals 1 tablespoon. General information  Eat more home-cooked food. Eat less restaurant, buffet, and fast food.  Limit or avoid alcohol.  Limit foods that are high in starch and sugar.  Avoid fried foods.  Lose weight if  you are overweight.  Keep track of how much salt (sodium) you eat. This is important if you have high blood pressure. Ask your doctor to tell you more about this.  Try to add vegetarian meals each week. Fats  Choose healthy fats. These include olive oil and canola oil, flaxseeds, walnuts, almonds, and seeds.  Eat more omega-3 fats. These include salmon, mackerel, sardines, tuna, flaxseed oil, and ground flaxseeds. Try to eat fish at least 2 times each week.  Check food labels. Avoid foods with trans fats or  high amounts of saturated fat.  Limit saturated fats. ? These are often found in animal products, such as meats, butter, and cream. ? These are also found in plant foods, such as palm oil, palm kernel oil, and coconut oil.  Avoid foods with partially hydrogenated oils in them. These have trans fats. Examples are stick margarine, some tub margarines, cookies, crackers, and other baked goods. What foods can I eat? Fruits All fresh, canned (in natural juice), or frozen fruits. Vegetables Fresh or frozen vegetables (raw, steamed, roasted, or grilled). Green salads. Grains Most grains. Choose whole wheat and whole grains most of the time. Rice and pasta, including brown rice and pastas made with whole wheat. Meats and other proteins Lean, well-trimmed beef, veal, pork, and lamb. Chicken and [place] without skin. All fish and shellfish. Wild duck, rabbit, pheasant, and venison. Egg whites or low-cholesterol egg substitutes. Dried beans, peas, lentils, and tofu. Seeds and most nuts. Dairy Low-fat or nonfat cheeses, including ricotta and mozzarella. Skim or 1% milk that is liquid, powdered, or evaporated. Buttermilk that is made with low-fat milk. Nonfat or low-fat yogurt. Fats and oils Non-hydrogenated (trans-free) margarines. Vegetable oils, including soybean, sesame, sunflower, olive, peanut, safflower, corn, canola, and cottonseed. Salad dressings or mayonnaise made with a vegetable oil. Beverages Mineral water. Coffee and tea. Diet carbonated beverages. Sweets and desserts Sherbet, gelatin, and fruit ice. Small amounts of dark chocolate. Limit all sweets and desserts. Seasonings and condiments All seasonings and condiments. The items listed above may not be a complete list of foods and drinks you can eat. Contact a dietitian for more options. What foods should I avoid? Fruits Canned fruit in heavy syrup. Fruit in cream or butter sauce. Fried fruit. Limit coconut. Vegetables Vegetables  cooked in cheese, cream, or butter sauce. Fried vegetables. Grains Breads that are made with saturated or trans fats, oils, or whole milk. Croissants. Sweet rolls. Donuts. High-fat crackers, such as cheese crackers. Meats and other proteins Fatty meats, such as hot dogs, ribs, sausage, bacon, rib-eye roast or steak. High-fat deli meats, such as salami and bologna. Caviar. Domestic duck and goose. Organ meats, such as liver. Dairy Cream, sour cream, cream cheese, and creamed cottage cheese. Whole-milk cheeses. Whole or 2% milk that is liquid, evaporated, or condensed. Whole buttermilk. Cream sauce or high-fat cheese sauce. Yogurt that is made from whole milk. Fats and oils Meat fat, or shortening. Cocoa butter, hydrogenated oils, palm oil, coconut oil, palm kernel oil. Solid fats and shortenings, including bacon fat, salt pork, lard, and butter. Nondairy cream substitutes. Salad dressings with cheese or sour cream. Beverages Regular sodas and juice drinks with added sugar. Sweets and desserts Frosting. Pudding. Cookies. Cakes. Pies. Milk chocolate or white chocolate. Buttered syrups. Full-fat ice cream or ice cream drinks. The items listed above may not be a complete list of foods and drinks to avoid. Contact a dietitian for more information. Summary  Heart-healthy meal planning includes eating less unhealthy  fats, eating more healthy fats, and making other changes in your diet.  Eat a balanced diet. This includes fruits and vegetables, low-fat or nonfat dairy, lean protein, nuts and legumes, whole grains, and heart-healthy oils and fats. This information is not intended to replace advice given to you by your health care provider. Make sure you discuss any questions you have with your health care provider. Document Released: 02/05/2012 Document Revised: 10/10/2017 Document Reviewed: 09/13/2017 Elsevier Patient Education  2020 [employer].

## 2019-06-18 MED FILL — ?ROSUVASTATIN CALCIUM 10 MG: 10 | 30 days supply | Qty: 30 | Fill #4

## 2019-06-18 MED FILL — CARTIA XT 300 MG CAPSULE SA: 300 | 30 days supply | Qty: 30 | Fill #1

## 2019-07-13 MED FILL — CARTIA XT 300 MG CAPSULE SA: 300 | 30 days supply | Qty: 30 | Fill #2

## 2019-07-13 MED FILL — ?ROSUVASTATIN CALCIUM 10 MG: 10 | 30 days supply | Qty: 30 | Fill #5

## 2019-08-17 MED FILL — DILTIAZEM HCL ER COATED BEA: 300 | 30 days supply | Qty: 30 | Fill #0

## 2019-08-17 MED FILL — ?ROSUVASTATIN CALCIUM 10 MG: 10 | 30 days supply | Qty: 30 | Fill #6

## 2019-09-14 MED FILL — DILTIAZEM HCL ER COATED BEA: 300 | 30 days supply | Qty: 30 | Fill #1

## 2019-09-14 MED FILL — ?ROSUVASTATIN CALCIUM 10 MG: 10 | 30 days supply | Qty: 30 | Fill #7

## 2019-10-13 MED FILL — ?ROSUVASTATIN CALCIUM 10 MG: 10 | 30 days supply | Qty: 30 | Fill #8

## 2019-10-13 MED FILL — DILTIAZEM HCL ER COATED BEA: 300 | 30 days supply | Qty: 30 | Fill #2

## 2019-11-17 ENCOUNTER — Other Ambulatory Visit: Payer: Self-pay | Admitting: Interventional Cardiology

## 2019-11-17 DIAGNOSIS — I4891 Unspecified atrial fibrillation: Secondary | ICD-10-CM

## 2019-11-17 MED ORDER — DILTIAZEM HCL ER COATED BEADS 300 MG PO CP24: ORAL_CAPSULE | ORAL | 1 refills | Status: DC

## 2019-11-17 MED FILL — ROSUVASTATIN CALCIUM 10 MG: 10 | 30 days supply | Qty: 30 | Fill #9

## 2019-11-17 MED FILL — CARTIA XT 300 MG CAPSULE SA: 300 | 30 days supply | Qty: 30 | Fill #0

## 2019-12-15 MED FILL — ROSUVASTATIN CALCIUM 10 MG: 10 | 30 days supply | Qty: 30 | Fill #10

## 2019-12-15 MED FILL — CARTIA XT 300 MG CAPSULE SA: 300 | 30 days supply | Qty: 30 | Fill #1

## 2020-01-11 MED FILL — ?ROSUVASTATIN CALCIUM 10 MG: 10 | 30 days supply | Qty: 30 | Fill #11

## 2020-01-11 MED FILL — DILTIAZEM HCL ER COATED BEA: 300 | 30 days supply | Qty: 30 | Fill #2

## 2020-02-11 MED FILL — DILTIAZEM HCL ER COATED BEA: 300 | 30 days supply | Qty: 30 | Fill #3

## 2020-02-11 MED FILL — ROSUVASTATIN CALCIUM 10 MG: 10 | 30 days supply | Qty: 30 | Fill #0

## 2020-03-14 ENCOUNTER — Other Ambulatory Visit: Payer: Self-pay | Admitting: Family Medicine

## 2020-03-14 DIAGNOSIS — I6523 Occlusion and stenosis of bilateral carotid arteries: Secondary | ICD-10-CM

## 2020-03-14 DIAGNOSIS — I4891 Unspecified atrial fibrillation: Secondary | ICD-10-CM

## 2020-03-14 MED FILL — ROSUVASTATIN CALCIUM 10 MG: 10 | 30 days supply | Qty: 30 | Fill #1

## 2020-03-14 MED FILL — CARTIA XT 300 MG CAPSULE SA: 300 | 30 days supply | Qty: 30 | Fill #4

## 2020-03-14 NOTE — Telephone Encounter (Signed)
Medication: diltiazem (CARTIA XT) 300 MG 24 hr capsule [[id number]], rosuvastatin (CRESTOR) 10 MG tablet [[id number]] ,   Has the patient contacted their pharmacy? Yes  (Agent: If no, request that the patient contact the pharmacy for the refill.) (Agent: If yes, when and what did the pharmacy advise?)  Preferred Pharmacy (with phone number or street name): [facility] - [place], [place] - [address]  Phone:  [phone] Fax:  [phone]     Agent: Please be advised that RX refills may take up to 3 business days. We ask that you follow-up with your pharmacy.

## 2020-04-15 MED FILL — ?ROSUVASTATIN CALCIUM 10 MG: 10 | 30 days supply | Qty: 30 | Fill #2

## 2020-04-15 MED FILL — CARTIA XT 300 MG CAPSULE SA: 300 | 30 days supply | Qty: 30 | Fill #5

## 2020-04-27 ENCOUNTER — Ambulatory Visit: Payer: Self-pay | Admitting: Physician Assistant

## 2020-04-27 ENCOUNTER — Ambulatory Visit: Payer: Medicaid Other | Admitting: Family Medicine

## 2020-05-11 ENCOUNTER — Other Ambulatory Visit: Payer: Self-pay

## 2020-05-11 ENCOUNTER — Encounter: Payer: Self-pay | Admitting: Family Medicine

## 2020-05-11 ENCOUNTER — Other Ambulatory Visit: Payer: Self-pay | Admitting: Family Medicine

## 2020-05-11 ENCOUNTER — Other Ambulatory Visit: Payer: Self-pay | Admitting: Interventional Cardiology

## 2020-05-11 ENCOUNTER — Ambulatory Visit: Payer: Self-pay | Attending: Family Medicine | Admitting: Family Medicine

## 2020-05-11 VITALS — BP 106/68 | HR 73 | Ht 66.0 in | Wt 139.8 lb

## 2020-05-11 DIAGNOSIS — R296 Repeated falls: Secondary | ICD-10-CM | POA: Insufficient documentation

## 2020-05-11 DIAGNOSIS — R2689 Other abnormalities of gait and mobility: Secondary | ICD-10-CM | POA: Insufficient documentation

## 2020-05-11 DIAGNOSIS — J449 Chronic obstructive pulmonary disease, unspecified: Secondary | ICD-10-CM | POA: Insufficient documentation

## 2020-05-11 DIAGNOSIS — Z79899 Other long term (current) drug therapy: Secondary | ICD-10-CM | POA: Insufficient documentation

## 2020-05-11 DIAGNOSIS — F329 Major depressive disorder, single episode, unspecified: Secondary | ICD-10-CM | POA: Insufficient documentation

## 2020-05-11 DIAGNOSIS — F419 Anxiety disorder, unspecified: Secondary | ICD-10-CM

## 2020-05-11 DIAGNOSIS — I4891 Unspecified atrial fibrillation: Secondary | ICD-10-CM

## 2020-05-11 DIAGNOSIS — Z639 Problem related to primary support group, unspecified: Secondary | ICD-10-CM | POA: Insufficient documentation

## 2020-05-11 DIAGNOSIS — I6523 Occlusion and stenosis of bilateral carotid arteries: Secondary | ICD-10-CM

## 2020-05-11 DIAGNOSIS — F17210 Nicotine dependence, cigarettes, uncomplicated: Secondary | ICD-10-CM | POA: Insufficient documentation

## 2020-05-11 DIAGNOSIS — L84 Corns and callosities: Secondary | ICD-10-CM

## 2020-05-11 DIAGNOSIS — F411 Generalized anxiety disorder: Secondary | ICD-10-CM | POA: Insufficient documentation

## 2020-05-11 DIAGNOSIS — Z7982 Long term (current) use of aspirin: Secondary | ICD-10-CM | POA: Insufficient documentation

## 2020-05-11 MED ORDER — ROSUVASTATIN CALCIUM 10 MG PO TABS: 10.0000 mg | ORAL_TABLET | Freq: Every day | ORAL | 1 refills | Status: DC

## 2020-05-11 MED ORDER — SPIRIVA HANDIHALER 18 MCG IN CAPS: ORAL_CAPSULE | RESPIRATORY_TRACT | 6 refills | Status: DC

## 2020-05-11 MED ORDER — DULOXETINE HCL 60 MG PO CPEP: 60.0000 mg | ORAL_CAPSULE | Freq: Every day | ORAL | 3 refills | Status: DC

## 2020-05-11 MED ORDER — DILTIAZEM HCL ER COATED BEADS 300 MG PO CP24: ORAL_CAPSULE | ORAL | 0 refills | Status: DC

## 2020-05-11 MED FILL — DULoxetine HCL 60 MG CPEP: 60 | 30 days supply | Qty: 30 | Fill #0

## 2020-05-11 NOTE — Progress Notes (Signed)
Name: Jarvis MorganLinda B Takeda   MRN: [id number]    DOB: [birth year]   Date:05/11/2020       Progress Note  Subjective  Chief Complaint  Chief Complaint  Patient presents with  . COPD    HPI  Carrie Case is a 64 year old female that has a history of atrial fibrillation, COPD, bilateral carotid artery stenosis, and tobacco dependency. She is being seen today due to complaint of frequent falls.  Falls: States her balance problem began a year ago. She has had 15-20 falls in the past year. She denies any dizziness, lightheadedness, or vertigo. She states that her falls are not related to position changes. States she could be walking and all of a sudden she falls. She does not lose consciousness when she falls. She has fallen at home, at her son's wedding, and at stores. She has sustained some bruising related to falls, but no bruising currently. She lives alone. She does not have any gait issues and does not use any assistive devices. She denies any visual or hearing changes. Denies alcohol use. She states she had a hardened area on her right heel that might be contributing to her falls. She states she tried to make an appointment with her PCP regarding her falls earlier, however she was not able to make it to the appointment due to family problems.  COPD: States she does not have any cough or wheezing. Has some dyspnea when she walks for a long time or climbs up several flights of stairs. Takes daily Spiriva. Feels like her symptoms are controlled. States she has an appointment with her pulmonologist in November. She is a current smoker, 3 packs per day. Tried Wellbutrin but states she smoked more with it. She is not interested in quitting.  Atrial fibrillation: Taking diltiazem. Has a appointment with cardiology on Friday. Denies any palpitations or chest pain.   Anxiety/Depression: Reports increased nervousness and depressed mood. States she is concerned about being able to support herself financially.  States that she has not been able to sleep due to all the thoughts in her mind. States that sometimes when she is sleeping she has severe pain in a part of her body and this pain changes location. Denies any numbness or tingling associated with pain. The only time she is able to sleep is if she takes her friend's Xanax. She denies any suicidal or homicidal ideation.  PHQ2/9: [facility] 05/11/2020 02/03/2019 01/30/2019 01/05/2019  Decreased Interest 3 0 0 2  Down, Depressed, Hopeless 3 0 0 2  PHQ - 2 Score 6 0 0 4  Altered sleeping 3 0 0 3  Tired, decreased energy 3 0 0 3  Change in appetite 3 0 0 3  Feeling bad or failure about yourself  2 0 0 3  Trouble concentrating 2 0 0 1  Moving slowly or fidgety/restless 0 0 0 0  Suicidal thoughts 0 0 0 0  PHQ-9 Score 19 0 0 17  Difficult doing work/chores - - Not difficult at all Somewhat difficult   GAD 7 : Generalized Anxiety Score 05/11/2020 01/30/2019 01/05/2019  Nervous, Anxious, on Edge 3 0 2  Control/stop worrying 3 0 2  Worry too much - different things 3 0 2  Trouble relaxing 3 0 3  Restless 2 0 1  Easily annoyed or irritable 3 0 3  Afraid - awful might happen 2 0 1  Total GAD 7 Score 19 0 14  Anxiety Difficulty - Not difficult at  all Somewhat difficult     Patient Active Problem List   Diagnosis Date Noted  . Bilateral carotid artery stenosis 02/03/2019  . COPD with chronic bronchitis (HCC) 01/13/2019  . Tobacco dependence 12/22/2018  . Atrial fibrillation with RVR (HCC) 12/22/2018    Past Medical History:  Diagnosis Date  . Acute respiratory failure with hypoxia (HCC) 12/22/2018  . Tobacco dependence     Past Surgical History:  Procedure Laterality Date  . TUBAL LIGATION      Social History   Tobacco Use  . Smoking status: Current Every Day Smoker    Packs/day: 0.50    Years: 47.00    Pack years: 23.50    Types: Cigarettes  . Smokeless tobacco: Never Used  Substance Use Topics  . Alcohol use: No      Current Outpatient Medications:  .  albuterol (PROVENTIL) (2.5 MG/3ML) 0.083% nebulizer solution, Take 3 mLs (2.5 mg total) by nebulization every 6 (six) hours as needed for wheezing or shortness of breath., Disp: 75 mL, Rfl: 2 .  albuterol (VENTOLIN HFA) 108 (90 Base) MCG/ACT inhaler, Inhale 1-2 puffs into the lungs every 6 (six) hours as needed for wheezing or shortness of breath., Disp: 18 g, Rfl: 0 .  aspirin EC 81 MG tablet, Take 1 tablet (81 mg total) by mouth daily., Disp: 90 tablet, Rfl: 3 .  diltiazem (CARTIA XT) 300 MG 24 hr capsule, TAKE 1 CAPSULE (300 MG TOTAL) BY MOUTH DAILY., Disp: 90 capsule, Rfl: 1 .  rosuvastatin (CRESTOR) 10 MG tablet, Take 1 tablet (10 mg total) by mouth daily. To decrease lipids, Disp: 30 tablet, Rfl: 11 .  benzonatate (TESSALON) 100 MG capsule, Take 1 capsule (100 mg total) by mouth 3 (three) times daily as needed for cough. (Patient not taking: Reported on 05/11/2020), Disp: 30 capsule, Rfl: 0 .  buPROPion (WELLBUTRIN SR) 150 MG 12 hr tablet, Take 1 tablet (150 mg total) by mouth daily. (Patient not taking: Reported on 05/11/2020), Disp: 30 tablet, Rfl: 3 .  DULoxetine (CYMBALTA) 60 MG capsule, Take 1 capsule (60 mg total) by mouth daily., Disp: 30 capsule, Rfl: 3 .  tiotropium (SPIRIVA HANDIHALER) 18 MCG inhalation capsule, PLACE 1 CAPSULE (18 MCG TOTAL) INTO INHALER AND INHALE DAILY., Disp: 30 capsule, Rfl: 6  Allergies  Allergen Reactions  . Ibuprofen Hives    Review of Systems  Constitutional: Negative for chills, fever and weight loss.  HENT: Negative for ear pain, hearing loss and tinnitus.   Eyes: Negative for blurred vision and double vision.       Wears glasses  Respiratory: Negative for cough, sputum production and shortness of breath.   Cardiovascular: Negative for chest pain and palpitations.  Gastrointestinal: Negative for heartburn, nausea and vomiting.  Musculoskeletal: Positive for joint pain.       Generalized, mostly, hips and  knees  Neurological: Negative for dizziness, tingling, seizures, loss of consciousness, weakness and headaches.  Psychiatric/Behavioral: Positive for depression. Negative for suicidal ideas. The patient is nervous/anxious and has insomnia.     No other specific complaints in a complete review of systems (except as listed in HPI above).  Objective  Vitals:   05/11/20 1521  BP: 106/68  Pulse: 73  SpO2: 98%  Weight: 139 lb 12.8 oz (63.4 kg)  Height: 5\' 6"  (1.676 m)    Body mass index is 22.56 kg/m.  Nursing Note and Vital Signs reviewed.  Physical Exam Constitutional:      Appearance: Normal appearance.  HENT:  Head: Normocephalic and atraumatic.  Eyes:     General: Lids are normal.     Extraocular Movements: Extraocular movements intact.  Neck:     Vascular: Carotid bruit present.     Comments: Right > left Cardiovascular:     Rate and Rhythm: Normal rate and regular rhythm.     Heart sounds: Normal heart sounds, S1 normal and S2 normal.  Pulmonary:     Effort: Pulmonary effort is normal.     Breath sounds: Normal breath sounds. No wheezing or rhonchi.  Musculoskeletal:     Right lower leg: Normal.     Left lower leg: Normal.     Right foot: Normal range of motion.     Left foot: Normal range of motion.     Comments: Muscle strength 5/5 bilaterally  Feet:     Comments: Hardened, raised area on right heel; tenderness with palpation Neurological:     General: No focal deficit present.     Mental Status: She is alert and oriented to person, place, and time.     Motor: No tremor.     Coordination: Coordination is intact. Romberg sign negative. Coordination normal. Finger-Nose-Finger Test and Heel to [facility] Test normal.     Gait: Gait is intact. Tandem walk normal.     Comments: Grip strength intact    CMP     Component Value Date/Time   NA 135 01/13/2019 1644   K 4.8 01/13/2019 1644   CL 94 (L) 01/13/2019 1644   CO2 29 01/13/2019 1644   GLUCOSE 90 01/13/2019  1644   GLUCOSE 116 (H) 12/24/2018 1916   BUN 5 (L) 01/13/2019 1644   CREATININE 0.77 01/13/2019 1644   CALCIUM 9.8 01/13/2019 1644   PROT 7.0 01/13/2019 1644   ALBUMIN 4.0 01/13/2019 1644   AST 11 01/13/2019 1644   ALT 11 01/13/2019 1644   ALKPHOS 110 01/13/2019 1644   BILITOT 0.3 01/13/2019 1644   GFRNONAA 84 01/13/2019 1644   GFRAA 96 01/13/2019 1644   Lipid Panel     Component Value Date/Time   CHOL 125 12/23/2018 0633   TRIG 190 (H) 12/23/2018 0633   HDL 26 (L) 12/23/2018 0633   CHOLHDL 4.8 12/23/2018 0633   VLDL 38 12/23/2018 0633   LDLCALC 61 12/23/2018 0633    Assessment & Plan  1. Anxiety and depression Uncontrolled Her PHQ-9 and GAD-7 are both elevated revealing severe anxiety and depression. Patient agrees with prescription to help combat anxiety and depression. Educated patient that medication will not fix anxiety and depression. Educated about limiting stressors to help improve symptoms. Educated patient about the dangers of taking friend's Xanax. - DULoxetine (CYMBALTA) 60 MG capsule; Take 1 capsule (60 mg total) by mouth daily.  Dispense: 30 capsule; Refill: 3  2. Balance problem Balance problem does not appear to be from a neurological or orthopedic cause. Will try physical therapy to see if this helps with her frequent falls. Will draw CBC and BMP to assess for other causes of falls. Will follow-up in 1 month and assess if PT is effective in reducing falls, if not will assess for other causes of falls. - CBC with Differential/Platelet - Basic Metabolic Panel - Ambulatory referral to Physical Therapy  3. Corn May be contributing to her falls. Will have podiatry assess. - Ambulatory referral to Podiatry  4. Atrial fibrillation with RVR (HCC) Stable. Regular rate and rhythm on examination. Continue diltiazem. Has an appointment with cardiology Friday.  5. COPD with chronic  bronchitis (HCC) Stable.  Encourage smoking cessation and medication  adherence. Declines influenza vaccine. - tiotropium (SPIRIVA HANDIHALER) 18 MCG inhalation capsule; PLACE 1 CAPSULE (18 MCG TOTAL) INTO INHALER AND INHALE DAILY.  Dispense: 30 capsule; Refill: 6  Return in about 1 month (around 06/10/2020) for PCP Dr Jillyn Hidden for follow up on falls.  Evaluation and management procedures were performed by me with NP Student in attendance, note written by NP student under my supervision and collaboration. I have reviewed the note and I agree with the management and plan.  Ms Nop has not been seen by her PCP and cardiologist in over 1 year and presents today with recurrent falls dating back to 1 year ago.  Cerebellar tests are normal and etiology of her fall is unclear.  She does have a history of carotid stenosis but symptoms are not in keeping with syncopal episodes or intracranial pathology.  We will commence with PT referral at this time and order labs to evaluate. She does have upcoming appointment with her cardiologist in 2 days for follow-up of her A. fib and carotid stenosis. She has been commenced on an antidepressant and will need to be followed up by her PCP for depression monitoring.  Hoy Register, MD, FAAFP. [facility] [place], [place] [phone]   05/11/2020, 5:04 PM

## 2020-05-11 NOTE — Progress Notes (Signed)
Not sleeping at night  Having problems with right heel.  States that she has frequent fall.  Needs refills.

## 2020-05-11 NOTE — Patient Instructions (Signed)

## 2020-05-11 NOTE — Telephone Encounter (Signed)
Requested medication (s) are due for refill today: Yes  Requested medication (s) are on the active medication list: Yes  Last refill:  Crestor 05/04/19  Cartia XT  11/17/19  Future visit scheduled: Yes  Notes to clinic:  Last prescribed by Dr. Eldridge Dace.    Requested Prescriptions  Pending Prescriptions Disp Refills   rosuvastatin (CRESTOR) 10 MG tablet [Pharmacy Med Name: ROSUVASTATIN CALCIUM 10 MG 10 Tablet] 30 tablet 11    Sig: Take 1 tablet (10 mg total) by mouth daily. To decrease lipids      There is no refill protocol information for this order      CARTIA XT 300 MG 24 hr capsule [Pharmacy Med Name: CARTIA XT 300 MG CAPSULE SA 300 Capsule] 30 capsule 1    Sig: TAKE 1 CAPSULE (300 MG TOTAL) BY MOUTH DAILY.      There is no refill protocol information for this order

## 2020-05-12 ENCOUNTER — Telehealth: Payer: Self-pay

## 2020-05-12 LAB — CBC WITH DIFFERENTIAL/PLATELET
Basophils Absolute: 0.1 x10E3/uL (ref 0.0–0.2)
Basos: 1 %
EOS (ABSOLUTE): 0.2 x10E3/uL (ref 0.0–0.4)
Eos: 2 %
Hematocrit: 41.9 % (ref 34.0–46.6)
Hemoglobin: 14.4 g/dL (ref 11.1–15.9)
Immature Grans (Abs): 0 x10E3/uL (ref 0.0–0.1)
Immature Granulocytes: 0 %
Lymphocytes Absolute: 2.4 x10E3/uL (ref 0.7–3.1)
Lymphs: 32 %
MCH: 31.9 pg (ref 26.6–33.0)
MCHC: 34.4 g/dL (ref 31.5–35.7)
MCV: 93 fL (ref 79–97)
Monocytes Absolute: 0.9 x10E3/uL (ref 0.1–0.9)
Monocytes: 12 %
Neutrophils Absolute: 3.9 x10E3/uL (ref 1.4–7.0)
Neutrophils: 53 %
Platelets: 304 x10E3/uL (ref 150–450)
RBC: 4.51 x10E6/uL (ref 3.77–5.28)
RDW: 13.6 % (ref 11.7–15.4)
WBC: 7.5 x10E3/uL (ref 3.4–10.8)

## 2020-05-12 LAB — BASIC METABOLIC PANEL WITH GFR
BUN/Creatinine Ratio: 13 (ref 12–28)
BUN: 9 mg/dL (ref 8–27)
CO2: 27 mmol/L (ref 20–29)
Calcium: 9.5 mg/dL (ref 8.7–10.3)
Chloride: 98 mmol/L (ref 96–106)
Creatinine, Ser: 0.68 mg/dL (ref 0.57–1.00)
GFR calc Af Amer: 108 mL/min/1.73
GFR calc non Af Amer: 93 mL/min/1.73
Glucose: 88 mg/dL (ref 65–99)
Potassium: 4.2 mmol/L (ref 3.5–5.2)
Sodium: 135 mmol/L (ref 134–144)

## 2020-05-12 MED FILL — ROSUVASTATIN CALCIUM 10 MG: 10 | 30 days supply | Qty: 30 | Fill #0

## 2020-05-12 MED FILL — CARTIA XT 300 MG CAPSULE SA: 300 | 30 days supply | Qty: 30 | Fill #0

## 2020-05-12 NOTE — Telephone Encounter (Signed)
-----   Message from Enobong Newlin, MD sent at 05/12/2020 12:42 PM EDT ----- Please inform the patient that labs are normal. Thank you. 

## 2020-05-12 NOTE — Telephone Encounter (Signed)
Patient was called and a voicemail was left informing patient to return phone call for lab results. 

## 2020-05-13 ENCOUNTER — Ambulatory Visit: Payer: Self-pay | Admitting: Physician Assistant

## 2020-05-18 NOTE — Progress Notes (Signed)
Virtual Visit via Telephone Note   This visit type was conducted due to national recommendations for restrictions regarding the COVID-19 Pandemic (e.g. social distancing) in an effort to limit this patient's exposure and mitigate transmission in our community.  Due to her co-morbid illnesses, this patient is at least at moderate risk for complications without adequate follow up.  This format is felt to be most appropriate for this patient at this time.  The patient did not have access to video technology/had technical difficulties with video requiring transitioning to audio format only (telephone).  All issues noted in this document were discussed and addressed.  No physical exam could be performed with this format.  Please refer to the patient's chart for her  consent to telehealth for [facility].    Date:  05/24/2020   ID:  [name], DOB 04-07-1956, MRN [id number] The patient was identified using 2 identifiers.  Patient Location: Home Provider Location: Office/Clinic  PCP:  Cain Saupe, MD  Cardiologist:  Lance Muss, MD  Electrophysiologist:  None   Evaluation Performed:  Follow-Up Visit  Chief Complaint:  Follow up  History of Present Illness:    Carrie Case is a 64 y.o. female with with history of PAF 12/2018 was in normal sinus rhythm last office visit 04/2019, score equals 1 so no anticoagulation, on aspirin alone, tobacco abuse, fatigue resolved with stopping metoprolol, carotid bruit, obesity.  Last saw Dr. Eldridge Dace 05/04/2019 recommended weight loss, carotid Dopplers 40 to 59% left ICA started on statin.  Patient denies any symptoms of palpitations, dyspnea, chest pain, dizziness. Biggest complaint is insomnia.   The patient does not have symptoms concerning for COVID-19 infection (fever, chills, cough, or new shortness of breath).    Past Medical History:  Diagnosis Date  . Acute respiratory failure with hypoxia (HCC) 12/22/2018  . Tobacco  dependence    Past Surgical History:  Procedure Laterality Date  . TUBAL LIGATION       Current Meds  Medication Sig  . albuterol (PROVENTIL) (2.5 MG/3ML) 0.083% nebulizer solution Take 3 mLs (2.5 mg total) by nebulization every 6 (six) hours as needed for wheezing or shortness of breath.  Marland Kitchen albuterol (VENTOLIN HFA) 108 (90 Base) MCG/ACT inhaler Inhale 1-2 puffs into the lungs every 6 (six) hours as needed for wheezing or shortness of breath.  Marland Kitchen aspirin EC 81 MG tablet Take 1 tablet (81 mg total) by mouth daily.  Marland Kitchen diltiazem (CARTIA XT) 300 MG 24 hr capsule TAKE 1 CAPSULE (300 MG TOTAL) BY MOUTH DAILY. Please keep upcoming appt in October before anymore refills. Thank you  . DULoxetine (CYMBALTA) 60 MG capsule Take 1 capsule (60 mg total) by mouth daily.  . rosuvastatin (CRESTOR) 10 MG tablet Take 1 tablet (10 mg total) by mouth daily. To decrease lipids. Please keep upcoming appt in October for future refills. Thank you  . tiotropium (SPIRIVA HANDIHALER) 18 MCG inhalation capsule PLACE 1 CAPSULE (18 MCG TOTAL) INTO INHALER AND INHALE DAILY. (Patient taking differently: as needed. PLACE 1 CAPSULE (18 MCG TOTAL) INTO INHALER AND INHALE DAILY.)     Allergies:   Ibuprofen   Social History   Tobacco Use  . Smoking status: Current Every Day Smoker    Packs/day: 0.50    Years: 47.00    Pack years: 23.50    Types: Cigarettes  . Smokeless tobacco: Never Used  Vaping Use  . Vaping Use: Never used  Substance Use Topics  . Alcohol use: No  .  Drug use: No     Family Hx: The patient's family history includes Anxiety disorder in her mother; COPD in her mother; Cancer in her mother; Hyperlipidemia in her mother; Hypertension in her mother.  ROS:   Please see the history of present illness.     All other systems reviewed and are negative.   Prior CV studies:   The following studies were reviewed today:  Carotid Dopplers 04/2019  Summary:  Right Carotid: Velocities in the right ICA  are consistent with a 1-39%  stenosis.   Left Carotid: Velocities in the left ICA are consistent with a 40-59%  stenosis.   Vertebrals: Right vertebral artery demonstrates antegrade flow. Left  vertebral              artery demonstrates bidirectional flow.- suggestive of  subclavian              steal.  Subclavians: Left subclavian artery was stenotic. Normal flow hemodynamics  were              seen in the right subclavian artery.   *See table(s) above for measurements and observations.      Electronically signed by Fabienne Bruns MD on 02/02/2019 at 5:14:13 PM.        Labs/Other Tests and Data Reviewed:    EKG:  No ECG reviewed.  Recent Labs: 05/11/2020: BUN 9; Creatinine, Ser 0.68; Hemoglobin 14.4; Platelets 304; Potassium 4.2; Sodium 135   Recent Lipid Panel Lab Results  Component Value Date/Time   CHOL 125 12/23/2018 06:33 AM   TRIG 190 (H) 12/23/2018 06:33 AM   HDL 26 (L) 12/23/2018 06:33 AM   CHOLHDL 4.8 12/23/2018 06:33 AM   LDLCALC 61 12/23/2018 06:33 AM    Wt Readings from Last 3 Encounters:  05/24/20 140 lb (63.5 kg)  05/11/20 139 lb 12.8 oz (63.4 kg)  05/04/19 125 lb 12.8 oz (57.1 kg)     Objective:    Vital Signs:  BP 106/68   Pulse 94   Ht 5\' 6"  (1.676 m)   Wt 140 lb (63.5 kg)   BMI 22.60 kg/m    VITAL SIGNS:  reviewed  ASSESSMENT & PLAN:    PAF CHA2DS2-VASc equals 1 so on aspirin, metoprolol stopped because of fatigue  Carotid stenosis on Dopplers 01/2019 started on statin need to repeat dopplers and FLP  Tobacco abuse-down to 2 pks per day from 4 pk/day. Smoking cessation essential for overall health  Educated on Covid vaccine.  Patient has been reluctant to get vaccinated but discussed the details with her and with her COPD and tobacco abuse she is at high risk if she contacts Covid.    COVID-19 Education: The signs and symptoms of COVID-19 were discussed with the patient and how to seek care for testing (follow up with PCP or  arrange E-visit).  The importance of social distancing was discussed today.  Time:   Today, I have spent   minutes with the patient with telehealth technology discussing the above problems.     Medication Adjustments/Labs and Tests Ordered: Current medicines are reviewed at length with the patient today.  Concerns regarding medicines are outlined above.   Tests Ordered: Orders Placed This Encounter  Procedures  . Lipid panel  . Hepatic function panel  . VAS 02/2019 CAROTID    Medication Changes: No orders of the defined types were placed in this encounter.   Follow Up:  In Person in 1 year(s) Dr. [place]  Signed, Abe People, PA-C  05/24/2020 10:41 AM    [facility]

## 2020-05-19 ENCOUNTER — Telehealth: Payer: Self-pay

## 2020-05-19 NOTE — Telephone Encounter (Signed)
LM asking pt to call back to let us know if she would like her appt with Jacolyn Reedy, PA on 05/24/2020 to be VV or if she would rather come in to the office. Our clinic for that day has switched from remote to In-office.

## 2020-05-24 ENCOUNTER — Other Ambulatory Visit: Payer: Self-pay

## 2020-05-24 ENCOUNTER — Encounter: Payer: Self-pay | Admitting: Physician Assistant

## 2020-05-24 ENCOUNTER — Telehealth (INDEPENDENT_AMBULATORY_CARE_PROVIDER_SITE_OTHER): Payer: Self-pay | Admitting: Physician Assistant

## 2020-05-24 ENCOUNTER — Other Ambulatory Visit: Payer: Self-pay | Admitting: Physician Assistant

## 2020-05-24 ENCOUNTER — Telehealth: Payer: Self-pay | Admitting: Physician Assistant

## 2020-05-24 VITALS — BP 106/68 | HR 94 | Ht 66.0 in | Wt 140.0 lb

## 2020-05-24 DIAGNOSIS — I6523 Occlusion and stenosis of bilateral carotid arteries: Secondary | ICD-10-CM

## 2020-05-24 DIAGNOSIS — I6522 Occlusion and stenosis of left carotid artery: Secondary | ICD-10-CM

## 2020-05-24 DIAGNOSIS — Z7189 Other specified counseling: Secondary | ICD-10-CM

## 2020-05-24 DIAGNOSIS — I4891 Unspecified atrial fibrillation: Secondary | ICD-10-CM

## 2020-05-24 DIAGNOSIS — I480 Paroxysmal atrial fibrillation: Secondary | ICD-10-CM

## 2020-05-24 DIAGNOSIS — Z720 Tobacco use: Secondary | ICD-10-CM

## 2020-05-24 MED ORDER — DILTIAZEM HCL ER COATED BEADS 300 MG PO CP24: ORAL_CAPSULE | ORAL | 3 refills | Status: DC

## 2020-05-24 MED ORDER — ROSUVASTATIN CALCIUM 10 MG PO TABS: 10.0000 mg | ORAL_TABLET | Freq: Every day | ORAL | 3 refills | Status: DC

## 2020-05-24 NOTE — Patient Instructions (Addendum)
Medication Instructions:  Your physician recommends that you continue on your current medications as directed. Please refer to the Current Medication list given to you today.  I sent in refills on your Rosuvastatin and Diltiazem  *If you need a refill on your cardiac medications before your next appointment, please call your pharmacy*   Lab Work: FLP, LFT Your physician recommends that you return for a FASTING lipid profile: You may have this done the same day as your Carotid Doppler  If you have labs (blood work) drawn today and your tests are completely normal, you will receive your results only by: Marland Kitchen MyChart Message (if you have MyChart) OR . A paper copy in the mail If you have any lab test that is abnormal or we need to change your treatment, we will call you to review the results.   Testing/Procedures: Your physician has requested that you have a carotid duplex. This test is an ultrasound of the carotid arteries in your neck. It looks at blood flow through these arteries that supply the brain with blood. Allow one hour for this exam. There are no restrictions or special instructions.     Follow-Up: At [facility], you and your health needs are our priority.  As part of our continuing mission to provide you with exceptional heart care, we have created designated Provider Care Teams.  These Care Teams include your primary Cardiologist (physician) and Advanced Practice Providers (APPs -  Physician Assistants and Nurse Practitioners) who all work together to provide you with the care you need, when you need it.  Your next appointment:   1 year(s)  The format for your next appointment:   In Person  Provider:   You may see Lance Muss, MD or one of the following Advanced Practice Providers on your designated Care Team:    Ronie Spies, PA-C  Jacolyn Reedy, PA-C    Other Instructions  Coping with Quitting Smoking  Quitting smoking is a physical and mental  challenge. You will face cravings, withdrawal symptoms, and temptation. Before quitting, work with your health care provider to make a plan that can help you cope. Preparation can help you quit and keep you from giving in. How can I cope with cravings? Cravings usually last for 5-10 minutes. If you get through it, the craving will pass. Consider taking the following actions to help you cope with cravings:  Keep your mouth busy: ? Chew sugar-free gum. ? Suck on hard candies or a straw. ? Brush your teeth.  Keep your hands and body busy: ? Immediately change to a different activity when you feel a craving. ? Squeeze or play with a ball. ? Do an activity or a hobby, like making bead jewelry, practicing needlepoint, or working with wood. ? Mix up your normal routine. ? Take a short exercise break. Go for a quick walk or run up and down stairs. ? Spend time in public places where smoking is not allowed.  Focus on doing something kind or helpful for someone else.  Call a friend or family member to talk during a craving.  Join a support group.  Call a quit line, such as 1-800-QUIT-NOW.  Talk with your health care provider about medicines that might help you cope with cravings and make quitting easier for you. How can I deal with withdrawal symptoms? Your body may experience negative effects as it tries to get used to not having nicotine in the system. These effects are called withdrawal symptoms. They may  include:  Feeling hungrier than normal.  Trouble concentrating.  Irritability.  Trouble sleeping.  Feeling depressed.  Restlessness and agitation.  Craving a cigarette. To manage withdrawal symptoms:  Avoid places, people, and activities that trigger your cravings.  Remember why you want to quit.  Get plenty of sleep.  Avoid coffee and other caffeinated drinks. These may worsen some of your symptoms. How can I handle social situations? Social situations can be difficult  when you are quitting smoking, especially in the first few weeks. To manage this, you can:  Avoid parties, bars, and other social situations where people might be smoking.  Avoid alcohol.  Leave right away if you have the urge to smoke.  Explain to your family and friends that you are quitting smoking. Ask for understanding and support.  Plan activities with friends or family where smoking is not an option. What are some ways I can cope with stress? Wanting to smoke may cause stress, and stress can make you want to smoke. Find ways to manage your stress. Relaxation techniques can help. For example:  Breathe slowly and deeply, in through your nose and out through your mouth.  Listen to soothing, relaxing music.  Talk with a family member or friend about your stress.  Light a candle.  Soak in a bath or take a shower.  Think about a peaceful place. What are some ways I can prevent weight gain? Be aware that many people gain weight after they quit smoking. However, not everyone does. To keep from gaining weight, have a plan in place before you quit and stick to the plan after you quit. Your plan should include:  Having healthy snacks. When you have a craving, it may help to: ? Eat plain popcorn, crunchy carrots, celery, or other cut vegetables. ? Chew sugar-free gum.  Changing how you eat: ? Eat small portion sizes at meals. ? Eat 4-6 small meals throughout the day instead of 1-2 large meals a day. ? Be mindful when you eat. Do not watch television or do other things that might distract you as you eat.  Exercising regularly: ? Make time to exercise each day. If you do not have time for a long workout, do short bouts of exercise for 5-10 minutes several times a day. ? Do some form of strengthening exercise, like weight lifting, and some form of aerobic exercise, like running or swimming.  Drinking plenty of water or other low-calorie or no-calorie drinks. Drink 6-8 glasses of  water daily, or as much as instructed by your health care provider. Summary  Quitting smoking is a physical and mental challenge. You will face cravings, withdrawal symptoms, and temptation to smoke again. Preparation can help you as you go through these challenges.  You can cope with cravings by keeping your mouth busy (such as by chewing gum), keeping your body and hands busy, and making calls to family, friends, or a helpline for people who want to quit smoking.  You can cope with withdrawal symptoms by avoiding places where people smoke, avoiding drinks with caffeine, and getting plenty of rest.  Ask your health care provider about the different ways to prevent weight gain, avoid stress, and handle social situations. This information is not intended to replace advice given to you by your health care provider. Make sure you discuss any questions you have with your health care provider. Document Revised: 07/19/2017 Document Reviewed: 08/03/2016 Elsevier Patient Education  2020 [employer].

## 2020-05-24 NOTE — Telephone Encounter (Signed)
Left vm for patient to call back about a carotid duplex to get that appt scheduled.

## 2020-05-26 IMAGING — DX PORTABLE CHEST - 1 VIEW
1 series · 1 of 1 positions shown · non-contrast
Comparison: 07/05/2013 chest radiograph.

CLINICAL DATA: 62 y/o  F; bronchi and tachypnea.

EXAM:
PORTABLE CHEST 1 VIEW

[chest ap]
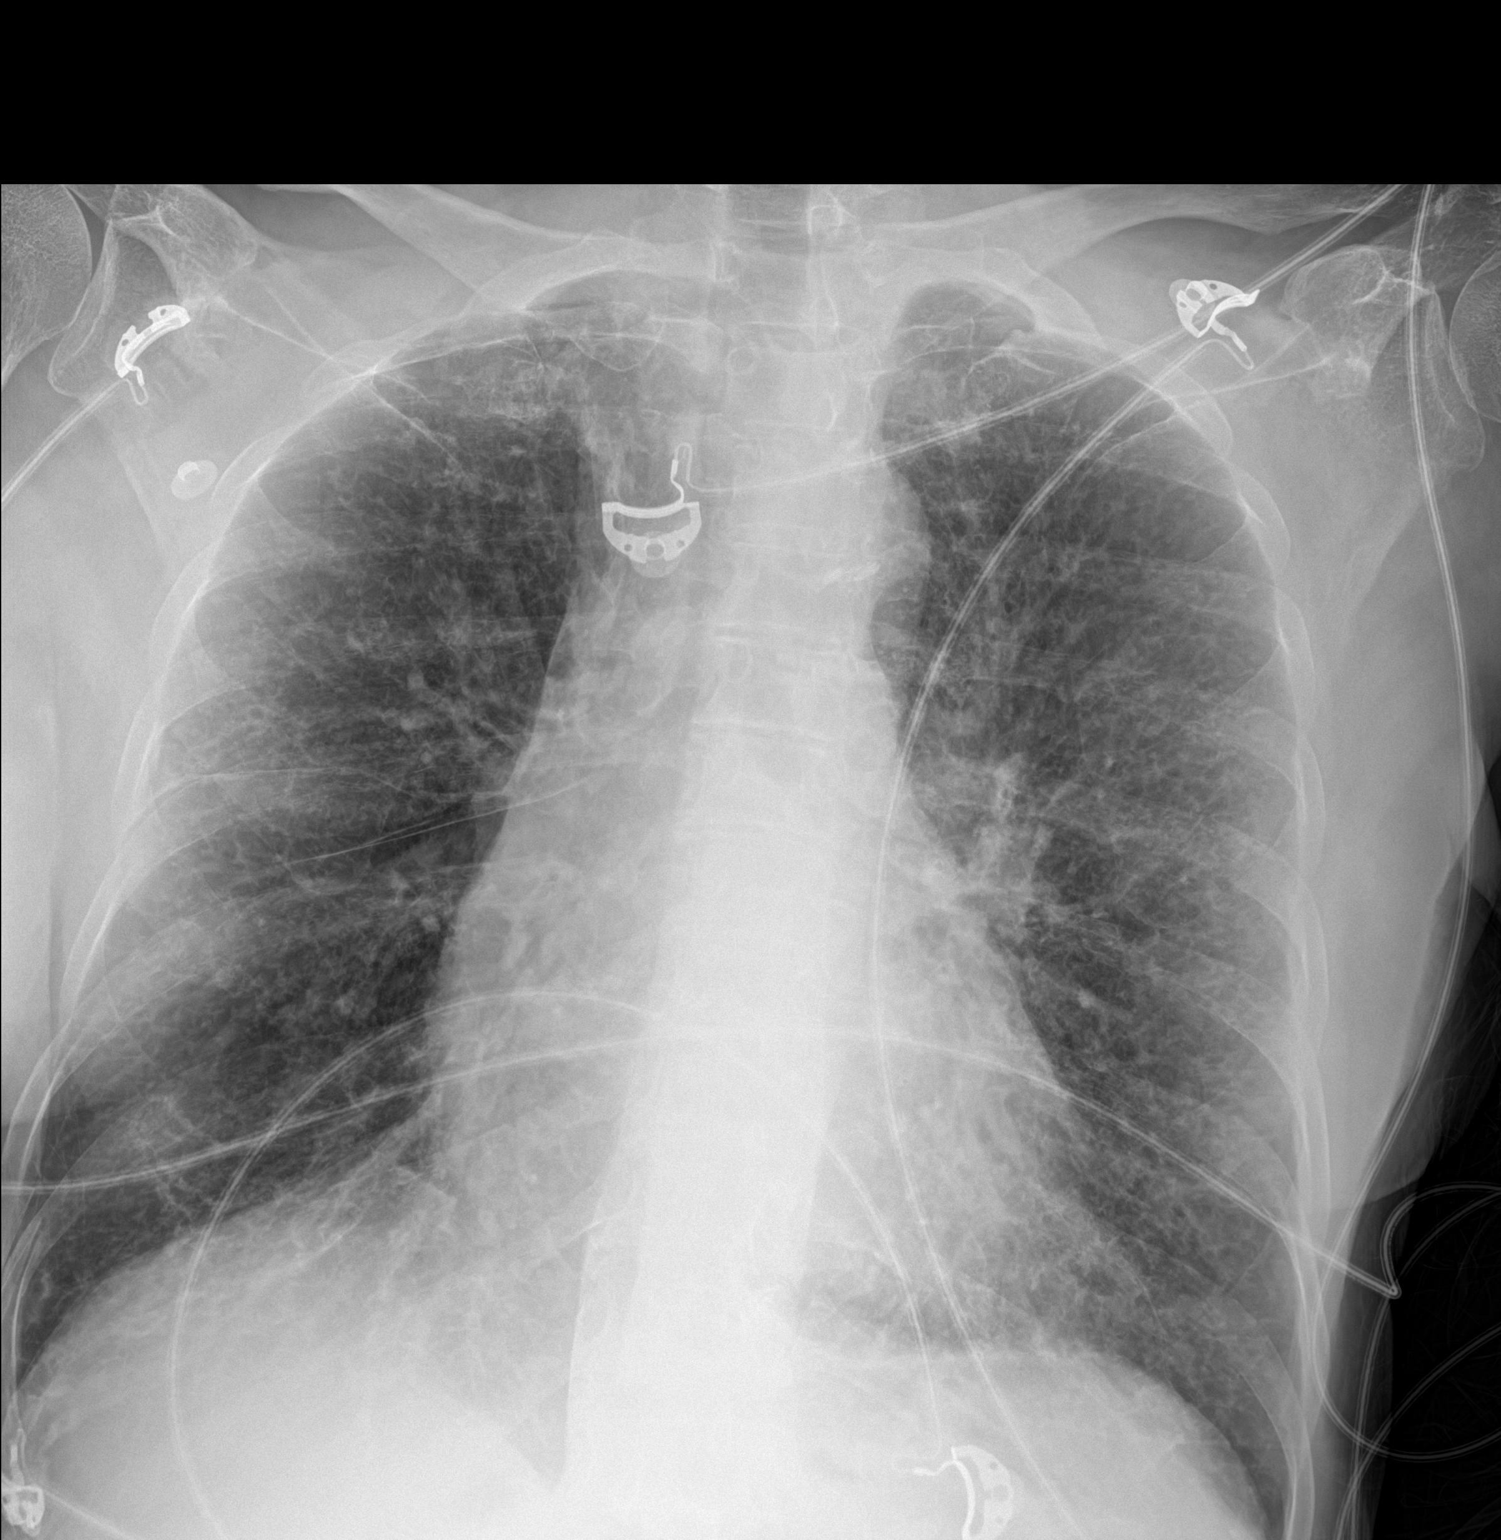

[1 of 1 positions shown; findings below may reference images not displayed]

FINDINGS: Stable cardiac silhouette within normal limits given projection
technique. Aortic calcific atherosclerosis. Coarse interstitial
opacities are present diffusely throughout the lungs. No
consolidation, effusion, or pneumothorax. No acute osseous
abnormality is evident.
IMPRESSION: Diffuse coarse interstitial opacities, suspected background of
interstitial lung disease. Acute atypical pneumonia or bronchitis
may be present. No focal consolidation.

## 2020-06-10 ENCOUNTER — Ambulatory Visit (HOSPITAL_COMMUNITY)
Admission: RE | Admit: 2020-06-10 | Discharge: 2020-06-10 | Disposition: A | Discharge status: Home / Self Care | Payer: Medicaid Other | Source: Ambulatory Visit | Attending: Cardiology | Admitting: Cardiology

## 2020-06-10 ENCOUNTER — Other Ambulatory Visit: Payer: Self-pay

## 2020-06-10 ENCOUNTER — Other Ambulatory Visit (HOSPITAL_COMMUNITY): Payer: Self-pay | Admitting: Physician Assistant

## 2020-06-10 DIAGNOSIS — I6522 Occlusion and stenosis of left carotid artery: Secondary | ICD-10-CM

## 2020-06-21 ENCOUNTER — Ambulatory Visit: Payer: Medicaid Other | Admitting: Critical Care Medicine

## 2020-06-24 MED FILL — CARTIA XT 300 MG CAPSULE SA: 300 | 30 days supply | Qty: 30 | Fill #1

## 2020-06-24 MED FILL — ROSUVASTATIN CALCIUM 10 MG: 10 | 30 days supply | Qty: 30 | Fill #1

## 2020-06-30 ENCOUNTER — Other Ambulatory Visit: Payer: Self-pay

## 2020-06-30 ENCOUNTER — Encounter: Payer: Self-pay | Admitting: Family Medicine

## 2020-06-30 ENCOUNTER — Ambulatory Visit: Payer: Self-pay | Attending: Family Medicine | Admitting: Family Medicine

## 2020-06-30 VITALS — BP 144/80 | HR 78 | Wt 137.8 lb

## 2020-06-30 DIAGNOSIS — G8929 Other chronic pain: Secondary | ICD-10-CM

## 2020-06-30 DIAGNOSIS — J449 Chronic obstructive pulmonary disease, unspecified: Secondary | ICD-10-CM

## 2020-06-30 DIAGNOSIS — B070 Plantar wart: Secondary | ICD-10-CM | POA: Insufficient documentation

## 2020-06-30 DIAGNOSIS — I4891 Unspecified atrial fibrillation: Secondary | ICD-10-CM

## 2020-06-30 DIAGNOSIS — F17200 Nicotine dependence, unspecified, uncomplicated: Secondary | ICD-10-CM

## 2020-06-30 DIAGNOSIS — H539 Unspecified visual disturbance: Secondary | ICD-10-CM

## 2020-06-30 DIAGNOSIS — J4489 Other specified chronic obstructive pulmonary disease: Secondary | ICD-10-CM

## 2020-06-30 DIAGNOSIS — G479 Sleep disorder, unspecified: Secondary | ICD-10-CM

## 2020-06-30 DIAGNOSIS — G453 Amaurosis fugax: Secondary | ICD-10-CM

## 2020-06-30 DIAGNOSIS — M545 Low back pain, unspecified: Secondary | ICD-10-CM

## 2020-06-30 DIAGNOSIS — F419 Anxiety disorder, unspecified: Secondary | ICD-10-CM

## 2020-06-30 DIAGNOSIS — F17210 Nicotine dependence, cigarettes, uncomplicated: Secondary | ICD-10-CM | POA: Insufficient documentation

## 2020-06-30 DIAGNOSIS — R296 Repeated falls: Secondary | ICD-10-CM

## 2020-06-30 DIAGNOSIS — M79671 Pain in right foot: Secondary | ICD-10-CM

## 2020-06-30 DIAGNOSIS — I6523 Occlusion and stenosis of bilateral carotid arteries: Secondary | ICD-10-CM

## 2020-06-30 DIAGNOSIS — Z79899 Other long term (current) drug therapy: Secondary | ICD-10-CM | POA: Insufficient documentation

## 2020-06-30 MED ORDER — MIRTAZAPINE 15 MG PO TBDP: 15.0000 mg | ORAL_TABLET | Freq: Every day | ORAL | 3 refills | Status: DC

## 2020-06-30 MED FILL — MIRTAZAPINE 15 MG ODT: 15 | 30 days supply | Qty: 30 | Fill #0

## 2020-06-30 NOTE — Progress Notes (Signed)
Still having problems with right heel. States that she has fallen 4 times since she last had appt Not sleeping at night Pain  Having memory issues

## 2020-06-30 NOTE — Patient Instructions (Signed)
Amaurosis Fugax Amaurosis fugax, also called transient visual loss, is a condition in which a person loses sight in one eye or, rarely, both eyes for a short time. The vision loss in the affected eye may be total or partial. The vision loss usually lasts for only a few seconds or minutes before sight returns to normal. In some cases, vision loss may last for several hours. This condition is typically caused by interruption of blood flow in the artery that supplies blood to the retina. The retina is the part of the eye that contains the nerves needed for sight. This condition can be a warning sign that a stroke may happen, either in the eye or in the brain. A stroke can result in permanent vision loss or loss of other body functions. What are the causes? This condition is caused by a loss or interruption of blood flow to the retinal artery. Causes for the change in blood flow include:  A buildup of cholesterol and fats, or plaque, in the artery (atherosclerosis). If any plaque breaks off and gets into the bloodstream, it can travel to other blood vessels, such as the retinal artery.  Diseases of the heart valves.  Certain blood conditions, such as sickle cell anemia, leukemia, and blood clotting (coagulation) disorders.  Inflammation of the arteries (vasculitis).  A fast or irregular heartbeat, such as atrial fibrillation.  Family history of stroke. What increases the risk? The following factors may make you more likely to develop this condition:  Use of any tobacco products, including cigarettes, chewing tobacco, or electronic cigarettes.  Poorly controlled diabetes.  Conditions that can lead to diseases of the heart and blood vessels (cardiovascular diseases), such as: ? High blood pressure (hypertension). ? High cholesterol.  Drinking too much alcohol regularly.  Use of drugs, especially cocaine.  Age. The risk increases with age. What are the signs or symptoms? The main symptom  of this condition is painless, sudden loss of vision in one eye. The vision loss often starts at the top and moves down, as if a curtain is being pulled down over your eye. This is usually followed by a quick return of vision. However, symptoms may last for several hours. It is important to seek medical care right away even if your symptoms go away. How is this diagnosed? This condition is diagnosed by:  Medical history and physical exam.  Eye exam, including dilating drops and looking at the back of your eyes.  Carotid ultrasound. This checks for plaque in the carotid arteries in your neck.  Magnetic resonance angiography (MRA). This checks the carotid artery and the branches that supply the brain. MRA looks for areas of blockage or disease. You may also have other tests, including:  Blood tests.  Electrocardiogram (ECG) to check your heart rhythm.  Echocardiogram (ECHO) to check your heart function. How is this treated? Emergency treatment for this condition may involve massaging the eyeball or using certain breathing techniques to remove or ease the blockage of the retinal artery. You may also get an emergency referral to a clinic or medical center that treats strokes. Other treatments focus on reducing your risk of having a stroke in the future. These may include:  Medicines, such as medicines to manage high blood pressure, diabetes, or cholesterol, or to thin the blood.  Surgical procedures, such as: ? Carotid endarterectomy to remove plaque from the carotid artery. ? Carotid angioplasty and placement of a small, mesh tube (stenting) to open the blocked part of  the artery.  Lifestyle changes, such as stopping tobacco use, changing your diet, and getting enough exercise. Follow these instructions at home: Medicines  Take over-the-counter and prescription medicines only as told by your health care provider.  If you are taking blood thinners: ? Talk with your health care provider  before you take any medicines that contain aspirin or NSAIDs, such as ibuprofen. These medicines increase your risk for dangerous bleeding. ? Take your medicine exactly as told, at the same time every day. ? Avoid activities that could cause injury or bruising, and follow instructions about how to prevent falls. ? Wear a medical alert bracelet or carry a card that lists what medicines you take. Eating and drinking   Eat a diet that includes five or more servings of fruits and vegetables each day. This may reduce the risk of stroke.  Certain diets may help with high blood pressure, high cholesterol, diabetes, or obesity. These include: ? A diet that is low in salt (sodium) to manage high blood pressure. ? A high-fiber diet that is low in saturated fat, trans fat, and cholesterol to control cholesterol levels. ? A low-carbohydrate, low-sugar diet to manage diabetes. ? A reduced-calorie diet that is low in sodium, saturated fat, trans fat, and cholesterol to manage obesity. Lifestyle   Maintain a healthy weight.  Stay physically active. Try to get at least 30 minutes of activity on most or all days.  Do not use any products that contain nicotine or tobacco, such as cigarettes, e-cigarettes, and chewing tobacco. If you need help quitting, ask your health care provider.  Do not misuse drugs. General instructions  Do not drink alcohol if: ? Your health care provider tells you not to drink. ? You are pregnant, may be pregnant, or are planning to become pregnant.  If you drink alcohol: ? Limit how much you use to:  0-1 drink a day for women.  0-2 drinks a day for men. ? Be aware of how much alcohol is in your drink. In the [place] one drink equals one 12 oz bottle of beer (355 mL), one 5 oz glass of wine (148 mL), or one 1 oz glass of hard liquor (44 mL).  Keep all follow-up visits as told by your health care provider. This is important. Contact a health care provider if:  You lose  vision in one eye or both eyes. Get help right away if:   You have chest pain or an irregular heartbeat.  You have any symptoms of a stroke. "BE FAST" is an easy way to remember the main warning signs of a stroke. ? B - Balance. Signs are dizziness, sudden trouble walking, or loss of balance. ? E - Eyes. Signs are trouble seeing or a sudden change in vision. ? F - Face. Signs are sudden weakness or numbness of the face, or the face or eyelid drooping on one side. ? A - Arms. Signs are weakness or numbness in an arm. This happens suddenly and usually on one side of the body. ? S - Speech. Signs are sudden trouble speaking, slurred speech, or trouble understanding what people say. ? T - Time. Time to call emergency services. Write down what time symptoms started.  You have other signs of a stroke, such as: ? A sudden, severe headache with no known cause. ? Nausea or vomiting. ? Seizure. These symptoms may represent a serious problem that is an emergency. Do not wait to see if the symptoms will go away.  Get medical help right away. Call your local emergency services (911 in the [place] Do not drive yourself to the hospital. Summary  Amaurosis fugax is a condition in which you lose sight for a short time.  This condition can be a warning sign that a stroke may happen. A stroke can result in permanent vision loss or loss of other body functions.  Seek medical care right away even if your symptoms go away. This information is not intended to replace advice given to you by your health care provider. Make sure you discuss any questions you have with your health care provider. Document Revised: 02/18/2019 Document Reviewed: 02/18/2019 Elsevier Patient Education  2020 [employer].  Warning Signs of a Stroke  A stroke is a medical emergency and should be treated right away--every second counts. A stroke is caused by a decrease or block in blood flow to the brain. When this occurs, certain areas  of the brain do not get enough oxygen, and brain cells begin to die. A stroke can lead to brain damage and can sometimes be life-threatening. However, if someone having a stroke gets medical treatment right away, he or she has better chances of surviving and recovering from the stroke. Being able to recognize the symptoms of a stroke is very important. Types of strokes There are two main types of strokes:  Ischemic strokes. This is the most common type of stroke. These strokes happen when a blood vessel that supplies blood to the brain is being blocked.  Hemorrhagic strokes. These strokes result from bleeding in the brain due to a blood vessel leaking or bursting (rupturing). A transient ischemic attack (TIA) is a "warning stroke" that causes stroke-like symptoms that go away quickly. Unlike a stroke, a TIA does not cause permanent damage to the brain. However, the symptoms of a TIA are the same as a stroke, and they also require medical treatment right away. Having a TIA is a sign that you are at higher risk for a permanent stroke. Warning signs of a stroke The symptoms of stroke may vary and will reflect the part of the brain that is involved. Symptoms usually happen suddenly. "BE FAST" is an easy way to remember the main warning signs of a stroke. B - Balance Signs are dizziness, sudden trouble walking, or loss of balance. E - Eyes Signs are trouble seeing or a sudden change in vision. F - Face Signs are sudden weakness or numbness of the face, or the face or eyelid drooping on one side. A - Arms Signs are weakness or numbness in an arm. This happens suddenly and usually on one side of the body. S - Speech Signs are sudden trouble speaking, slurred speech, or trouble understanding what people say. T - Time Time to call emergency services. Write down what time symptoms started. Other signs of a stroke Some less common signs of a stroke include:  A sudden, severe headache with no known  cause.  Nausea or vomiting.  Seizure. A stroke may be happening even if only one "BE FAST" symptoms is present. These symptoms may represent a serious problem that is an emergency. Do not wait to see if the symptoms will go away. Get medical help right away. Call your local emergency services (911 in the [place] Do not drive yourself to the hospital. Summary  A stroke is a medical emergency and should be treated right away--every second counts.  "BE FAST" is an easy way to remember the main warning signs  of a stroke.  Call local emergency services right away if you or someone else has any stroke symptoms, even if the symptoms go away.  Make note of what time the first symptoms appeared. Emergency responders or emergency room staff will need to know this information.  Do not wait to see if symptoms will go away. Call 911 even if only one of the "BE FAST" symptoms appears. This information is not intended to replace advice given to you by your health care provider. Make sure you discuss any questions you have with your health care provider. Document Revised: 07/19/2017 Document Reviewed: 11/23/2016 Elsevier Patient Education  2020 [employer].

## 2020-06-30 NOTE — Progress Notes (Signed)
Established Patient Office Visit  Subjective:  Patient ID: Carrie Case, female    DOB: 1956/01/07  Age: 64 y.o. MRN: [id number]  CC:  Chief Complaint  Patient presents with  . Follow-up    HPI Carrie Case, 64 year old female who was last seen in the office on 05/11/2020 by another provider due to complaints of difficulty sleeping, issues with her right heel, frequent falls and needing medication refills in addition to her chronic issues of atrial fibrillation, COPD, bilateral carotid artery stenosis and tobacco dependency.  Patient was referred to physical therapy to help with her falls as well as podiatry relating to her heel pain.  Patient was also found to have anxiety and depression for which she was started on duloxetine and was asked to return to clinic in 1 month for reassessment.  Patient is accompanied by her daughter at today's visit who states that patient has not been notified regarding follow-up appointments with podiatry or physical therapy and that patient stopped duloxetine as it made her feel funny.         She reports that she will be walking and feels that she is going in a straight line but actually drifts off to 1 side or the other.  She reports that she walks like she is "drunk". Symptoms have been occurring for more than a year per patient and daughter.  Patient reports that she will just be walking and suddenly fall.  She also reports that she has episodes in which she will be looking at someone or something and then feels as if there is a darkening of her vision, usually in the left eye.  She also reports that she repeatedly sees floating black spots in her visual field.  She denies any episodes of focal numbness or weakness.  She does report that she will sometimes be sleeping and suddenly be awakened with sensation of pain similar to when a part of your body has fallen asleep and then reawakened.  This is happened in her arms and also often occurs in her left leg.   She also has some issues with chronic left-sided low back pain. She reports that over-the-counter pain medications have not been helpful.  Transfer text She reports that she is afraid to go anywhere and is afraid to even shower if no one else is around due to her recurrent falls.           She has had recent telemedicine visit with cardiology in follow-up of her atrial fibrillation.  She does have occasional sensation of palpitations/increased heart rate.  She denies any chest pain.  She continues to have chronic issues with shortness of breath due to her COPD.  She does continue to smoke.  She also has complaint of a painful area on her right heel which now hurts with or without walking.  She reports that she occasionally tries to shave down the area but it always recurs.  She also reports difficulty sleeping and states that over-the-counter melatonin has not helped and she would like a prescription for medication to help with sleep.  Past Medical History:  Diagnosis Date  . Acute respiratory failure with hypoxia (HCC) 12/22/2018  . Tobacco dependence     Past Surgical History:  Procedure Laterality Date  . TUBAL LIGATION      Family History  Problem Relation Age of Onset  . Hypertension Mother   . Hyperlipidemia Mother   . Cancer Mother   . COPD Mother   .  Anxiety disorder Mother     Social History   Socioeconomic History  . Marital status: Widowed    Spouse name: Not on file  . Number of children: Not on file  . Years of education: Not on file  . Highest education level: Not on file  Occupational History  . Occupation: unemployed  Tobacco Use  . Smoking status: Current Every Day Smoker    Packs/day: 0.50    Years: 47.00    Pack years: 23.50    Types: Cigarettes  . Smokeless tobacco: Never Used  Vaping Use  . Vaping Use: Never used  Substance and Sexual Activity  . Alcohol use: No  . Drug use: No  . Sexual activity: Not Currently  Other Topics Concern  . Not on file    Social History Narrative  . Not on file   Social Determinants of Health   Financial Resource Strain:   . Difficulty of Paying Living Expenses: Not on file  Food Insecurity:   . Worried About Programme researcher, broadcasting/film/video in the Last Year: Not on file  . Ran Out of Food in the Last Year: Not on file  Transportation Needs:   . Lack of Transportation (Medical): Not on file  . Lack of Transportation (Non-Medical): Not on file  Physical Activity:   . Days of Exercise per Week: Not on file  . Minutes of Exercise per Session: Not on file  Stress:   . Feeling of Stress : Not on file  Social Connections:   . Frequency of Communication with Friends and Family: Not on file  . Frequency of Social Gatherings with Friends and Family: Not on file  . Attends Religious Services: Not on file  . Active Member of Clubs or Organizations: Not on file  . Attends Banker Meetings: Not on file  . Marital Status: Not on file  Intimate Partner Violence:   . Fear of Current or Ex-Partner: Not on file  . Emotionally Abused: Not on file  . Physically Abused: Not on file  . Sexually Abused: Not on file    Outpatient Medications Prior to Visit  Medication Sig Dispense Refill  . albuterol (PROVENTIL) (2.5 MG/3ML) 0.083% nebulizer solution Take 3 mLs (2.5 mg total) by nebulization every 6 (six) hours as needed for wheezing or shortness of breath. 75 mL 2  . albuterol (VENTOLIN HFA) 108 (90 Base) MCG/ACT inhaler Inhale 1-2 puffs into the lungs every 6 (six) hours as needed for wheezing or shortness of breath. 18 g 0  . aspirin EC 81 MG tablet Take 1 tablet (81 mg total) by mouth daily. 90 tablet 3  . diltiazem (CARTIA XT) 300 MG 24 hr capsule TAKE 1 CAPSULE (300 MG TOTAL) BY MOUTH DAILY. 90 capsule 3  . rosuvastatin (CRESTOR) 10 MG tablet Take 1 tablet (10 mg total) by mouth daily. 90 tablet 3  . tiotropium (SPIRIVA HANDIHALER) 18 MCG inhalation capsule PLACE 1 CAPSULE (18 MCG TOTAL) INTO INHALER AND INHALE  DAILY. (Patient taking differently: as needed. PLACE 1 CAPSULE (18 MCG TOTAL) INTO INHALER AND INHALE DAILY.) 30 capsule 6  . DULoxetine (CYMBALTA) 60 MG capsule Take 1 capsule (60 mg total) by mouth daily. (Patient not taking: Reported on 06/30/2020) 30 capsule 3   No facility-administered medications prior to visit.    Allergies  Allergen Reactions  . Ibuprofen Hives    ROS Review of Systems  Constitutional: Positive for fatigue. Negative for chills and fever.  HENT: Negative for sore  throat and trouble swallowing.   Eyes: Positive for visual disturbance. Negative for photophobia.  Respiratory: Positive for cough (Has chronic issues with occasional coughing due to smoking but denies any recent increase in cough) and shortness of breath.   Cardiovascular: Positive for palpitations. Negative for chest pain.  Gastrointestinal: Negative for abdominal pain, constipation, diarrhea and nausea.  Endocrine: Negative for polydipsia, polyphagia and polyuria.  Genitourinary: Negative for dysuria and frequency.  Musculoskeletal: Positive for back pain and gait problem.  Skin: Negative for rash and wound.  Neurological: Positive for light-headedness. Negative for speech difficulty.       Loss of balance  Hematological: Negative for adenopathy. Does not bruise/bleed easily.  Psychiatric/Behavioral: Positive for sleep disturbance. The patient is nervous/anxious.       Objective:    Physical Exam Vitals and nursing note reviewed.  Constitutional:      Appearance: Normal appearance.  Neck:     Vascular: No carotid bruit.  Cardiovascular:     Rate and Rhythm: Normal rate. Rhythm irregular.  Pulmonary:     Effort: Pulmonary effort is normal.     Breath sounds: No wheezing.     Comments: Scattered mild coarse breath sounds Abdominal:     Palpations: Abdomen is soft.     Tenderness: There is no abdominal tenderness. There is no right CVA tenderness, left CVA tenderness, guarding or rebound.   Musculoskeletal:        General: Tenderness (Mid thoracic and lumbosacral discomfort to palpation) present.     Cervical back: Normal range of motion and neck supple. No tenderness.     Right lower leg: No edema.     Left lower leg: No edema.  Lymphadenopathy:     Cervical: No cervical adenopathy.  Skin:    General: Skin is warm and dry.     Findings: Lesion (Large callused area on the right heel which appears to be a plantar wart) present.  Neurological:     General: No focal deficit present.     Mental Status: She is alert and oriented to person, place, and time.  Psychiatric:        Behavior: Behavior normal.     Comments: Patient appears slightly anxious     BP (!) 144/80 (BP Location: Right Arm, Patient Position: Sitting)   Pulse 78   Wt 137 lb 12.8 oz (62.5 kg)   SpO2 98%   BMI 22.24 kg/m  Wt Readings from Last 3 Encounters:  06/30/20 137 lb 12.8 oz (62.5 kg)  05/24/20 140 lb (63.5 kg)  05/11/20 139 lb 12.8 oz (63.4 kg)     Health Maintenance Due  Topic Date Due  . Hepatitis C Screening  Never done  . COVID-19 Vaccine (1) Never done  . TETANUS/TDAP  Never done  . PAP SMEAR-Modifier  Never done  . MAMMOGRAM  Never done  . COLONOSCOPY  Never done     Lab Results  Component Value Date   TSH 1.246 12/22/2018   Lab Results  Component Value Date   WBC 7.5 05/11/2020   HGB 14.4 05/11/2020   HCT 41.9 05/11/2020   MCV 93 05/11/2020   PLT 304 05/11/2020   Lab Results  Component Value Date   NA 135 05/11/2020   K 4.2 05/11/2020   CO2 27 05/11/2020   GLUCOSE 88 05/11/2020   BUN 9 05/11/2020   CREATININE 0.68 05/11/2020   BILITOT 0.3 01/13/2019   ALKPHOS 110 01/13/2019   AST 11 01/13/2019   ALT  11 01/13/2019   PROT 7.0 01/13/2019   ALBUMIN 4.0 01/13/2019   CALCIUM 9.5 05/11/2020   ANIONGAP 10 12/24/2018   Lab Results  Component Value Date   CHOL 125 12/23/2018   Lab Results  Component Value Date   HDL 26 (L) 12/23/2018   Lab Results    Component Value Date   LDLCALC 61 12/23/2018   Lab Results  Component Value Date   TRIG 190 (H) 12/23/2018   Lab Results  Component Value Date   CHOLHDL 4.8 12/23/2018   Lab Results  Component Value Date   HGBA1C 5.6 12/22/2018      Assessment & Plan:  1. Recurrent falls Patient reports that she has issues with being off balance and continues to have recurrent falls.  Patient has been referred to neurology for further evaluation and treatment as she has also been asked to have an MRI of the brain which will be scheduled.  Patient reports that she is very claustrophobic and MRI will need to be open. - Ambulatory referral to Neurology - MR Brain Wo Contrast; Future  2. Amaurosis fugax of left eye She reports episodes consistent with amaurosis fugax in which she feels as if a dark shade comes down over her visual field and this tends to occur on the left.  She is being referred to neurology for further evaluation as well as referral to ophthalmology. - Ambulatory referral to Neurology - Ambulatory referral to Ophthalmology  3. Visual disturbance Patient with complaint of 2 different types of visual disturbances, 1 in which she will have darkening of her visual field as if someone has pulled a shade down but also episodes of dark spots in the visual field.  Patient is being referred to both neurology and ophthalmology for further evaluation. - Ambulatory referral to Ophthalmology  4. Heel pain, chronic, right Patient with complaint of chronic right heel pain and she appears to have a large plantar wart on the right heel.  She was referred to podiatry at her last visit but she and her daughter reports that they have not been contacted regarding an appointment therefore new podiatry referral will be placed. - Ambulatory referral to Podiatry  5. Bilateral carotid artery stenosis Patient has had carotid Dopplers done 06/10/2020 showing left carotid stenosis of 40 to 59% and right  internal carotid stenosis of 1 to 39%.  Patient has had some symptoms such as visual field changes that are concerning for stroke.  Educational material on warning signs of strokes and amaurosis fugax provided as part of after visit summary.  Continue rosuvastatin and daily aspirin.  Patient is being referred to neurology for further evaluation and treatment - Ambulatory referral to Neurology - MR Brain Wo Contrast; Future  6. Atrial fibrillation with RVR [facility]) Managed by cardiology and recent cardiology note reviewed.  Patient's rate is controlled at today's visit.  7. COPD with chronic bronchitis (HCC) 8. Tobacco dependence Discussed the importance of complete smoking cessation.  Patient is on Spiriva and is encouraged to take the medication on a daily basis and continue as needed use of albuterol.  9. Chronic left-sided low back pain, unspecified whether sciatica present Patient has been asked to obtain x-ray of the lumbar spine to look for evidence of arthritis or degenerative disc disease that may be causing her left-sided low back pain with radiation. - DG Lumbar Spine Complete; Future  10. Sleeping difficult; 11. Anxiety Patient reports difficulty sleeping as well as issues with anxiety.  Prescription provided  for Remeron 15 mg to take at bedtime which should help with both sleep and anxiety. - mirtazapine (REMERON SOL-TAB) 15 MG disintegrating tablet; Take 1 tablet (15 mg total) by mouth at bedtime.  Dispense: 30 tablet; Refill: 3     Follow-up: Return in about 6 weeks (around 08/11/2020).     Cain Saupe, MD

## 2020-07-07 ENCOUNTER — Encounter: Payer: Self-pay | Admitting: Neurology

## 2020-07-12 ENCOUNTER — Ambulatory Visit: Payer: Medicaid Other | Admitting: Critical Care Medicine

## 2020-07-25 ENCOUNTER — Telehealth: Payer: Self-pay

## 2020-07-25 MED FILL — ROSUVASTATIN CALCIUM 10 MG: 10 | 30 days supply | Qty: 30 | Fill #0

## 2020-07-25 MED FILL — CARTIA XT 300 MG CAPSULE SA: 300 | 30 days supply | Qty: 30 | Fill #2

## 2020-07-25 MED FILL — MIRTAZAPINE 15 MG ODT: 15 | 30 days supply | Qty: 30 | Fill #1

## 2020-07-25 NOTE — Progress Notes (Deleted)
NEUROLOGY CONSULTATION NOTE  Carrie Case MRN: [id number] DOB: [birth year]  Referring provider: Cain Saupe, MD Primary care provider: Cain Saupe, MD  Reason for consult:  Amaurosis fugax, frequent falls   Subjective:  Carrie Case is a 64 year old ***-handed female with atrial fibrillation on AC, COPD, bilateral carotid artery stenosis and tobacco use disorder who presents for ***-handed amaurosis fugax and frequent falls.  History supplemented by referring provider's note.     ***  ***  Carotid duplex on 06/10/2020 showed 40-59% stenosis in the left ICA, 1-39% stenosis in the right ICA, bidirectional flow of left vertebral artery, and antegrade flow of right vertebral artery. Echocardiogram on 12/23/2018 showed EF 60-65% with mild LVH, mild LAE, mild TR and mild pulmonary hypertension. Lipid panel from 12/23/2018 with chol 125, TG 190, HDL 26 and LDL 61  PAST MEDICAL HISTORY: Past Medical History:  Diagnosis Date  . Acute respiratory failure with hypoxia (HCC) 12/22/2018  . Tobacco dependence     PAST SURGICAL HISTORY: Past Surgical History:  Procedure Laterality Date  . TUBAL LIGATION      MEDICATIONS: Current Outpatient Medications on File Prior to Visit  Medication Sig Dispense Refill  . albuterol (PROVENTIL) (2.5 MG/3ML) 0.083% nebulizer solution Take 3 mLs (2.5 mg total) by nebulization every 6 (six) hours as needed for wheezing or shortness of breath. 75 mL 2  . albuterol (VENTOLIN HFA) 108 (90 Base) MCG/ACT inhaler Inhale 1-2 puffs into the lungs every 6 (six) hours as needed for wheezing or shortness of breath. 18 g 0  . aspirin EC 81 MG tablet Take 1 tablet (81 mg total) by mouth daily. 90 tablet 3  . diltiazem (CARTIA XT) 300 MG 24 hr capsule TAKE 1 CAPSULE (300 MG TOTAL) BY MOUTH DAILY. 90 capsule 3  . mirtazapine (REMERON SOL-TAB) 15 MG disintegrating tablet Take 1 tablet (15 mg total) by mouth at bedtime. 30 tablet 3  . rosuvastatin (CRESTOR) 10 MG  tablet Take 1 tablet (10 mg total) by mouth daily. 90 tablet 3  . tiotropium (SPIRIVA HANDIHALER) 18 MCG inhalation capsule PLACE 1 CAPSULE (18 MCG TOTAL) INTO INHALER AND INHALE DAILY. (Patient taking differently: as needed. PLACE 1 CAPSULE (18 MCG TOTAL) INTO INHALER AND INHALE DAILY.) 30 capsule 6   No current facility-administered medications on file prior to visit.    ALLERGIES: Allergies  Allergen Reactions  . Ibuprofen Hives    FAMILY HISTORY: Family History  Problem Relation Age of Onset  . Hypertension Mother   . Hyperlipidemia Mother   . Cancer Mother   . COPD Mother   . Anxiety disorder Mother    ***.  SOCIAL HISTORY: Social History   Socioeconomic History  . Marital status: Widowed    Spouse name: Not on file  . Number of children: Not on file  . Years of education: Not on file  . Highest education level: Not on file  Occupational History  . Occupation: unemployed  Tobacco Use  . Smoking status: Current Every Day Smoker    Packs/day: 0.50    Years: 47.00    Pack years: 23.50    Types: Cigarettes  . Smokeless tobacco: Never Used  Vaping Use  . Vaping Use: Never used  Substance and Sexual Activity  . Alcohol use: No  . Drug use: No  . Sexual activity: Not Currently  Other Topics Concern  . Not on file  Social History Narrative  . Not on file   Social Determinants of  Health   Financial Resource Strain:   . Difficulty of Paying Living Expenses: Not on file  Food Insecurity:   . Worried About Programme researcher, broadcasting/film/video in the Last Year: Not on file  . Ran Out of Food in the Last Year: Not on file  Transportation Needs:   . Lack of Transportation (Medical): Not on file  . Lack of Transportation (Non-Medical): Not on file  Physical Activity:   . Days of Exercise per Week: Not on file  . Minutes of Exercise per Session: Not on file  Stress:   . Feeling of Stress : Not on file  Social Connections:   . Frequency of Communication with Friends and Family:  Not on file  . Frequency of Social Gatherings with Friends and Family: Not on file  . Attends Religious Services: Not on file  . Active Member of Clubs or Organizations: Not on file  . Attends Banker Meetings: Not on file  . Marital Status: Not on file  Intimate Partner Violence:   . Fear of Current or Ex-Partner: Not on file  . Emotionally Abused: Not on file  . Physically Abused: Not on file  . Sexually Abused: Not on file    Objective:  *** General: No acute distress.  Patient appears well-groomed.   Head:  Normocephalic/atraumatic Eyes:  fundi examined but not visualized Neck: supple, no paraspinal tenderness, full range of motion Back: No paraspinal tenderness Heart: regular rate and rhythm Lungs: Clear to auscultation bilaterally. Vascular: No carotid bruits. Neurological Exam: Mental status: alert and oriented to person, place, and time, recent and remote memory intact, fund of knowledge intact, attention and concentration intact, speech fluent and not dysarthric, language intact. Cranial nerves: CN I: not tested CN II: pupils equal, round and reactive to light, visual fields intact CN III, IV, VI:  full range of motion, no nystagmus, no ptosis CN V: facial sensation intact. CN VII: upper and lower face symmetric CN VIII: hearing intact CN IX, X: gag intact, uvula midline CN XI: sternocleidomastoid and trapezius muscles intact CN XII: tongue midline Bulk & Tone: normal, no fasciculations. Motor:  muscle strength 5/5 throughout Sensation:  Pinprick, temperature and vibratory sensation intact. Deep Tendon Reflexes:  2+ throughout,  toes downgoing.   Finger to nose testing:  Without dysmetria.   Heel to shin:  Without dysmetria.   Gait:  Normal station and stride.  Romberg negative.  Assessment/Plan:   ***    Thank you for allowing me to take part in the care of this patient.  Shon Millet, DO  CC: ***

## 2020-07-25 NOTE — Telephone Encounter (Signed)
Called pt and left vm. Alert pt appt had been canceled on 12/23 with Dr. Jillyn Hidden and need to call back to reschedule with different provider since Dr. Jillyn Hidden is no longer here.

## 2020-07-26 ENCOUNTER — Ambulatory Visit: Payer: Medicaid Other | Admitting: Neurology

## 2020-08-11 ENCOUNTER — Ambulatory Visit: Payer: Medicaid Other | Admitting: Family Medicine

## 2020-08-23 MED FILL — ROSUVASTATIN CALCIUM 10 MG: 10 | 30 days supply | Qty: 30 | Fill #1

## 2020-08-23 MED FILL — CARTIA XT 300 MG CAPSULE SA: 300 | 30 days supply | Qty: 30 | Fill #0

## 2020-08-23 MED FILL — MIRTAZAPINE 15 MG ODT: 15 | 30 days supply | Qty: 30 | Fill #2

## 2020-09-22 ENCOUNTER — Ambulatory Visit: Payer: Medicaid Other | Admitting: Internal Medicine

## 2020-09-23 MED FILL — MIRTAZAPINE 15 MG ODT: 15 | 30 days supply | Qty: 30 | Fill #3

## 2020-09-23 MED FILL — ROSUVASTATIN CALCIUM 10 MG: 10 | 30 days supply | Qty: 30 | Fill #2

## 2020-09-23 MED FILL — CARTIA XT 300 MG CAPSULE SA: 300 | 30 days supply | Qty: 30 | Fill #1

## 2020-09-25 NOTE — Progress Notes (Unsigned)
Subjective:    Patient ID: Carrie Case, female    DOB: 1956/03/04, 65 y.o.   MRN: [id number] Virtual Visit via Video Note  I connected with@ on 09/26/20 at@ by a video enabled telemedicine application and verified that I am speaking with the correct person using two identifiers.   Consent:  I discussed the limitations, risks, security and privacy concerns of performing an evaluation and management service by video visit and the availability of in person appointments. I also discussed with the patient that there may be a patient responsible charge related to this service. The patient expressed understanding and agreed to proceed.  Location of patient: Patient's at home  Location of provider: I am in the office  Persons participating in the televisit with the patient.   Patient's daughter is on the call with the patient    History of Present Illness: 65 y.o.    Former fulp pt Caf, copd  09/26/20 Last seen fulp 06/2020: This patient reports a history of frequent falls since November 2021.  The patient had been referred to neurology and an MRI of the brain was to been scheduled however this never occurred.  The patient had yet another fall earlier this morning hitting her head on the floor.  She states she has left arm weakness and the symptoms of progress since September 01, 2020.  She has inability to raise her left wrist.  She still has black spots in her eyes and feels like a kick curtain or shade is pulled over the left eye at times.  She denies any seizures.  She does have some headaches.  She also has increased productive cough of thick green mucus increased wheezing.  She still smoking half pack a day of cigarettes.  Patient notes poor dentition in the lower gums she does have dentures in the upper gums.  She was placed on Remeron in November for depression and to help with sleep she notes the falls of increased since being on the Remeron.  Prior history of atrial fibrillation in  2020 last EKG May 2020 showed chronic atrial fibrillation.  The patient had previously been requested to stay on aspirin from cardiology.  They called her a chads vascular 1 score low risk and did not need anticoagulation.  Note also with frequent falls anticoagulation would be contraindicated.  Notes the patient does not have a walker at this time.  Patient does have a pre-existing COPD. Patient also history of bilateral carotid artery stenosis with Dopplers done October 2021 showing 40 to 60% left carotid stenosis and 1 to 40% right internal carotid stenosis  Due to visual fields being abnormal she was to be sent to neurology but this consult never occurred.  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   Patient is yet to receive the COVID vaccine.  She needs a mammogram tetanus vaccine colon cancer screening HCV screening and Pap smear     Past Medical History:  Diagnosis Date  . Acute respiratory failure with hypoxia (HCC) 12/22/2018  . Tobacco dependence      Family History  Problem Relation Age of Onset  . Hypertension Mother   . Hyperlipidemia Mother   . Cancer Mother   . COPD Mother   . Anxiety disorder Mother      Social History   Socioeconomic History  . Marital status: Widowed    Spouse name: Not on file  . Number of children: Not on file  . Years  of education: Not on file  . Highest education level: Not on file  Occupational History  . Occupation: unemployed  Tobacco Use  . Smoking status: Current Every Day Smoker    Packs/day: 0.50    Years: 47.00    Pack years: 23.50    Types: Cigarettes  . Smokeless tobacco: Never Used  Vaping Use  . Vaping Use: Never used  Substance and Sexual Activity  . Alcohol use: No  . Drug use: No  . Sexual activity: Not Currently  Other Topics Concern  . Not on file  Social History Narrative  . Not on file   Social Determinants of Health   Financial Resource Strain: Not on file  Food Insecurity:  Not on file  Transportation Needs: Not on file  Physical Activity: Not on file  Stress: Not on file  Social Connections: Not on file  Intimate Partner Violence: Not on file     Allergies  Allergen Reactions  . Ibuprofen Hives     Outpatient Medications Prior to Visit  Medication Sig Dispense Refill  . aspirin EC 81 MG tablet Take 1 tablet (81 mg total) by mouth daily. 90 tablet 3  . albuterol (PROVENTIL) (2.5 MG/3ML) 0.083% nebulizer solution Take 3 mLs (2.5 mg total) by nebulization every 6 (six) hours as needed for wheezing or shortness of breath. 75 mL 2  . albuterol (VENTOLIN HFA) 108 (90 Base) MCG/ACT inhaler Inhale 1-2 puffs into the lungs every 6 (six) hours as needed for wheezing or shortness of breath. 18 g 0  . diltiazem (CARTIA XT) 300 MG 24 hr capsule TAKE 1 CAPSULE (300 MG TOTAL) BY MOUTH DAILY. 90 capsule 3  . mirtazapine (REMERON SOL-TAB) 15 MG disintegrating tablet Take 1 tablet (15 mg total) by mouth at bedtime. 30 tablet 3  . rosuvastatin (CRESTOR) 10 MG tablet Take 1 tablet (10 mg total) by mouth daily. 90 tablet 3  . tiotropium (SPIRIVA HANDIHALER) 18 MCG inhalation capsule PLACE 1 CAPSULE (18 MCG TOTAL) INTO INHALER AND INHALE DAILY. (Patient taking differently: as needed. PLACE 1 CAPSULE (18 MCG TOTAL) INTO INHALER AND INHALE DAILY.) 30 capsule 6   No facility-administered medications prior to visit.     Review of Systems  HENT: Positive for postnasal drip, rhinorrhea and trouble swallowing. Negative for sinus pressure.   Eyes: Positive for visual disturbance.       Left eye with issues  Respiratory: Positive for cough and wheezing. Negative for chest tightness and shortness of breath.        Green mucus  Cardiovascular: Negative for chest pain, palpitations and leg swelling.  Genitourinary: Negative.   Musculoskeletal: Negative.   Neurological: Positive for weakness. Negative for dizziness, tremors, seizures, syncope, facial asymmetry, speech difficulty,  light-headedness, numbness and headaches.       No balance       Objective:   Physical Exam  This is a video visit the patient is assessed on the video is in no distress is awake and alert able to move all fours talks normally has no evidence or sign of any bruising on the head.      Assessment & Plan:  I personally reviewed all images and lab data in the [facility] system as well as any outside material available during this office visit and agree with the  radiology impressions.   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 Current smoking consumption amount: 1/2 pack a day  . Dicsussion on advise to quit smoking and smoking impacts: Cardiovascular lung impacts  . Patient's willingness to quit: Not willing to quit  . Methods to quit smoking discussed: Behavioral modification  . Medication management of smoking session drugs discussed: She has failed Wellbutrin cannot take nicotine replacement due to her vascular disease and has failed Chantix therefore there is no medication can be prescribed  . Resources provided:  AVS   . Setting quit date not established  . Follow-up arranged 3 weeks   Time spent counseling the patient: 5 minutes    Visual field cut Worried about embolic stroke year and also with progressive carotid disease  Continue aspirin Urgent neurology and MRI evaluation of brain  Continue Rova statin  Bilateral carotid artery stenosis As per visual field cut assessment  Atrial fibrillation with RVR (HCC) Obtain EKG at next office visit  Continue diltiazem  Cerebrovascular accident (CVA) (HCC) I am quite convinced despite inability to do a physical exam over the video  system of the patient's neurologic status that she has had a stroke  I do not believe there is a head trauma induced injury  Urgent neurology consultation MRI of the brain is to be obtained  Moderate episode of recurrent major depressive disorder (HCC) Begin Celexa 20 mg daily  Referral to clinical social worker for behavioral therapy   Diagnoses and all orders for this visit:  Atrial fibrillation with RVR (HCC) -     Cancel: EKG 12-Lead -     diltiazem (CARTIA XT) 300 MG 24 hr capsule; TAKE 1 CAPSULE (300 MG TOTAL) BY MOUTH DAILY.  COPD with chronic bronchitis (HCC) -     Discontinue: tiotropium (SPIRIVA HANDIHALER) 18 MCG inhalation capsule; PLACE 1 CAPSULE (18 MCG TOTAL) INTO INHALER AND INHALE DAILY. -     tiotropium (SPIRIVA HANDIHALER) 18 MCG inhalation capsule; PLACE 1 CAPSULE (18 MCG TOTAL) INTO INHALER AND INHALE DAILY.  Carotid atherosclerosis, bilateral -     rosuvastatin (CRESTOR) 20 MG tablet; Take 1 tablet (20 mg total) by mouth daily.  Bilateral carotid artery stenosis -     rosuvastatin (CRESTOR) 20 MG tablet; Take 1 tablet (20 mg total) by mouth daily.  COPD with chronic bronchitis (HCC) -     Discontinue: tiotropium (SPIRIVA HANDIHALER) 18 MCG inhalation capsule; PLACE 1 CAPSULE (18 MCG TOTAL) INTO INHALER AND INHALE DAILY. -     tiotropium (SPIRIVA HANDIHALER) 18 MCG inhalation capsule; PLACE 1 CAPSULE (18 MCG TOTAL) INTO INHALER AND INHALE DAILY.  Cerebrovascular accident (CVA), unspecified mechanism (HCC) -     MR Brain W Wo Contrast; Future -     Ambulatory referral to Neurology  Tobacco dependence  Visual field cut  Moderate episode of recurrent major depressive disorder (HCC)  Other orders -     azithromycin (ZITHROMAX) 250 MG tablet; Take two once then one daily until gone -     melatonin 5 MG TABS; Take 3 tablets (15 mg total) by mouth at bedtime. -     ALPRAZolam (XANAX) 1 MG tablet; Take 1 tablet (1 mg total) by mouth once for 1 dose. 1/2 hour  before MRI -     albuterol (PROVENTIL) (2.5 MG/3ML) 0.083% nebulizer solution; Take 3 mLs (2.5 mg  total) by nebulization every 6 (six) hours as needed for wheezing or shortness of breath. -     albuterol (VENTOLIN HFA) 108 (90 Base) MCG/ACT inhaler; Inhale 1-2 puffs into the lungs every 6 (six) hours as needed for wheezing or shortness of breath. -     citalopram (CELEXA) 20 MG tablet; Take 1 tablet (20 mg total) by mouth daily.   Follow Up Instructions: Patient knows a direct exam will occur in the next 3 weeks in the office  An urgent referral to neurology will be made an MRI of the brain will be obtained I discussed the assessment and treatment plan with the patient. The patient was provided an opportunity to ask questions and all were answered. The patient agreed with the plan and demonstrated an understanding of the instructions.   The patient was advised to call back or seek an in-person evaluation if the symptoms worsen or if the condition fails to improve as anticipated.  I provided 45 minutes of non-face-to-face time during this encounter  including  median intraservice time , review of notes, labs, imaging, medications  and explaining diagnosis and management to the patient .    Shan Levans, MD

## 2020-09-26 ENCOUNTER — Telehealth: Payer: Self-pay | Admitting: Critical Care Medicine

## 2020-09-26 ENCOUNTER — Ambulatory Visit: Payer: Self-pay | Attending: Critical Care Medicine | Admitting: Critical Care Medicine

## 2020-09-26 ENCOUNTER — Other Ambulatory Visit: Payer: Self-pay

## 2020-09-26 ENCOUNTER — Ambulatory Visit: Payer: Self-pay | Admitting: *Deleted

## 2020-09-26 ENCOUNTER — Other Ambulatory Visit: Payer: Self-pay | Admitting: Critical Care Medicine

## 2020-09-26 ENCOUNTER — Encounter: Payer: Self-pay | Admitting: Critical Care Medicine

## 2020-09-26 DIAGNOSIS — J4489 Other specified chronic obstructive pulmonary disease: Secondary | ICD-10-CM

## 2020-09-26 DIAGNOSIS — F17200 Nicotine dependence, unspecified, uncomplicated: Secondary | ICD-10-CM

## 2020-09-26 DIAGNOSIS — I4891 Unspecified atrial fibrillation: Secondary | ICD-10-CM

## 2020-09-26 DIAGNOSIS — I6523 Occlusion and stenosis of bilateral carotid arteries: Secondary | ICD-10-CM

## 2020-09-26 DIAGNOSIS — J449 Chronic obstructive pulmonary disease, unspecified: Secondary | ICD-10-CM

## 2020-09-26 DIAGNOSIS — H5340 Unspecified visual field defects: Secondary | ICD-10-CM

## 2020-09-26 DIAGNOSIS — F331 Major depressive disorder, recurrent, moderate: Secondary | ICD-10-CM

## 2020-09-26 DIAGNOSIS — I639 Cerebral infarction, unspecified: Secondary | ICD-10-CM

## 2020-09-26 MED ORDER — ALBUTEROL SULFATE (2.5 MG/3ML) 0.083% IN NEBU: 2.5000 mg | INHALATION_SOLUTION | Freq: Four times a day (QID) | RESPIRATORY_TRACT | 2 refills | Status: DC | PRN

## 2020-09-26 MED ORDER — ALBUTEROL SULFATE HFA 108 (90 BASE) MCG/ACT IN AERS: 1.0000 | INHALATION_SPRAY | Freq: Four times a day (QID) | RESPIRATORY_TRACT | 0 refills | Status: DC | PRN

## 2020-09-26 MED ORDER — ALPRAZOLAM 1 MG PO TABS: 1.0000 mg | ORAL_TABLET | Freq: Once | ORAL | 0 refills | Status: AC

## 2020-09-26 MED ORDER — SPIRIVA HANDIHALER 18 MCG IN CAPS: ORAL_CAPSULE | RESPIRATORY_TRACT | 6 refills | Status: DC

## 2020-09-26 MED ORDER — MELATONIN 5 MG PO TABS: 15.0000 mg | ORAL_TABLET | Freq: Every day | ORAL | 1 refills | Status: DC

## 2020-09-26 MED ORDER — CITALOPRAM HYDROBROMIDE 20 MG PO TABS: 20.0000 mg | ORAL_TABLET | Freq: Every day | ORAL | 3 refills | Status: DC

## 2020-09-26 MED ORDER — AZITHROMYCIN 250 MG PO TABS: ORAL_TABLET | ORAL | 0 refills | Status: DC

## 2020-09-26 MED ORDER — DILTIAZEM HCL ER COATED BEADS 300 MG PO CP24: ORAL_CAPSULE | ORAL | 3 refills | Status: DC

## 2020-09-26 MED ORDER — ROSUVASTATIN CALCIUM 20 MG PO TABS: 20.0000 mg | ORAL_TABLET | Freq: Every day | ORAL | 6 refills | Status: DC

## 2020-09-26 MED FILL — CITALOPRAM HBR 20 MG TABLET: 20 | 30 days supply | Qty: 30 | Fill #0

## 2020-09-26 MED FILL — ALBUTEROL SULFATE HFA 108 (: 108 (90 BAS | 25 days supply | Qty: 18 | Fill #0

## 2020-09-26 MED FILL — AZITHROMYCIN 250 MG TABLET: 250 | 5 days supply | Qty: 6 | Fill #0

## 2020-09-26 NOTE — Assessment & Plan Note (Addendum)
We discussed briefly importance of smoking cessation we will reinforce this at the next visit     . Current smoking consumption amount: 1/2 pack a day  . Dicsussion on advise to quit smoking and smoking impacts: Cardiovascular lung impacts  . Patient's willingness to quit: Not willing to quit  . Methods to quit smoking discussed: Behavioral modification  . Medication management of smoking session drugs discussed: She has failed Wellbutrin cannot take nicotine replacement due to her vascular disease and has failed Chantix therefore there is no medication can be prescribed  . Resources provided:  AVS   . Setting quit date not established  . Follow-up arranged 3 weeks   Time spent counseling the patient: 5 minutes

## 2020-09-26 NOTE — Assessment & Plan Note (Signed)
COPD with chronic bronchitis now with likely acute flare  Plan will be for the patient receive azithromycin for a 5-day course this to be sent to her by mail from our pharmacy

## 2020-09-26 NOTE — Telephone Encounter (Signed)
Pt called in stating she fell last night and hit her head on the concrete and "It bounced like a basketball".   She was bent over getting something out of the cabinet and when she stood up "down I went".   Denied feeling chest pain or dizziness or shortness of breath when the fall occurred.   She takes an aspirin a day for the A. Fib.  She also mentioned that on Jan. 15 or 16 she woke up during the night with her left hand being limp at the wrist.   She could not move it at all.   She has not been seen by a doctor for this.   "I knew I had this appt coming up so I just waited".   "I'm not going to the emergency room at this hospital [facility]) because with covid the wait times are awful".   "The hospital in [place] is too far away plus with the icy weather I can't get there".   "I'm not going to the hospital in [place], I would not take my dog there".  She mentioned Dr. Jillyn Hidden was supposed to have ordered a CT scan and an MRI to make sure she had not had a stroke.   She never heard from anyone regarding these scans.  "So I just waited knowing I had this appt with Dr. Delford Field coming up".  I educated her on the importance of calling 911 for any symptoms of a stroke and I went over the s/s with her.   She assured me she would call.   I let her know also that Dr. Delford Field (She has a virtual appt with him this morning at 11:00) was probably going to send her to the ED.   "If he says I need to go then I will go".   I explained the need for a head CT to rule out she possibly having had a stroke plus the fall with the hit to the head last night to rule out a head injury inside that she can't see or feel.   She verbalized understanding and was willing to go if Dr. Delford Field wanted her to go.  She did mention she has regained partial use of her left hand at this point.   I can pick up a cup of coffee if I pick it up just right whereas before I couldn't.  She denied facial drooping.  Her  speech was clear and appropriate.  She answered my questions without hesitation.  I instructed her to call 911 if she had any symptoms no matter what they were between now and her 11:00 virtual visit with Dr. Delford Field and to be prepared to go to the ED most likely.   She was agreeable to this plan.     Reason for Disposition . [1] Numbness (i.e., loss of sensation) of the face, arm / hand, or leg / foot on one side of the body AND [2] sudden onset AND [3] brief (now gone)    It's been 2 wks since waking up during the night with her left hand numb and limp.  Answer Assessment - Initial Assessment Questions 1. SYMPTOM: "What is the main symptom you are concerned about?" (e.g., weakness, numbness)     Last night I fell.   Jan. 15 - 16 my left hand was just hanging there when I woke up during the night.    The CT scan and MRI was scheduled before the hand incident because  I was falling.     It's got some movement back.     I think I had a stroke.   Dr. Jillyn Hidden was supposed to schedule a CT scan and MRI but I never got a call for the scans.   My hand is getting better.    2. ONSET: "When did this start?" (minutes, hours, days; while sleeping)     Jan. 15-16 I woke up and my left hand was numb and I could not move it. Last night I was bent down to get something out of the cabinet and when I stood up I went down.   This is not the first fall I've had.    I've been falling  Since coming home from the hospital 2 yrs ago in May.    I was in hospital for my heart.   I have A.Fib when I was in the hospital.   I even hurt to breath today.   I hit the concrete floor last night.   My head hit the floor and bounced like a basketball.  No headache.  My back and legs hurt also.   3. LAST NORMAL: "When was the last time you were normal (no symptoms)?"     I can see good.  No headache.    I take aspirin every day.    She is refusing to go to the ED at [facility] due to the long wait times.  The Tylenol is not helping the pain.    4. PATTERN "Does this come and go, or has it been constant since it started?"  "Is it present now?"     The hand is better.   It was just the left hand that was limp at the wrist when I woke up during the night.    She has not seen a dr. Since this happened. 5. CARDIAC SYMPTOMS: "Have you had any of the following symptoms: chest pain, difficulty breathing, palpitations?"     She has A. Fib.   Was in the hospital 2 yrs ago and it was diagnosed. 6. NEUROLOGIC SYMPTOMS: "Have you had any of the following symptoms: headache, dizziness, vision loss, double vision, changes in speech, unsteady on your feet?"     Denies dizziness, headache, visual changes, drooping on one side of her face, no balance issues.  "I feel fine this morning other than hurting from the fall last night". 7. OTHER SYMPTOMS: "Do you have any other symptoms?"     See above 8. PREGNANCY: "Is there any chance you are pregnant?" "When was your last menstrual period?"     N/A due to age  Protocols used: NEUROLOGIC DEFICIT-A-AH

## 2020-09-26 NOTE — Assessment & Plan Note (Signed)
Worried about embolic stroke year and also with progressive carotid disease  Continue aspirin Urgent neurology and MRI evaluation of brain  Continue Rova statin

## 2020-09-26 NOTE — Assessment & Plan Note (Signed)
I am quite convinced despite inability to do a physical exam over the video system of the patient's neurologic status that she has had a stroke  I do not believe there is a head trauma induced injury  Urgent neurology consultation MRI of the brain is to be obtained

## 2020-09-26 NOTE — Assessment & Plan Note (Signed)
Begin Celexa 20 mg daily  Referral to clinical social worker for behavioral therapy

## 2020-09-26 NOTE — Assessment & Plan Note (Signed)
Obtain EKG at next office visit  Continue diltiazem

## 2020-09-26 NOTE — Assessment & Plan Note (Signed)
As per visual field cut assessment

## 2020-09-26 NOTE — Telephone Encounter (Signed)
Regarding the [facility] note from 758am today:  I saw the pt via video visit and she did not need to Go to ED.   MRI study is pending

## 2020-09-28 ENCOUNTER — Telehealth: Payer: Self-pay | Admitting: Critical Care Medicine

## 2020-09-28 NOTE — Telephone Encounter (Signed)
Copied from CRM [id number]. Topic: General - Inquiry >> Sep 28, 2020 11:17 AM Adrian Prince D wrote: Reason for CRM: Patient had call and was looking for a pain medication that the Dr. York Spaniel he would give her after her fall. She said he was to call in some tramadol. She would [facility] for [facility]. If you have any questions she can be reached at [phone].

## 2020-09-28 NOTE — Telephone Encounter (Deleted)
Informed that referral was placed and to be on the look out for a call from their office.

## 2020-09-28 NOTE — Telephone Encounter (Signed)
Given recent head injury I am not comfortable Rx tramadol at this time

## 2020-09-29 NOTE — Telephone Encounter (Signed)
Patient aware of message per Dr. Delford Field. Verbalized understanding.

## 2020-10-03 ENCOUNTER — Other Ambulatory Visit: Payer: Self-pay

## 2020-10-03 ENCOUNTER — Ambulatory Visit: Payer: Self-pay | Attending: Critical Care Medicine | Admitting: Licensed Clinical Social Worker

## 2020-10-03 DIAGNOSIS — F331 Major depressive disorder, recurrent, moderate: Secondary | ICD-10-CM

## 2020-10-03 NOTE — BH Specialist Note (Signed)
Integrated Behavioral Health via Telemedicine Visit  10/03/2020 ANGELES Case [id number]  Number of Integrated Behavioral Health visits: 1 Session Start time: 10:00 AM  Session End time: 10:20 AM Total time: 20  Referring Provider: Dr. Delford Field Patient/Family location: [facility] Provider location: Office All persons participating in visit: LCSW and Patient Types of Service: Individual psychotherapy  I connected with Carrie Case by Telephone  (Video is Caregility application) and verified that I am speaking with the correct person using two identifiers.Discussed confidentiality: Yes   I discussed the limitations of telemedicine and the availability of in person appointments.  Discussed there is a possibility of technology failure and discussed alternative modes of communication if that failure occurs.  I discussed that engaging in this telemedicine visit, they consent to the provision of behavioral healthcare and the services will be billed under their insurance.  Patient and/or legal guardian expressed understanding and consented to Telemedicine visit: Yes   Presenting Concerns: Patient and/or family reports the following symptoms/concerns: Pt reports that she recently had a stroke and having difficulty managing health conditions. Need help majority of the time. Endorses feelings of sadness, irritability, difficulty sleeping, jittery, low energy, recent falls Spouse passed approx 10 years ago and lost mother 6 months Cant concentrate to read or complete crochet Duration of problem: Ongoing; Severity of problem: moderate  Patient and/or Family's Strengths/Protective Factors: Social connections, Social and Emotional competence, Concrete supports in place (healthy food, safe environments, etc.) and Sense of purpose Adult daughter visits with pt to check in. Pt is still employed to stay active, compliant with medications  Goals Addressed: Patient will: 1.  Reduce symptoms of:  depression Pt agreed to continue compliance with medications 2.  Increase knowledge and/or ability of: coping skills and healthy habits Pt agreed to continue utilizing healthy coping skills (ie watching television) to assist with management of symptoms   Progress towards Goals: Ongoing  Interventions: Interventions utilized:  Solution-Focused Strategies Standardized Assessments completed: Not Needed  Patient and/or Family Response: Pt was engaged durign session and was able to identify strategies to assist with management of symptoms. Pt will think about utilizing a cane to assist with mobility  Assessment: Patient currently experiencing symptoms of depression triggered by chronic health conditions and grief.   Patient may benefit from therapy and continued medication management.  Plan: 1. Follow up with behavioral health clinician on : 10/10/20 2. Behavioral recommendations: Utilize strategies discussed 3. Referral(s): Integrated [employer] (In Clinic)  I discussed the assessment and treatment plan with the patient and/or parent/guardian. They were provided an opportunity to ask questions and all were answered. They agreed with the plan and demonstrated an understanding of the instructions.   They were advised to call back or seek an in-person evaluation if the symptoms worsen or if the condition fails to improve as anticipated.  Carrie Larsson, LCSW  10/10/20 9:01 AM

## 2020-10-05 NOTE — Progress Notes (Unsigned)
NEUROLOGY CONSULTATION NOTE  DARA BEIDLEMAN MRN: [id number] DOB: 04/11/1956  Referring provider: Shan Levans, MD Primary care provider: Shan Levans, MD  Reason for consult:  stroke   Subjective:  Carrie Case. Carrie Case is a 65 year old right-handed female with COPD, atrial fibrillation, bilateral carotid artery stenosis, chronic left-sided low back pain and tobacco dependence who presents for suspected stroke.  History supplemented by primary care notes.  For at least 1 1/2 to 2 years she has had frequent falls.  She will just be walking and suddenly drop.  Her legs give out.  No dizziness, numbness or weakness in legs.  It happens once in a while but not frequent.  About 6 months ago, she began having recurrent episodes of darkening vision in the left eye lasting a few minutes.  It has occurred a handful of times.  Carotid ultrasound from 06/10/2020 showed 40-59% stenosis in the left ICA and 1-39% stenosis in the right ICA.  Atrial fibrillation and carotid artery disease is followed by cardiology.  Around 09/01/2020, she woke up with left wrist drop.  She was unable to move her hand.  Since then, strength has improved but not at baseline.  She has localized neck pain but no radicular pain in the arm.  She has an MRI of the brain scheduled for 2/21.       PAST MEDICAL HISTORY: Past Medical History:  Diagnosis Date  . Acute respiratory failure with hypoxia (HCC) 12/22/2018  . Tobacco dependence     PAST SURGICAL HISTORY: Past Surgical History:  Procedure Laterality Date  . TUBAL LIGATION      MEDICATIONS: Current Outpatient Medications on File Prior to Visit  Medication Sig Dispense Refill  . albuterol (PROVENTIL) (2.5 MG/3ML) 0.083% nebulizer solution Take 3 mLs (2.5 mg total) by nebulization every 6 (six) hours as needed for wheezing or shortness of breath. 75 mL 2  . albuterol (VENTOLIN HFA) 108 (90 Base) MCG/ACT inhaler Inhale 1-2 puffs into the lungs every 6 (six) hours as  needed for wheezing or shortness of breath. 18 g 0  . aspirin EC 81 MG tablet Take 1 tablet (81 mg total) by mouth daily. 90 tablet 3  . azithromycin (ZITHROMAX) 250 MG tablet Take two once then one daily until gone 6 tablet 0  . citalopram (CELEXA) 20 MG tablet Take 1 tablet (20 mg total) by mouth daily. 30 tablet 3  . diltiazem (CARTIA XT) 300 MG 24 hr capsule TAKE 1 CAPSULE (300 MG TOTAL) BY MOUTH DAILY. 90 capsule 3  . melatonin 5 MG TABS Take 3 tablets (15 mg total) by mouth at bedtime. 60 tablet 1  . rosuvastatin (CRESTOR) 20 MG tablet Take 1 tablet (20 mg total) by mouth daily. 90 tablet 6  . tiotropium (SPIRIVA HANDIHALER) 18 MCG inhalation capsule PLACE 1 CAPSULE (18 MCG TOTAL) INTO INHALER AND INHALE DAILY. 30 capsule 6   No current facility-administered medications on file prior to visit.    ALLERGIES: Allergies  Allergen Reactions  . Ibuprofen Hives    FAMILY HISTORY: Family History  Problem Relation Age of Onset  . Hypertension Mother   . Hyperlipidemia Mother   . Cancer Mother   . COPD Mother   . Anxiety disorder Mother    SOCIAL HISTORY: Social History   Socioeconomic History  . Marital status: Widowed    Spouse name: Not on file  . Number of children: Not on file  . Years of education: Not on file  .  Highest education level: Not on file  Occupational History  . Occupation: unemployed  Tobacco Use  . Smoking status: Current Every Day Smoker    Packs/day: 0.50    Years: 47.00    Pack years: 23.50    Types: Cigarettes  . Smokeless tobacco: Never Used  Vaping Use  . Vaping Use: Never used  Substance and Sexual Activity  . Alcohol use: No  . Drug use: No  . Sexual activity: Not Currently  Other Topics Concern  . Not on file  Social History Narrative  . Not on file   Social Determinants of Health   Financial Resource Strain: Not on file  Food Insecurity: Not on file  Transportation Needs: Not on file  Physical Activity: Not on file  Stress: Not  on file  Social Connections: Not on file  Intimate Partner Violence: Not on file    Objective:  Blood pressure 114/73, pulse 75, height 5\' 6"  (1.676 m), weight 141 lb 9.6 oz (64.2 kg), SpO2 96 %. General: No acute distress.  Patient appears well-groomed.   Head:  Normocephalic/atraumatic Eyes:  fundi examined but not visualized Neck: supple, no paraspinal tenderness, full range of motion Back: No paraspinal tenderness Heart: regular rate and rhythm Lungs: Clear to auscultation bilaterally. Vascular: No carotid bruits. Neurological Exam: Mental status: alert and oriented to person, place, and time, recent and remote memory intact, fund of knowledge intact, attention and concentration intact, speech fluent and not dysarthric, language intact. Cranial nerves: CN I: not tested CN II: pupils equal, round and reactive to light, visual fields intact CN III, IV, VI:  full range of motion, no nystagmus, no ptosis CN V: facial sensation intact. CN VII: upper and lower face symmetric CN VIII: hearing intact CN IX, X: gag intact, uvula midline CN XI: sternocleidomastoid and trapezius muscles intact CN XII: tongue midline Bulk & Tone: normal, no fasciculations. Motor:  muscle strength 3/5 wrist extension and finger extension in left hand.  Otherwise, 5/5 throughout Sensation:  Pinprick, temperature and vibratory sensation intact. Deep Tendon Reflexes:  2+ throughout,  toes downgoing.   Finger to nose testing:  Without dysmetria.   Heel to shin:  Without dysmetria.   Gait:  Normal station and stride.  Romberg negative.  Assessment/Plan:   1.  Left wrist drop - stroke possible, but also consider radial nerve palsy at spiral groove, possibly related to positioning while sleeping in her recliner 2.  Recurrent transient vision loss in left eye, concerning for amaurosis fugax which can be explained by left moderate carotid stenosis.  She has not had a recurrence for several weeks. 3.  Left  carotid artery stenosis 4.  Drop attacks - consider seizure or vertebrobasilar insufficiency 5.  Atrial fibrillation 6.  Hyperlipidemia 7.  Tobacco dependence  1.  Agree with MRI of brain without contrast 2.  Also order CTA of head and neck (head to evaluate for vertebrobasilar insufficiency and neck for further evaluation of left carotid stenosis).  Pending results, will likely refer to vascular surgery 3.  Unfortunately, she has and does not qualify for the [employer].  Therefore, we would be unable to order: -  Routine EEG for further evaluation of drop attacks -  NCV-EMG left upper extremity to further evaluate wrist drop if MRI of brain unremarkable. 4.  Ideally, she would benefit from occupational therapy but she is without insurance.  In meantime, I told her to continue using stress ball and to work on exercises  for wrist extension 5.  Discussed tobacco use cessation 6.  Follow up after testing.    Thank you for allowing me to take part in the care of this patient.  Shon Millet, DO  CC: Shan Levans, MD

## 2020-10-06 ENCOUNTER — Ambulatory Visit (INDEPENDENT_AMBULATORY_CARE_PROVIDER_SITE_OTHER): Payer: Self-pay | Admitting: Neurology

## 2020-10-06 ENCOUNTER — Other Ambulatory Visit: Payer: Self-pay

## 2020-10-06 ENCOUNTER — Encounter: Payer: Self-pay | Admitting: Neurology

## 2020-10-06 VITALS — BP 114/73 | HR 75 | Ht 66.0 in | Wt 141.6 lb

## 2020-10-06 DIAGNOSIS — F17200 Nicotine dependence, unspecified, uncomplicated: Secondary | ICD-10-CM

## 2020-10-06 DIAGNOSIS — I6523 Occlusion and stenosis of bilateral carotid arteries: Secondary | ICD-10-CM

## 2020-10-06 DIAGNOSIS — M21332 Wrist drop, left wrist: Secondary | ICD-10-CM

## 2020-10-06 DIAGNOSIS — R55 Syncope and collapse: Secondary | ICD-10-CM

## 2020-10-06 DIAGNOSIS — G453 Amaurosis fugax: Secondary | ICD-10-CM

## 2020-10-06 NOTE — Patient Instructions (Signed)
1.  Will get CTA of head and neck.  Likely will refer you to a vascular surgeon as the narrowing of the artery in your neck may be causing the episodic vision loss. 2.  Get the MRI of brain as scheduled. 3.  We'll have to contact you regarding the EEG/brain wave testing 4.  Follow up after testing.

## 2020-10-10 ENCOUNTER — Ambulatory Visit: Payer: Medicaid Other | Attending: Critical Care Medicine | Admitting: Licensed Clinical Social Worker

## 2020-10-10 ENCOUNTER — Other Ambulatory Visit: Payer: Self-pay | Admitting: Critical Care Medicine

## 2020-10-10 ENCOUNTER — Ambulatory Visit (HOSPITAL_COMMUNITY)
Admission: RE | Admit: 2020-10-10 | Discharge: 2020-10-10 | Disposition: A | Discharge status: Home / Self Care | Payer: Medicaid Other | Source: Ambulatory Visit | Attending: Critical Care Medicine | Admitting: Critical Care Medicine

## 2020-10-10 ENCOUNTER — Other Ambulatory Visit: Payer: Self-pay

## 2020-10-10 DIAGNOSIS — I639 Cerebral infarction, unspecified: Secondary | ICD-10-CM

## 2020-10-10 DIAGNOSIS — F331 Major depressive disorder, recurrent, moderate: Secondary | ICD-10-CM

## 2020-10-10 NOTE — BH Specialist Note (Signed)
Integrated Behavioral Health via Telemedicine Visit  10/10/2020 Carrie Case [id number]  Number of Integrated Behavioral Health visits: 2 Session Start time: 9:00 AM  Session End time: 9:20 AM Total time: 20  Referring Provider: Dr. Delford Field  Patient/Family location: [facility] Provider location: Office All persons participating in visit: LCSW and Patient Types of Service: Individual psychotherapy  I connected with Carrie Case by Telephone  (Video is Caregility application) and verified that I am speaking with the correct person using two identifiers.Discussed confidentiality: Yes   I discussed the limitations of telemedicine and the availability of in person appointments.  Discussed there is a possibility of technology failure and discussed alternative modes of communication if that failure occurs.  I discussed that engaging in this telemedicine visit, they consent to the provision of behavioral healthcare and the services will be billed under their insurance.  Patient and/or legal guardian expressed understanding and consented to Telemedicine visit: Yes   Presenting Concerns: Patient and/or family reports the following symptoms/concerns: Pt lost personal belongings, including, identification resulting in increase in depression and anxiety symptoms. Endorses continued nervousness and low energy/motivation Duration of problem: Ongoing; Severity of problem: moderate  Patient and/or Family's Strengths/Protective Factors: Social connections, Social and Emotional competence, Concrete supports in place (healthy food, safe environments, etc.) and Sense of purpose  Goals Addressed: Patient will: 1.  Reduce symptoms of: anxiety and depression Pt agreed to continue compliance with medications 2.  Increase knowledge and/or ability of: stress reduction Pt agreed to practice mindfulness strategies (reminding self to "take it a day at a time"  Progress towards  Goals: Ongoing  Interventions: Interventions utilized:  Mindfulness or Relaxation Training Standardized Assessments completed: Not Needed  Patient Response: Pt was engaged in session and willing to practice new strategies to promote relaxation.   Assessment: Patient currently experiencing depression and anxiety symptoms triggered by psychosocial stressors. Pt is concerned about ability to afford medical bills.  Patient may benefit from continued therapy and medication management. LCSW discussed benefits of applying for financial assistance through [facility] and pt agreed to LCSW mailing application to address on file.   Plan: 1. Follow up with behavioral health clinician on : 10/19/20 2. Behavioral recommendations: Utilize strategies discussed, continue with medications, and complete Financial Assistance application 3. Referral(s): Integrated [employer] (In Clinic)  I discussed the assessment and treatment plan with the patient and/or parent/guardian. They were provided an opportunity to ask questions and all were answered. They agreed with the plan and demonstrated an understanding of the instructions.   They were advised to call back or seek an in-person evaluation if the symptoms worsen or if the condition fails to improve as anticipated.  Carrie Larsson, LCSW  10/10/20 9:28 AM

## 2020-10-19 ENCOUNTER — Ambulatory Visit: Payer: Medicaid Other | Admitting: Licensed Clinical Social Worker

## 2020-10-26 ENCOUNTER — Other Ambulatory Visit: Payer: Self-pay | Admitting: Critical Care Medicine

## 2020-10-26 ENCOUNTER — Ambulatory Visit: Payer: Medicaid Other | Admitting: Critical Care Medicine

## 2020-10-26 DIAGNOSIS — J449 Chronic obstructive pulmonary disease, unspecified: Secondary | ICD-10-CM

## 2020-10-26 MED ORDER — SPIRIVA HANDIHALER 18 MCG IN CAPS: ORAL_CAPSULE | RESPIRATORY_TRACT | 6 refills | Status: DC

## 2020-10-26 MED FILL — SPIRIVA 18 MCG CP-HANDIHALE: 18 | 30 days supply | Qty: 30 | Fill #0

## 2020-10-26 NOTE — Progress Notes (Deleted)
Subjective:    Patient ID: Carrie Case, female    DOB: 16-Dec-1955, 65 y.o.   MRN: [id number]  History of Present Illness: 65 y.o.    Former fulp pt Caf, copd  09/26/20 Last seen fulp 06/2020: This patient reports a history of frequent falls since November 2021.  The patient had been referred to neurology and an MRI of the brain was to been scheduled however this never occurred.  The patient had yet another fall earlier this morning hitting her head on the floor.  She states she has left arm weakness and the symptoms of progress since September 01, 2020.  She has inability to raise her left wrist.  She still has black spots in her eyes and feels like a kick curtain or shade is pulled over the left eye at times.  She denies any seizures.  She does have some headaches.  She also has increased productive cough of thick green mucus increased wheezing.  She still smoking half pack a day of cigarettes.  Patient notes poor dentition in the lower gums she does have dentures in the upper gums.  She was placed on Remeron in November for depression and to help with sleep she notes the falls of increased since being on the Remeron.  Prior history of atrial fibrillation in 2020 last EKG May 2020 showed chronic atrial fibrillation.  The patient had previously been requested to stay on aspirin from cardiology.  They called her a chads vascular 1 score low risk and did not need anticoagulation.  Note also with frequent falls anticoagulation would be contraindicated.  Notes the patient does not have a walker at this time.  Patient does have a pre-existing COPD. Patient also history of bilateral carotid artery stenosis with Dopplers done October 2021 showing 40 to 60% left carotid stenosis and 1 to 40% right internal carotid stenosis  Due to visual fields being abnormal she was to be sent to neurology but this consult never occurred.  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   Patient  is yet to receive the COVID vaccine.  She needs a mammogram tetanus vaccine colon cancer screening HCV screening and Pap smear   10/26/2020 Saw Neuro 10/10/20: .  Left wrist drop - stroke possible, but also consider radial nerve palsy at spiral groove, possibly related to positioning while sleeping in her recliner 2.  Recurrent transient vision loss in left eye, concerning for amaurosis fugax which can be explained by left moderate carotid stenosis.  She has not had a recurrence for several weeks. 3.  Left carotid artery stenosis 4.  Drop attacks - consider seizure or vertebrobasilar insufficiency 5.  Atrial fibrillation 6.  Hyperlipidemia 7.  Tobacco dependence  1.  Agree with MRI of brain without contrast 2.  Also order CTA of head and neck (head to evaluate for vertebrobasilar insufficiency and neck for further evaluation of left carotid stenosis).  Pending results, will likely refer to vascular surgery 3.  Unfortunately, she has [employer] and does not qualify for the [employer].  Therefore, we would be unable to order: -  Routine EEG for further evaluation of drop attacks -  NCV-EMG left upper extremity to further evaluate wrist drop if MRI of brain unremarkable. 4.  Ideally, she would benefit from occupational therapy but she is without insurance.  In meantime, I told her to continue using stress ball and to work on exercises for wrist extension 5.  Discussed tobacco  use cessation 6.  Follow up after testing.  MRI done: no CVA   Atrial fibrillation with RVR (HCC) -     Cancel: EKG 12-Lead -     diltiazem (CARTIA XT) 300 MG 24 hr capsule; TAKE 1 CAPSULE (300 MG TOTAL) BY MOUTH DAILY.  COPD with chronic bronchitis (HCC) -     Discontinue: tiotropium (SPIRIVA HANDIHALER) 18 MCG inhalation capsule; PLACE 1 CAPSULE (18 MCG TOTAL) INTO INHALER AND INHALE DAILY. -     tiotropium (SPIRIVA HANDIHALER) 18 MCG inhalation capsule; PLACE 1 CAPSULE (18 MCG TOTAL) INTO INHALER  AND INHALE DAILY.  Carotid atherosclerosis, bilateral -     rosuvastatin (CRESTOR) 20 MG tablet; Take 1 tablet (20 mg total) by mouth daily.  Bilateral carotid artery stenosis -     rosuvastatin (CRESTOR) 20 MG tablet; Take 1 tablet (20 mg total) by mouth daily.  COPD with chronic bronchitis (HCC) -     Discontinue: tiotropium (SPIRIVA HANDIHALER) 18 MCG inhalation capsule; PLACE 1 CAPSULE (18 MCG TOTAL) INTO INHALER AND INHALE DAILY. -     tiotropium (SPIRIVA HANDIHALER) 18 MCG inhalation capsule; PLACE 1 CAPSULE (18 MCG TOTAL) INTO INHALER AND INHALE DAILY.  Cerebrovascular accident (CVA), unspecified mechanism (HCC) -     MR Brain W Wo Contrast; Future -     Ambulatory referral to Neurology  Tobacco dependence  Visual field cut  Moderate episode of recurrent major depressive disorder (HCC)  Other orders -     azithromycin (ZITHROMAX) 250 MG tablet; Take two once then one daily until gone -     melatonin 5 MG TABS; Take 3 tablets (15 mg total) by mouth at bedtime. -     ALPRAZolam (XANAX) 1 MG tablet; Take 1 tablet (1 mg total) by mouth once for 1 dose. 1/2 hour before MRI -     albuterol (PROVENTIL) (2.5 MG/3ML) 0.083% nebulizer solution; Take 3 mLs (2.5 mg total) by nebulization every 6 (six) hours as needed for wheezing or shortness of breath. -     albuterol (VENTOLIN HFA) 108 (90 Base) MCG/ACT inhaler; Inhale 1-2 puffs into the lungs every 6 (six) hours as needed for wheezing or shortness of breath. -     citalopram (CELEXA) 20 MG tablet; Take 1 tablet (20 mg total) by mouth daily.    Past Medical History:  Diagnosis Date  . Acute respiratory failure with hypoxia (HCC) 12/22/2018  . Tobacco dependence      Family History  Problem Relation Age of Onset  . Hypertension Mother   . Hyperlipidemia Mother   . Cancer Mother   . COPD Mother   . Anxiety disorder Mother      Social History   Socioeconomic History  . Marital status: Widowed    Spouse name: Not  on file  . Number of children: Not on file  . Years of education: Not on file  . Highest education level: Not on file  Occupational History  . Occupation: unemployed  Tobacco Use  . Smoking status: Current Every Day Smoker    Packs/day: 0.50    Years: 47.00    Pack years: 23.50    Types: Cigarettes  . Smokeless tobacco: Never Used  Vaping Use  . Vaping Use: Never used  Substance and Sexual Activity  . Alcohol use: No  . Drug use: No  . Sexual activity: Not Currently  Other Topics Concern  . Not on file  Social History Narrative  . Not on file   Social  Determinants of Health   Financial Resource Strain: Not on file  Food Insecurity: Not on file  Transportation Needs: Not on file  Physical Activity: Not on file  Stress: Not on file  Social Connections: Not on file  Intimate Partner Violence: Not on file     Allergies  Allergen Reactions  . Ibuprofen Hives     Outpatient Medications Prior to Visit  Medication Sig Dispense Refill  . albuterol (PROVENTIL) (2.5 MG/3ML) 0.083% nebulizer solution Take 3 mLs (2.5 mg total) by nebulization every 6 (six) hours as needed for wheezing or shortness of breath. 75 mL 2  . albuterol (VENTOLIN HFA) 108 (90 Base) MCG/ACT inhaler Inhale 1-2 puffs into the lungs every 6 (six) hours as needed for wheezing or shortness of breath. 18 g 0  . aspirin EC 81 MG tablet Take 1 tablet (81 mg total) by mouth daily. 90 tablet 3  . azithromycin (ZITHROMAX) 250 MG tablet Take two once then one daily until gone 6 tablet 0  . citalopram (CELEXA) 20 MG tablet Take 1 tablet (20 mg total) by mouth daily. 30 tablet 3  . diltiazem (CARTIA XT) 300 MG 24 hr capsule TAKE 1 CAPSULE (300 MG TOTAL) BY MOUTH DAILY. 90 capsule 3  . melatonin 5 MG TABS Take 3 tablets (15 mg total) by mouth at bedtime. (Patient not taking: Reported on 10/06/2020) 60 tablet 1  . rosuvastatin (CRESTOR) 20 MG tablet Take 1 tablet (20 mg total) by mouth daily. 90 tablet 6  . tiotropium  (SPIRIVA HANDIHALER) 18 MCG inhalation capsule PLACE 1 CAPSULE (18 MCG TOTAL) INTO INHALER AND INHALE DAILY. 30 capsule 6   No facility-administered medications prior to visit.     Review of Systems  HENT: Positive for postnasal drip, rhinorrhea and trouble swallowing. Negative for sinus pressure.   Eyes: Positive for visual disturbance.       Left eye with issues  Respiratory: Positive for cough and wheezing. Negative for chest tightness and shortness of breath.        Green mucus  Cardiovascular: Negative for chest pain, palpitations and leg swelling.  Genitourinary: Negative.   Musculoskeletal: Negative.   Neurological: Positive for weakness. Negative for dizziness, tremors, seizures, syncope, facial asymmetry, speech difficulty, light-headedness, numbness and headaches.       No balance       Objective:   Physical Exam There were no vitals filed for this visit.  Gen: Pleasant, well-nourished, in no distress,  normal affect  ENT: No lesions,  mouth clear,  oropharynx clear, no postnasal drip  Neck: No JVD, no TMG, no carotid bruits  Lungs: No use of accessory muscles, no dullness to percussion, clear without rales or rhonchi  Cardiovascular: RRR, heart sounds normal, no murmur or gallops, no peripheral edema  Abdomen: soft and NT, no HSM,  BS normal  Musculoskeletal: No deformities, no cyanosis or clubbing  Neuro: alert, non focal  Skin: Warm, no lesions or rashes  No results found.  MRI 10/10/20 IMPRESSION: 1. No evidence of acute intracranial abnormality.  No acute infarct. 2. Remote right parietal infarct. 3. Right maxillary sinus mucosal thickening.     Assessment & Plan:  I personally reviewed all images and lab data in the [facility] system as well as any outside material available during this office visit and agree with the  radiology impressions.   No problem-specific Assessment & Plan notes found for this encounter.   There are no diagnoses linked to this  encounter. Follow Up  Instructions: Patient knows a direct exam will occur in the next 3 weeks in the office  An urgent referral to neurology will be made an MRI of the brain will be obtained I discussed the assessment and treatment plan with the patient. The patient was provided an opportunity to ask questions and all were answered. The patient agreed with the plan and demonstrated an understanding of the instructions.   The patient was advised to call back or seek an in-person evaluation if the symptoms worsen or if the condition fails to improve as anticipated.  I provided 45 minutes of non-face-to-face time during this encounter  including  median intraservice time , review of notes, labs, imaging, medications  and explaining diagnosis and management to the patient .    Shan Levans, MD

## 2020-10-26 NOTE — Telephone Encounter (Signed)
Requested Prescriptions  Pending Prescriptions Disp Refills  . tiotropium (SPIRIVA HANDIHALER) 18 MCG inhalation capsule 30 capsule 6    Sig: PLACE 1 CAPSULE (18 MCG TOTAL) INTO INHALER AND INHALE DAILY.     Pulmonology:  Anticholinergic Agents Passed - 10/26/2020  7:27 AM      Passed - Valid encounter within last 12 months    Recent Outpatient Visits          1 month ago Atrial fibrillation with RVR [facility])   [facility] Storm Frisk, MD   3 months ago Recurrent falls   [facility] Fulp, [place], MD   5 months ago Balance problem   [facility] Hoy Register, MD   1 year ago COPD with chronic bronchitis [facility])   [facility] Cain Saupe, MD   1 year ago COPD with chronic bronchitis [facility])   [facility] Storm Frisk, MD

## 2020-10-26 NOTE — Telephone Encounter (Signed)
Medication: tiotropium (SPIRIVA HANDIHALER) 18 MCG inhalation capsule  Has the pt contacted their pharmacy? No she usually gets through assistance program, but she is running low and has not filled form yet Preferred pharmacy: [facility] [place], [place] - [address]  Please be advised refills may take up to 3 business days.  We ask that you follow up with your pharmacy.

## 2020-11-18 MED FILL — ROSUVASTATIN CALCIUM 10 MG: 10 | 30 days supply | Qty: 30 | Fill #3

## 2020-11-18 MED FILL — CARTIA XT 300 MG CAPSULE SA: 300 | 30 days supply | Qty: 30 | Fill #2

## 2020-11-18 MED FILL — CITALOPRAM HBR 20 MG TABLET: 20 | 30 days supply | Qty: 30 | Fill #1

## 2020-11-19 ENCOUNTER — Other Ambulatory Visit: Payer: Self-pay

## 2020-11-26 ENCOUNTER — Telehealth: Payer: Medicaid Other | Admitting: Critical Care Medicine

## 2020-11-27 NOTE — Progress Notes (Signed)
Subjective:    Patient ID: Carrie Case, female    DOB: Jan 25, 1956, 65 y.o.   MRN: [id number] Virtual Visit via Video/telephone Note  I connected with@ on 11/28/20 at@ by a video/telephone enabled telemedicine application and verified that I am speaking with the correct person using two identifiers.   Consent:  I discussed the limitations, risks, security and privacy concerns of performing an evaluation and management service by video visit and the availability of in person appointments. I also discussed with the patient that there may be a patient responsible charge related to this service. The patient expressed understanding and agreed to proceed.  Location of patient: Patient's at home  Location of provider: I am in the office  Persons participating in the televisit with the patient.   No one else on call  Note this was a video visit however I could not hear the patient and she could not hear me so we stopped the video portion of the visit and switch to telephone.  She was in no distress when I saw her of the video initially but the majority of this visit was with telephone     History of Present Illness: 65 y.o.    Former fulp pt Caf, copd  09/26/20 Last seen fulp 06/2020: This patient reports a history of frequent falls since November 2021.  The patient had been referred to neurology and an MRI of the brain was to been scheduled however this never occurred.  The patient had yet another fall earlier this morning hitting her head on the floor.  She states she has left arm weakness and the symptoms of progress since September 01, 2020.  She has inability to raise her left wrist.  She still has black spots in her eyes and feels like a kick curtain or shade is pulled over the left eye at times.  She denies any seizures.  She does have some headaches.  She also has increased productive cough of thick green mucus increased wheezing.  She still smoking half pack a day of cigarettes.   Patient notes poor dentition in the lower gums she does have dentures in the upper gums.  She was placed on Remeron in November for depression and to help with sleep she notes the falls of increased since being on the Remeron.  Prior history of atrial fibrillation in 2020 last EKG May 2020 showed chronic atrial fibrillation.  The patient had previously been requested to stay on aspirin from cardiology.  They called her a chads vascular 1 score low risk and did not need anticoagulation.  Note also with frequent falls anticoagulation would be contraindicated.  Notes the patient does not have a walker at this time.  Patient does have a pre-existing COPD. Patient also history of bilateral carotid artery stenosis with Dopplers done October 2021 showing 40 to 60% left carotid stenosis and 1 to 40% right internal carotid stenosis  Due to visual fields being abnormal she was to be sent to neurology but this consult never occurred.  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   Patient is yet to receive the COVID vaccine.  She needs a mammogram tetanus vaccine colon cancer screening HCV screening and Pap smear   11/28/20 This patient was seen by way of a telehealth visit.  Initially I had her on video which she cannot hear me and I could not hear her however we could see each other.  I then switched to telephone  connection only.  This patient has history of weakness in the left wrist with frequent falls this is completely resolved now she still has some visual field cuts.  She had a brain MRI that was negative.  She has seen neurology and they are requesting a CT of the head and neck along with EEG EMG studies however she does not have any insurance and cannot afford this.  [place]  Patient still smoking about 10 cigarettes a day.  She has no other real complaints at this time.  Patient's breathing is better and she  is now on the Spiriva inhaler. Below is a copy of the neurology evaluation in February  Neuro eval 1.  Left wrist drop - stroke possible, but also consider radial nerve palsy at spiral groove, possibly related to positioning while sleeping in her recliner 2.  Recurrent transient vision loss in left eye, concerning for amaurosis fugax which can be explained by left moderate carotid stenosis.  She has not had a recurrence for several weeks. 3.  Left carotid artery stenosis 4.  Drop attacks - consider seizure or vertebrobasilar insufficiency 5.  Atrial fibrillation 6.  Hyperlipidemia 7.  Tobacco dependence  1.  Agree with MRI of brain without contrast 2.  Also order CTA of head and neck (head to evaluate for vertebrobasilar insufficiency and neck for further evaluation of left carotid stenosis).  Pending results, will likely refer to vascular surgery 3.  Unfortunately, she has [employer] and does not qualify for the [employer].  Therefore, we would be unable to order: -  Routine EEG for further evaluation of drop attacks -  NCV-EMG left upper extremity to further evaluate wrist drop if MRI of brain unremarkable. 4.  Ideally, she would benefit from occupational therapy but she is without insurance.  In meantime, I told her to continue using stress ball and to work on exercises for wrist extension 5.  Discussed tobacco use cessation 6.  Follow up after testing.    Past Medical History:  Diagnosis Date  . Acute respiratory failure with hypoxia (HCC) 12/22/2018  . Tobacco dependence      Family History  Problem Relation Age of Onset  . Hypertension Mother   . Hyperlipidemia Mother   . Cancer Mother   . COPD Mother   . Anxiety disorder Mother      Social History   Socioeconomic History  . Marital status: Widowed    Spouse name: Not on file  . Number of children: Not on file  . Years of education: Not on file  . Highest education level: Not on file  Occupational  History  . Occupation: unemployed  Tobacco Use  . Smoking status: Current Every Day Smoker    Packs/day: 0.50    Years: 47.00    Pack years: 23.50    Types: Cigarettes  . Smokeless tobacco: Never Used  Vaping Use  . Vaping Use: Never used  Substance and Sexual Activity  . Alcohol use: No  . Drug use: No  . Sexual activity: Not Currently  Other Topics Concern  . Not on file  Social History Narrative  . Not on file   Social Determinants of Health   Financial Resource Strain: Not on file  Food Insecurity: Not on file  Transportation Needs: Not on file  Physical Activity: Not on file  Stress: Not on file  Social Connections: Not on file  Intimate  Partner Violence: Not on file     Allergies  Allergen Reactions  . Ibuprofen Hives     Outpatient Medications Prior to Visit  Medication Sig Dispense Refill  . albuterol (PROVENTIL) (2.5 MG/3ML) 0.083% nebulizer solution TAKE 3 MLS (2.5 MG TOTAL) BY NEBULIZATION EVERY 6 (SIX) HOURS AS NEEDED FOR WHEEZING OR SHORTNESS OF BREATH. 75 mL 2  . albuterol (VENTOLIN HFA) 108 (90 Base) MCG/ACT inhaler INHALE 1-2 PUFFS INTO THE LUNGS EVERY 6 (SIX) HOURS AS NEEDED FOR WHEEZING OR SHORTNESS OF BREATH. 18 g 0  . aspirin EC 81 MG tablet Take 1 tablet (81 mg total) by mouth daily. 90 tablet 3  . citalopram (CELEXA) 20 MG tablet TAKE 1 TABLET (20 MG TOTAL) BY MOUTH DAILY. 30 tablet 3  . diltiazem (CARDIZEM CD) 300 MG 24 hr capsule TAKE 1 CAPSULE (300 MG TOTAL) BY MOUTH DAILY. 90 capsule 3  . rosuvastatin (CRESTOR) 20 MG tablet TAKE 1 TABLET (20 MG TOTAL) BY MOUTH DAILY. 90 tablet 6  . tiotropium (SPIRIVA) 18 MCG inhalation capsule PLACE 1 CAPSULE (18 MCG TOTAL) INTO INHALER AND INHALE DAILY. 30 capsule 6  . azithromycin (ZITHROMAX) 250 MG tablet TAKE 2 TABLETS BY MOUTH ON DAY 1 THEN TAKE 1 TABLET DAILY FOR THE NEXT 4 DAYS (Patient not taking: Reported on 11/28/2020) 6 tablet 0  . diltiazem (CARDIZEM CD) 300 MG 24 hr capsule TAKE 1 CAPSULE (300 MG  TOTAL) BY MOUTH DAILY. 90 capsule 3  . melatonin 5 MG TABS TAKE 3 TABLETS (15 MG TOTAL) BY MOUTH AT BEDTIME. (Patient not taking: Reported on 11/28/2020) 60 tablet 1   No facility-administered medications prior to visit.     Review of Systems  HENT: Negative for postnasal drip, rhinorrhea, sinus pressure and trouble swallowing.   Eyes: Positive for visual disturbance.       Left eye with issues  Respiratory: Negative for cough, chest tightness, shortness of breath and wheezing.        Green mucus  Cardiovascular: Negative for chest pain, palpitations and leg swelling.  Gastrointestinal: Negative.   Genitourinary: Negative.   Musculoskeletal: Negative.   Neurological: Negative for dizziness, tremors, seizures, syncope, facial asymmetry, speech difficulty, weakness, light-headedness, numbness and headaches.       No balance       Objective:   Physical Exam  This is a video visit the patient is assessed on the video is in no distress is awake and alert   I switched to telephone as I could not hear the     Assessment & Plan:  I personally reviewed all images and lab data in the [facility] system as well as any outside material available during this office visit and agree with the  radiology impressions.   Atrial fibrillation with RVR (HCC) Atrial fibrillation with good control right now on diltiazem no changes  Moderate episode of recurrent major depressive disorder (HCC) Improved on Celexa  Wrist drop Left wrist drop improved no further falls  Brain MRI normal  Patient needs CT of the head and neck with vascular contrast and needs an EEG and nerve conduction study but needs insurance coverage for this which she does not have it currently therefore the studies cannot be performed  COPD with chronic bronchitis (HCC) Continue inhaled medications   Diagnoses and all orders for this visit:  Atrial fibrillation with RVR (HCC)  Moderate episode of recurrent major depressive  disorder (HCC)  Left wristdrop  COPD with chronic bronchitis (HCC)  The total of 20 minutes was spent on this visit including time spent reviewing the records in the computer Follow Up Instructions: Patient knows a a follow-up visit will be arranged   I discussed the assessment and treatment plan with the patient. The patient was provided an opportunity to ask questions and all were answered. The patient agreed with the plan and demonstrated an understanding of the instructions.   The patient was advised to call back or seek an in-person evaluation if the symptoms worsen or if the condition fails to improve as anticipated.  I provided 20 minutes of non-face-to-face time during this encounter  including  median intraservice time , review of notes, labs, imaging, medications  and explaining diagnosis and management to the patient .    Shan Levans, MD

## 2020-11-28 ENCOUNTER — Ambulatory Visit: Payer: Self-pay | Attending: Critical Care Medicine | Admitting: Critical Care Medicine

## 2020-11-28 ENCOUNTER — Other Ambulatory Visit: Payer: Self-pay

## 2020-11-28 ENCOUNTER — Encounter: Payer: Self-pay | Admitting: Critical Care Medicine

## 2020-11-28 DIAGNOSIS — M21332 Wrist drop, left wrist: Secondary | ICD-10-CM

## 2020-11-28 DIAGNOSIS — F331 Major depressive disorder, recurrent, moderate: Secondary | ICD-10-CM

## 2020-11-28 DIAGNOSIS — M21339 Wrist drop, unspecified wrist: Secondary | ICD-10-CM | POA: Insufficient documentation

## 2020-11-28 DIAGNOSIS — J449 Chronic obstructive pulmonary disease, unspecified: Secondary | ICD-10-CM

## 2020-11-28 DIAGNOSIS — I4891 Unspecified atrial fibrillation: Secondary | ICD-10-CM

## 2020-11-28 NOTE — Assessment & Plan Note (Signed)
Atrial fibrillation with good control right now on diltiazem no changes

## 2020-11-28 NOTE — Assessment & Plan Note (Signed)
Continue inhaled medications 

## 2020-11-28 NOTE — Assessment & Plan Note (Signed)
Left wrist drop improved no further falls  Brain MRI normal  Patient needs CT of the head and neck with vascular contrast and needs an EEG and nerve conduction study but needs insurance coverage for this which she does not have it currently therefore the studies cannot be performed

## 2020-11-28 NOTE — Assessment & Plan Note (Signed)
Improved on Celexa.

## 2021-01-13 ENCOUNTER — Other Ambulatory Visit: Payer: Self-pay

## 2021-01-13 MED FILL — Diltiazem HCl Coated Beads Cap ER 24HR 300 MG: ORAL | 30 days supply | Qty: 30 | Fill #0 | Status: AC

## 2021-01-13 MED FILL — Rosuvastatin Calcium Tab 20 MG: ORAL | 30 days supply | Qty: 30 | Fill #0 | Status: AC

## 2021-02-17 ENCOUNTER — Other Ambulatory Visit: Payer: Self-pay

## 2021-02-17 MED FILL — Diltiazem HCl Coated Beads Cap ER 24HR 300 MG: ORAL | 30 days supply | Qty: 30 | Fill #1 | Status: AC

## 2021-02-17 MED FILL — Rosuvastatin Calcium Tab 20 MG: ORAL | 30 days supply | Qty: 30 | Fill #1 | Status: AC

## 2021-03-24 ENCOUNTER — Other Ambulatory Visit: Payer: Self-pay

## 2021-03-24 MED FILL — Rosuvastatin Calcium Tab 20 MG: ORAL | 30 days supply | Qty: 30 | Fill #2 | Status: AC

## 2021-03-24 MED FILL — Diltiazem HCl Coated Beads Cap ER 24HR 300 MG: ORAL | 30 days supply | Qty: 30 | Fill #2 | Status: AC

## 2021-03-28 ENCOUNTER — Other Ambulatory Visit: Payer: Self-pay

## 2021-04-29 ENCOUNTER — Other Ambulatory Visit (HOSPITAL_COMMUNITY): Payer: Self-pay

## 2021-05-11 ENCOUNTER — Other Ambulatory Visit: Payer: Self-pay

## 2021-05-11 MED FILL — Rosuvastatin Calcium Tab 20 MG: ORAL | 30 days supply | Qty: 30 | Fill #3 | Status: AC

## 2021-05-11 MED FILL — Diltiazem HCl Coated Beads Cap ER 24HR 300 MG: ORAL | 30 days supply | Qty: 30 | Fill #3 | Status: AC

## 2021-06-09 ENCOUNTER — Other Ambulatory Visit (HOSPITAL_COMMUNITY): Payer: Self-pay | Admitting: Interventional Cardiology

## 2021-06-09 DIAGNOSIS — I771 Stricture of artery: Secondary | ICD-10-CM

## 2021-06-09 DIAGNOSIS — I6522 Occlusion and stenosis of left carotid artery: Secondary | ICD-10-CM

## 2021-06-15 ENCOUNTER — Other Ambulatory Visit: Payer: Self-pay

## 2021-06-15 ENCOUNTER — Ambulatory Visit (HOSPITAL_COMMUNITY)
Admission: RE | Admit: 2021-06-15 | Discharge: 2021-06-15 | Disposition: A | Discharge status: Home / Self Care | Payer: Self-pay | Source: Ambulatory Visit | Attending: Cardiology | Admitting: Cardiology

## 2021-06-15 DIAGNOSIS — I6522 Occlusion and stenosis of left carotid artery: Secondary | ICD-10-CM | POA: Insufficient documentation

## 2021-06-15 DIAGNOSIS — I771 Stricture of artery: Secondary | ICD-10-CM | POA: Insufficient documentation

## 2021-06-19 ENCOUNTER — Other Ambulatory Visit: Payer: Self-pay | Admitting: *Deleted

## 2021-06-19 DIAGNOSIS — I771 Stricture of artery: Secondary | ICD-10-CM

## 2021-06-22 ENCOUNTER — Other Ambulatory Visit: Payer: Self-pay

## 2021-06-22 ENCOUNTER — Encounter: Payer: Self-pay | Admitting: Physician Assistant

## 2021-06-22 ENCOUNTER — Ambulatory Visit: Payer: Self-pay | Attending: Physician Assistant | Admitting: Physician Assistant

## 2021-06-22 VITALS — BP 109/75 | HR 116 | Ht 66.0 in | Wt 143.5 lb

## 2021-06-22 DIAGNOSIS — B070 Plantar wart: Secondary | ICD-10-CM

## 2021-06-22 DIAGNOSIS — J449 Chronic obstructive pulmonary disease, unspecified: Secondary | ICD-10-CM

## 2021-06-22 DIAGNOSIS — M7661 Achilles tendinitis, right leg: Secondary | ICD-10-CM

## 2021-06-22 MED FILL — Rosuvastatin Calcium Tab 20 MG: ORAL | 30 days supply | Qty: 30 | Fill #4 | Status: AC

## 2021-06-22 MED FILL — Diltiazem HCl Coated Beads Cap ER 24HR 300 MG: ORAL | 30 days supply | Qty: 30 | Fill #4 | Status: AC

## 2021-06-22 NOTE — Progress Notes (Signed)
Carrie Case, is a 65 y.o. female  YTK:[id number]  FTD:[id number]  DOB - 08/21/1955  Chief Complaint  Patient presents with   Leg Pain    Right leg and foot pain x 1 year       Subjective:   Carrie Case is a 65 y.o. female here today for for pain in R foot and R achilles tendon.  This has been going on for many months but now worse.  She has been trying to "dig out" the area and it has come back worse.  Pain with weight bearing.    Also would like to see a pulmonologist as she feels like her wheezing is worse at times.  Still smoking 1/2ppd cigs  No problems updated.  ALLERGIES: Allergies  Allergen Reactions   Ibuprofen Hives    PAST MEDICAL HISTORY: Past Medical History:  Diagnosis Date   Acute respiratory failure with hypoxia (HCC) 12/22/2018   Tobacco dependence     MEDICATIONS AT HOME: Prior to Admission medications   Medication Sig Start Date End Date Taking? Authorizing Provider  albuterol (PROVENTIL) (2.5 MG/3ML) 0.083% nebulizer solution TAKE 3 MLS (2.5 MG TOTAL) BY NEBULIZATION EVERY 6 (SIX) HOURS AS NEEDED FOR WHEEZING OR SHORTNESS OF BREATH. 09/26/20 09/26/21 Yes Storm Frisk, MD  albuterol (VENTOLIN HFA) 108 (90 Base) MCG/ACT inhaler INHALE 1-2 PUFFS INTO THE LUNGS EVERY 6 (SIX) HOURS AS NEEDED FOR WHEEZING OR SHORTNESS OF BREATH. 09/26/20 09/26/21 Yes Storm Frisk, MD  aspirin EC 81 MG tablet Take 1 tablet (81 mg total) by mouth daily. 01/08/19  Yes Corky Crafts, MD  citalopram (CELEXA) 20 MG tablet TAKE 1 TABLET (20 MG TOTAL) BY MOUTH DAILY. 09/26/20 09/26/21 Yes Storm Frisk, MD  diltiazem (CARDIZEM CD) 300 MG 24 hr capsule TAKE 1 CAPSULE (300 MG TOTAL) BY MOUTH DAILY. 09/26/20 09/26/21 Yes Storm Frisk, MD  rosuvastatin (CRESTOR) 20 MG tablet TAKE 1 TABLET (20 MG TOTAL) BY MOUTH DAILY. 09/26/20 09/26/21 Yes Storm Frisk, MD  tiotropium (SPIRIVA) 18 MCG inhalation capsule PLACE 1 CAPSULE (18 MCG TOTAL) INTO INHALER AND INHALE DAILY. 10/26/20  10/26/21 Yes Storm Frisk, MD    ROS: Neg HEENT Neg resp Neg cardiac Neg GI Neg GU Neg MS Neg psych Neg neuro  Objective:   Vitals:   06/22/21 1011  BP: 109/75  Pulse: (!) 116  SpO2: 95%  Weight: 143 lb 8 oz (65.1 kg)  Height: 5\' 6"  (1.676 m)   Exam General appearance : Awake, alert, not in any distress. Speech Clear. Not toxic looking;  appears much older than stated age.   HEENT: Atraumatic and Normocephalic Neck: Supple, no JVD. No cervical lymphadenopathy.  Chest: fair air entry bilaterally. No rales/rhonchi.  But there is mild wheezing throughout CVS: S1 S2 regular, no murmurs.  Abdomen: Bowel sounds present, Non tender and not distended with no gaurding, rigidity or rebound. R heel of foot with large plantar's wart and surrounding callous.  Her achilles tendon is TTP Neurology: Awake alert, and oriented X 3, CN II-XII intact, Non focal Skin: No Rash  Data Review Lab Results  Component Value Date   HGBA1C 5.6 12/22/2018    Assessment & Plan   1. Achilles tendinitis of right lower extremity Night splints to stretch the tendon and plantar fascia - Ambulatory referral to Podiatry  2. Plantar wart Weight displacement bandage made and ace wrap - Ambulatory referral to Podiatry  3. COPD with chronic bronchitis (HCC) She did not need  RF of her inhalers today - Ambulatory referral to Pulmonology    Patient have been counseled extensively about nutrition and exercise. Other issues discussed during this visit include: low cholesterol diet, weight control and daily exercise, foot care, annual eye examinations at Ophthalmology, importance of adherence with medications and regular follow-up. We also discussed long term complications of uncontrolled diabetes and hypertension.   Return in about 3 months (around 09/22/2021) for chronic conditions.  The patient was given clear instructions to go to ER or return to medical center if symptoms don't improve, worsen or  new problems develop. The patient verbalized understanding. The patient was told to call to get lab results if they haven't heard anything in the next week.      Georgian Co, PA-C [facility] [place], [place] [phone]   06/22/2021, 10:45 AM Patient ID: Carrie Case, female   DOB: 09-30-1955, 65 y.o.   MRN: [id number]

## 2021-06-22 NOTE — Patient Instructions (Signed)
Achilles Tendinitis Achilles tendinitis is inflammation of the tough, cord-like band that attaches the lower leg muscles to the heel bone (Achilles tendon). This is usually caused by overusing the tendon and the ankle joint. Achilles tendinitis usually gets better over time with treatment and caring for yourself at home. It can take weeks or months to heal completely. What are the causes? This condition may be caused by: A sudden increase in exercise or activity, such as running. Doing the same exercises or activities, such as jumping, over and over. Not warming up calf muscles before exercising. Exercising in shoes that are worn out or not made for exercise. Having arthritis or a bone growth (spur) on the back of the heel bone. This can rub against the tendon and hurt it. Age-related wear and tear. Tendons become less flexible with age and are more likely to be injured. What are the signs or symptoms? Common symptoms of this condition include: Pain in the Achilles tendon or in the back of the leg, just above the heel. The pain usually gets worse with exercise. Stiffness or soreness in the back of the leg, especially in the morning. Swelling of the skin over the Achilles tendon. Thickening of the tendon. Trouble standing on tiptoe. How is this diagnosed? This condition is diagnosed based on your symptoms and a physical exam. You may have tests, including: X-rays. MRI. How is this treated? The goal of treatment is to relieve symptoms and help your injury heal. Treatment may include: Decreasing or stopping activities that caused the tendinitis. This may mean switching to low-impact exercises like biking or swimming. Icing the injured area. Doing physical therapy, including strengthening and stretching exercises. Taking NSAIDs, such as ibuprofen, to help relieve pain and swelling. Using supportive shoes, wraps, heel lifts, or a walking boot (air cast). Having surgery. This may be done if  your symptoms do not improve after other treatments. Using high-energy shock wave impulses to stimulate the healing process (extracorporeal shock wave therapy). This is rare. Having an injection of medicines that help relieve inflammation (corticosteroids). This is rare. Follow these instructions at home: If you have an air cast: Wear the air cast as told by your health care provider. Remove it only as told by your health care provider. Loosen it if your toes tingle, become numb, or turn cold and blue. Keep it clean. If the air cast is not waterproof: Do not let it get wet. Cover it with a watertight covering when you take a bath or shower. Managing pain, stiffness, and swelling  If directed, put ice on the injured area. To do this: If you have a removable air cast, remove it as told by your health care provider. Put ice in a plastic bag. Place a towel between your skin and the bag. Leave the ice on for 20 minutes, 2-3 times a day. Move your toes often to reduce stiffness and swelling. Raise (elevate) your foot above the level of your heart while you are sitting or lying down. Activity Gradually return to your normal activities as told by your health care provider. Ask your health care provider what activities are safe for you. Do not do activities that cause pain. Consider doing low-impact exercises, like cycling or swimming. Ask your health care provider when it is safe to drive if you have an air cast on your foot. If physical therapy was prescribed, do exercises as told by your health care provider or physical therapist. General instructions If directed, wrap your foot   with an elastic bandage or other wrap. This can help to keep your tendon from moving too much while it heals. Your health care provider will show you how to wrap your foot correctly. Wear supportive shoes or heel lifts only as told by your health care provider. Take over-the-counter and prescription medicines only as  told by your health care provider. Keep all follow-up visits as told by your health care provider. This is important. Contact a health care provider if you: Have symptoms that get worse. Have pain that does not get better with medicine. Develop new, unexplained symptoms. Develop warmth and swelling in your foot. Have a fever. Get help right away if you: Have a sudden popping sound or sensation in your Achilles tendon followed by severe pain. Cannot move your toes or foot. Cannot put any weight on your foot. Your foot or toes become numb and look white or blue even after loosening your bandage or air cast. Summary Achilles tendinitis is inflammation of the tough, cord-like band that attaches the lower leg muscles to the heel bone (Achilles tendon). This condition is usually caused by overusing the tendon and the ankle joint. It can also be caused by arthritis or normal aging. The most common symptoms of this condition include pain, swelling, or stiffness in the Achilles tendon or in the back of the leg. This condition is usually treated by decreasing or stopping activities that caused the tendinitis, icing the injured area, taking NSAIDs, and doing physical therapy. This information is not intended to replace advice given to you by your health care provider. Make sure you discuss any questions you have with your health care provider. Document Revised: 12/22/2018 Document Reviewed: 12/22/2018 Elsevier Patient Education  2022 Elsevier Inc.  

## 2021-07-18 ENCOUNTER — Ambulatory Visit: Payer: Medicaid Other | Admitting: Cardiovascular Disease

## 2021-07-19 ENCOUNTER — Other Ambulatory Visit (HOSPITAL_COMMUNITY): Payer: Self-pay | Admitting: Interventional Cardiology

## 2021-07-19 DIAGNOSIS — I6523 Occlusion and stenosis of bilateral carotid arteries: Secondary | ICD-10-CM

## 2021-07-26 ENCOUNTER — Ambulatory Visit: Payer: Self-pay | Admitting: Podiatry

## 2021-07-26 ENCOUNTER — Other Ambulatory Visit: Payer: Self-pay

## 2021-07-26 MED FILL — Rosuvastatin Calcium Tab 20 MG: ORAL | 30 days supply | Qty: 30 | Fill #5 | Status: AC

## 2021-07-26 MED FILL — Albuterol Sulfate Soln Nebu 0.083% (2.5 MG/3ML): RESPIRATORY_TRACT | 7 days supply | Qty: 75 | Fill #0 | Status: AC

## 2021-07-26 MED FILL — Diltiazem HCl Coated Beads Cap ER 24HR 300 MG: ORAL | 30 days supply | Qty: 30 | Fill #5 | Status: AC

## 2021-08-16 ENCOUNTER — Institutional Professional Consult (permissible substitution): Payer: Medicaid Other | Admitting: Internal Medicine

## 2021-08-23 ENCOUNTER — Other Ambulatory Visit: Payer: Self-pay

## 2021-08-23 ENCOUNTER — Encounter: Payer: Self-pay | Admitting: *Deleted

## 2021-08-23 ENCOUNTER — Encounter: Payer: Self-pay | Admitting: Interventional Cardiology

## 2021-08-23 ENCOUNTER — Ambulatory Visit (INDEPENDENT_AMBULATORY_CARE_PROVIDER_SITE_OTHER): Payer: Self-pay | Admitting: Interventional Cardiology

## 2021-08-23 VITALS — BP 110/74 | HR 77 | Ht 66.0 in | Wt 139.0 lb

## 2021-08-23 DIAGNOSIS — E782 Mixed hyperlipidemia: Secondary | ICD-10-CM

## 2021-08-23 DIAGNOSIS — Z720 Tobacco use: Secondary | ICD-10-CM

## 2021-08-23 DIAGNOSIS — I480 Paroxysmal atrial fibrillation: Secondary | ICD-10-CM

## 2021-08-23 DIAGNOSIS — I771 Stricture of artery: Secondary | ICD-10-CM

## 2021-08-23 DIAGNOSIS — I6523 Occlusion and stenosis of bilateral carotid arteries: Secondary | ICD-10-CM

## 2021-08-23 NOTE — Progress Notes (Signed)
Cardiology Office Note   Date:  08/23/2021   ID:  Carrie Case, DOB 12-Sep-1955, MRN [id number]  PCP:  Elsie Stain, MD    Chief Complaint  Patient presents with   Follow-up  AFib   Wt Readings from Last 3 Encounters:  08/23/21 139 lb (63 kg)  06/22/21 143 lb 8 oz (65.1 kg)  10/06/20 141 lb 9.6 oz (64.2 kg)       History of Present Illness: Carrie Case is a 66 y.o. female  with diagnosed AFib. She did not have sx.    The patient does not have symptoms concerning for COVID-19 infection (fever, chills, cough, or new shortness of breath).    She had a lung infection and was found to have AFib.  She feels tired and irritable.     Echo in 12/2018: The left ventricle has normal systolic function, with an ejection fraction of 60-65%. The cavity size was normal. There is mildly increased left ventricular wall thickness. Left ventricular diastolic function could not be evaluated secondary to  atrial fibrillation.  2. The right ventricle has normal systolc function. The cavity was normal. Right ventricular systolic pressure is mildly elevated with an estimated pressure of 34.6 mmHg.  3. Left atrial size was mildly dilated.  4. The mitral valve is grossly normal. There is mild mitral annular calcification present.  5. The tricuspid valve was grossly normal.  6. The aortic valve is tricuspid Mild thickening of the aortic valve. Aortic valve regurgitation is trivial by color flow Doppler. No stenosis of the aortic valve.  7. The inferior vena cava was dilated in size with >50% respiratory variability.  8. Normal LV systolic function; mild LVH; mild LAE; mild TR; mild pulmonary hypertension.   She struggled with stopping smoking.    Has had fatigue: "May be multifactorial.  Will stop metoprolol. "- noted in 2019.  Has had some rare fluttering.  Nothing sustained.  Symptoms may last just a minute or two.  She has not seen any irregular heart rate alerts from her blood  pressure cuff.  Cut back to < 1/2 ppd, down from 2-3 ppd.      Past Medical History:  Diagnosis Date   Acute respiratory failure with hypoxia ([place]) 12/22/2018   Tobacco dependence     Past Surgical History:  Procedure Laterality Date   TUBAL LIGATION       Current Outpatient Medications  Medication Sig Dispense Refill   albuterol (PROVENTIL) (2.5 MG/3ML) 0.083% nebulizer solution TAKE 3 MLS (2.5 MG TOTAL) BY NEBULIZATION EVERY 6 (SIX) HOURS AS NEEDED FOR WHEEZING OR SHORTNESS OF BREATH. 75 mL 2   albuterol (VENTOLIN HFA) 108 (90 Base) MCG/ACT inhaler INHALE 1-2 PUFFS INTO THE LUNGS EVERY 6 (SIX) HOURS AS NEEDED FOR WHEEZING OR SHORTNESS OF BREATH. 18 g 0   aspirin EC 81 MG tablet Take 1 tablet (81 mg total) by mouth daily. 90 tablet 3   diltiazem (CARDIZEM CD) 300 MG 24 hr capsule TAKE 1 CAPSULE (300 MG TOTAL) BY MOUTH DAILY. 90 capsule 3   rosuvastatin (CRESTOR) 20 MG tablet TAKE 1 TABLET (20 MG TOTAL) BY MOUTH DAILY. 90 tablet 6   tiotropium (SPIRIVA) 18 MCG inhalation capsule PLACE 1 CAPSULE (18 MCG TOTAL) INTO INHALER AND INHALE DAILY. 30 capsule 6   No current facility-administered medications for this visit.    Allergies:   Ibuprofen    Social History:  The patient  reports that she has been smoking cigarettes.  She has a 23.50 pack-year smoking history. She has never used smokeless tobacco. She reports that she does not drink alcohol and does not use drugs.   Family History:  The patient's family history includes Anxiety disorder in her mother; COPD in her mother; Cancer in her mother; Hyperlipidemia in her mother; Hypertension in her mother.    ROS:  Please see the history of present illness.   Otherwise, review of systems are positive for .   All other systems are reviewed and negative.    PHYSICAL EXAM: VS:  BP 110/74    Pulse 77    Ht 5\' 6"  (1.676 m)    Wt 139 lb (63 kg)    SpO2 97%    BMI 22.44 kg/m  , BMI Body mass index is 22.44 kg/m. GEN: Well nourished, well  developed, in no acute distress HEENT: normal Neck: no JVD, carotid bruits, or masses Cardiac: RRR; no murmurs, rubs, or gallops,no edema  Respiratory:  clear to auscultation bilaterally, normal work of breathing GI: soft, nontender, nondistended, + BS MS: no deformity or atrophy Skin: warm and dry, no rash Neuro:  Strength and sensation are intact Psych: euthymic mood, full affect   EKG:   The ekg ordered today demonstrates NSR, no ST changes   Recent Labs: No results found for requested labs within last 8760 hours.   Lipid Panel    Component Value Date/Time   CHOL 125 12/23/2018 0633   TRIG 190 (H) 12/23/2018 0633   HDL 26 (L) 12/23/2018 0633   CHOLHDL 4.8 12/23/2018 0633   VLDL 38 12/23/2018 0633   LDLCALC 61 12/23/2018 7341     Other studies Reviewed: Additional studies/ records that were reviewed today with results demonstrating: labs reviewed.   ASSESSMENT AND PLAN:  AFib: in NSR.  Continue diltiazem and aspirin.  Check blood pressure at home several times a week.  Let us know if the machine detects any irregular heart rate.  A. fib occurred in the setting of a lung infection.  We have not pursued long-term anticoagulation. PAD: left subclavian stenosis/carotid disease.  She has an appointment to follow-up with Dr. Kirke Corin.  No significant signs of left arm ischemia.  No lightheadedness.  Left radial pulse is diminished compared to the right.  Of note, she is very afraid of being put to sleep for surgery.  I did explain to her that if this blockage becomes a problem, a stent may be sufficient to treat it and this could be done without general anesthesia.  She was very relieved. Hyperlipidemia: Continue lipid-lowering therapy.  LDL 61 in 2020.  Her primary care doctor is in [place] and follows her lab work.  Continue high potency statin given her PAD. She prefers to follow-up in [place].  We will arrange general cardiology follow-up for her in the evening.  She will see Dr. Kirke Corin  here in [place]. Tobacco abuse: She needs to stop smoking.  She has done well cutting back but stopping completely would be beneficial.  She already knows she has emphysema.   Current medicines are reviewed at length with the patient today.  The patient concerns regarding her medicines were addressed.  The following changes have been made:  No change  Labs/ tests ordered today include:  No orders of the defined types were placed in this encounter.   Recommend 150 minutes/week of aerobic exercise Low fat, low carb, high fiber diet recommended  Disposition:   FU in 1 year in [place]  SignedLarae Grooms, MD  08/23/2021 10:19 AM    [name] [place], [place], [place]  [address] Phone: [phone]; Fax: [phone]

## 2021-08-23 NOTE — Patient Instructions (Signed)
Medication Instructions:  Your physician recommends that you continue on your current medications as directed. Please refer to the Current Medication list given to you today.  *If you need a refill on your cardiac medications before your next appointment, please call your pharmacy*   Lab Work: none If you have labs (blood work) drawn today and your tests are completely normal, you will receive your results only by: MyChart Message (if you have MyChart) OR A paper copy in the mail If you have any lab test that is abnormal or we need to change your treatment, we will call you to review the results.   Testing/Procedures: none   Follow-Up: At [facility], you and your health needs are our priority.  As part of our continuing mission to provide you with exceptional heart care, we have created designated Provider Care Teams.  These Care Teams include your primary Cardiologist (physician) and Advanced Practice Providers (APPs -  Physician Assistants and Nurse Practitioners) who all work together to provide you with the care you need, when you need it.  We recommend signing up for the patient portal called "MyChart".  Sign up information is provided on this After Visit Summary.  MyChart is used to connect with patients for Virtual Visits (Telemedicine).  Patients are able to view lab/test results, encounter notes, upcoming appointments, etc.  Non-urgent messages can be sent to your provider as well.   To learn more about what you can do with MyChart, go to [email or web address].    Your next appointment:   12 month(s)  The format for your next appointment:   In Person  Provider:   In } In [place] or [place]   Other Instructions

## 2021-08-28 ENCOUNTER — Other Ambulatory Visit: Payer: Self-pay

## 2021-08-28 ENCOUNTER — Ambulatory Visit: Payer: Self-pay | Admitting: Podiatry

## 2021-08-28 MED FILL — Diltiazem HCl Coated Beads Cap ER 24HR 300 MG: ORAL | 30 days supply | Qty: 30 | Fill #0 | Status: AC

## 2021-08-28 MED FILL — Rosuvastatin Calcium Tab 20 MG: ORAL | 30 days supply | Qty: 30 | Fill #0 | Status: AC

## 2021-08-29 ENCOUNTER — Ambulatory Visit: Payer: Self-pay | Admitting: Podiatry

## 2021-08-29 ENCOUNTER — Other Ambulatory Visit: Payer: Self-pay

## 2021-08-29 ENCOUNTER — Ambulatory Visit (INDEPENDENT_AMBULATORY_CARE_PROVIDER_SITE_OTHER): Payer: Self-pay | Admitting: Cardiovascular Disease

## 2021-08-29 ENCOUNTER — Encounter: Payer: Self-pay | Admitting: Cardiovascular Disease

## 2021-08-29 VITALS — BP 100/70 | HR 86 | Ht 66.0 in | Wt 139.0 lb

## 2021-08-29 DIAGNOSIS — I771 Stricture of artery: Secondary | ICD-10-CM

## 2021-08-29 DIAGNOSIS — I739 Peripheral vascular disease, unspecified: Secondary | ICD-10-CM

## 2021-08-29 NOTE — Progress Notes (Signed)
Cardiology Office Note   Date:  08/29/2021   ID:  Carrie Case, DOB Jun 27, 1956, MRN [id number]  PCP:  Storm Frisk, MD  Cardiologist:  Dr. Eldridge Dace  Chief Complaint  Patient presents with   New Patient (Initial Visit)      History of Present Illness: Carrie Case is a 66 y.o. female who was referred by Dr. Eldridge Dace for evaluation of left subclavian artery stenosis.  She has known history of paroxysmal atrial fibrillation, prolonged history of tobacco use and hyperlipidemia. She has been smoking for 50 years. She had carotid artery duplex done in October 2022 which showed moderate left carotid stenosis.  There was incidental finding of left subclavian artery stenosis with bidirectional flow in the left vertebral artery and 32 mm blood pressure difference between the right arm and the left arm.  In spite of that, the patient denies any left arm claudication and has no neurologic symptoms with using the left arm. She is more bothered by right foot and calf claudication that happens after short distance walking.  She has a callus on the bottom of the right heel that has been very tender and has affected her ability to work. She denies chest pain.  She does have chronic exertional dyspnea related to prolonged history of smoking.    Past Medical History:  Diagnosis Date   Acute respiratory failure with hypoxia (HCC) 12/22/2018   Tobacco dependence     Past Surgical History:  Procedure Laterality Date   TUBAL LIGATION       Current Outpatient Medications  Medication Sig Dispense Refill   albuterol (PROVENTIL) (2.5 MG/3ML) 0.083% nebulizer solution TAKE 3 MLS (2.5 MG TOTAL) BY NEBULIZATION EVERY 6 (SIX) HOURS AS NEEDED FOR WHEEZING OR SHORTNESS OF BREATH. 75 mL 2   albuterol (VENTOLIN HFA) 108 (90 Base) MCG/ACT inhaler INHALE 1-2 PUFFS INTO THE LUNGS EVERY 6 (SIX) HOURS AS NEEDED FOR WHEEZING OR SHORTNESS OF BREATH. 18 g 0   aspirin EC 81 MG tablet Take 1 tablet (81 mg  total) by mouth daily. 90 tablet 3   diltiazem (CARDIZEM CD) 300 MG 24 hr capsule TAKE 1 CAPSULE (300 MG TOTAL) BY MOUTH DAILY. 90 capsule 3   rosuvastatin (CRESTOR) 20 MG tablet TAKE 1 TABLET (20 MG TOTAL) BY MOUTH DAILY. 90 tablet 6   tiotropium (SPIRIVA) 18 MCG inhalation capsule PLACE 1 CAPSULE (18 MCG TOTAL) INTO INHALER AND INHALE DAILY. 30 capsule 6   No current facility-administered medications for this visit.    Allergies:   Ibuprofen    Social History:  The patient  reports that she has been smoking cigarettes. She has a 23.50 pack-year smoking history. She has never used smokeless tobacco. She reports that she does not drink alcohol and does not use drugs.   Family History:  The patient's family history includes Anxiety disorder in her mother; COPD in her mother; Cancer in her mother; Hyperlipidemia in her mother; Hypertension in her mother.    ROS:  Please see the history of present illness.   Otherwise, review of systems are positive for none.   All other systems are reviewed and negative.    PHYSICAL EXAM: VS:  BP 100/70 (BP Location: Right Arm, Patient Position: Sitting, Cuff Size: Normal) Comment: Right arm.   Pulse 86    Ht 5\' 6"  (1.676 m)    Wt 139 lb (63 kg)    BMI 22.44 kg/m  , BMI Body mass index is 22.44 kg/m. GEN:  Well nourished, well developed, in no acute distress  HEENT: normal  Neck: no JVD, carotid bruits, or masses Cardiac: RRR; no murmurs, rubs, or gallops,no edema  Respiratory:  clear to auscultation bilaterally, normal work of breathing GI: soft, nontender, nondistended, + BS MS: no deformity or atrophy  Skin: warm and dry, no rash Neuro:  Strength and sensation are intact Psych: euthymic mood, full affect Vascular: Radial pulse: +2 on the right and very faint on the left.  Femoral: +2 bilaterally, dorsalis pedis is not palpable bilaterally.  Posterior tibial is faint but palpable on the left and not the right.   EKG:  EKG is not ordered  today.    Recent Labs: No results found for requested labs within last 8760 hours.    Lipid Panel    Component Value Date/Time   CHOL 125 12/23/2018 0633   TRIG 190 (H) 12/23/2018 0633   HDL 26 (L) 12/23/2018 0633   CHOLHDL 4.8 12/23/2018 0633   VLDL 38 12/23/2018 0633   LDLCALC 61 12/23/2018 0633      Wt Readings from Last 3 Encounters:  08/29/21 139 lb (63 kg)  08/23/21 139 lb (63 kg)  06/22/21 143 lb 8 oz (65.1 kg)          ASSESSMENT AND PLAN:  1.  Left subclavian artery stenosis: The patient has evidence of left subclavian artery stenosis.  However, she appears to be asymptomatic at the present time and thus revascularization is not indicated.  She will let me know if she develops symptoms.  I agree that her blood pressure should be checked in the right arm and not the left arm as it is more reflective of her central aortic pressure.  2.  Peripheral arterial disease with claudication worse on the right side.  The patient has evidence of peripheral arterial disease by physical exam with claudication.  Requested lower extremity arterial Doppler. She does have a callus on the bottom of the right foot and she has an appointment scheduled with Dr. Lilian Kapur today.  3.  Tobacco use: I discussed with her the importance of smoking cessation.  4.  Paroxysmal atrial fibrillation: She seems to be in sinus rhythm.  5.  Hyperlipidemia: Continue treatment with rosuvastatin with a target LDL of less than 70.    Disposition:   FU with me based on the results of lower extremity arterial Doppler.  Signed,  [name], MD  08/29/2021 11:12 AM    [facility]

## 2021-08-29 NOTE — Patient Instructions (Addendum)
Medication Instructions:  No changes *If you need a refill on your cardiac medications before your next appointment, please call your pharmacy*   Lab Work: None ordered If you have labs (blood work) drawn today and your tests are completely normal, you will receive your results only by: MyChart Message (if you have MyChart) OR A paper copy in the mail If you have any lab test that is abnormal or we need to change your treatment, we will call you to review the results.   Testing/Procedures: Your physician has requested that you have a lower extremity segmental doppler.    Follow-Up: At [facility], you and your health needs are our priority.  As part of our continuing mission to provide you with exceptional heart care, we have created designated Provider Care Teams.  These Care Teams include your primary Cardiologist (physician) and Advanced Practice Providers (APPs -  Physician Assistants and Nurse Practitioners) who all work together to provide you with the care you need, when you need it.  We recommend signing up for the patient portal called "MyChart".  Sign up information is provided on this After Visit Summary.  MyChart is used to connect with patients for Virtual Visits (Telemedicine).  Patients are able to view lab/test results, encounter notes, upcoming appointments, etc.  Non-urgent messages can be sent to your provider as well.   To learn more about what you can do with MyChart, go to [email or web address].    Your next appointment:   Follow up pending the doppler results with Dr. Kirke Corin  Steps to Quit Smoking Smoking tobacco is the leading cause of preventable death. It can affect almost every organ in the body. Smoking puts you and people around you at risk for many serious, long-lasting (chronic) diseases. Quitting smoking can be hard, but it is one of the best things that you can do for your health. It is never too late to quit. How do I get ready to quit? When you  decide to quit smoking, make a plan to help you succeed. Before you quit: Pick a date to quit. Set a date within the next 2 weeks to give you time to prepare. Write down the reasons why you are quitting. Keep this list in places where you will see it often. Tell your family, friends, and co-workers that you are quitting. Their support is important. Talk with your doctor about the choices that may help you quit. Find out if your health insurance will pay for these treatments. Know the people, places, things, and activities that make you want to smoke (triggers). Avoid them. What first steps can I take to quit smoking? Throw away all cigarettes at home, at work, and in your car. Throw away the things that you use when you smoke, such as ashtrays and lighters. Clean your car. Make sure to empty the ashtray. Clean your home, including curtains and carpets. What can I do to help me quit smoking? Talk with your doctor about taking medicines and seeing a counselor at the same time. You are more likely to succeed when you do both. If you are pregnant or breastfeeding, talk with your doctor about counseling or other ways to quit smoking. Do not take medicine to help you quit smoking unless your doctor tells you to do so. To quit smoking: Quit right away Quit smoking totally, instead of slowly cutting back on how much you smoke over a period of time. Go to counseling. You are more likely to quit if you  go to counseling sessions regularly. Take medicine You may take medicines to help you quit. Some medicines need a prescription, and some you can buy over-the-counter. Some medicines may contain a drug called nicotine to replace the nicotine in cigarettes. Medicines may: Help you to stop having the desire to smoke (cravings). Help to stop the problems that come when you stop smoking (withdrawal symptoms). Your doctor may ask you to use: Nicotine patches, gum, or lozenges. Nicotine inhalers or  sprays. Non-nicotine medicine that is taken by mouth. Find resources Find resources and other ways to help you quit smoking and remain smoke-free after you quit. These resources are most helpful when you use them often. They include: Online chats with a Veterinary surgeoncounselor. Phone quitlines. Printed Materials engineerself-help materials. Support groups or group counseling. Text messaging programs. Mobile phone apps. Use apps on your mobile phone or tablet that can help you stick to your quit plan. There are many free apps for mobile phones and tablets as well as websites. Examples include Quit Guide from the [employer] and smokefree.gov  What things can I do to make it easier to quit?  Talk to your family and friends. Ask them to support and encourage you. Call a phone quitline (1-800-QUIT-NOW), reach out to support groups, or work with a Veterinary surgeoncounselor. Ask people who smoke to not smoke around you. Avoid places that make you want to smoke, such as: Bars. Parties. Smoke-break areas at work. Spend time with people who do not smoke. Lower the stress in your life. Stress can make you want to smoke. Try these things to help your stress: Getting regular exercise. Doing deep-breathing exercises. Doing yoga. Meditating. Doing a body scan. To do this, close your eyes, focus on one area of your body at a time from head to toe. Notice which parts of your body are tense. Try to relax the muscles in those areas. How will I feel when I quit smoking? Day 1 to 3 weeks Within the first 24 hours, you may start to have some problems that come from quitting tobacco. These problems are very bad 2-3 days after you quit, but they do not often last for more than 2-3 weeks. You may get these symptoms: Mood swings. Feeling restless, nervous, angry, or annoyed. Trouble concentrating. Dizziness. Strong desire for high-sugar foods and nicotine. Weight gain. Trouble pooping (constipation). Feeling like you may vomit (nausea). Coughing or a sore  throat. Changes in how the medicines that you take for other issues work in your body. Depression. Trouble sleeping (insomnia). Week 3 and afterward After the first 2-3 weeks of quitting, you may start to notice more positive results, such as: Better sense of smell and taste. Less coughing and sore throat. Slower heart rate. Lower blood pressure. Clearer skin. Better breathing. Fewer sick days. Quitting smoking can be hard. Do not give up if you fail the first time. Some people need to try a few times before they succeed. Do your best to stick to your quit plan, and talk with your doctor if you have any questions or concerns. Summary Smoking tobacco is the leading cause of preventable death. Quitting smoking can be hard, but it is one of the best things that you can do for your health. When you decide to quit smoking, make a plan to help you succeed. Quit smoking right away, not slowly over a period of time. When you start quitting, seek help from your doctor, family, or friends. This information is not intended to replace advice given  to you by your health care provider. Make sure you discuss any questions you have with your health care provider. Document Revised: 04/14/2021 Document Reviewed: 10/25/2018 Elsevier Patient Education  2022 [employer].

## 2021-09-20 ENCOUNTER — Institutional Professional Consult (permissible substitution): Payer: Medicaid Other | Admitting: Internal Medicine

## 2021-09-20 NOTE — Progress Notes (Deleted)
° °  Carrie Case, female    DOB: 07/31/1956,    MRN: [id number]   Brief patient profile:  ***  yo*** *** referred to pulmonary clinic in [place]  09/20/2021 by *** for ***      History of Present Illness  09/20/2021  Pulmonary/ 1st office eval/ Sherene Sires / [place] [facility]  No chief complaint on file.    Dyspnea:  *** Cough: *** Sleep: *** SABA use:   Past Medical History:  Diagnosis Date   Acute respiratory failure with hypoxia (HCC) 12/22/2018   Tobacco dependence     Outpatient Medications Prior to Visit  Medication Sig Dispense Refill   albuterol (PROVENTIL) (2.5 MG/3ML) 0.083% nebulizer solution TAKE 3 MLS (2.5 MG TOTAL) BY NEBULIZATION EVERY 6 (SIX) HOURS AS NEEDED FOR WHEEZING OR SHORTNESS OF BREATH. 75 mL 2   albuterol (VENTOLIN HFA) 108 (90 Base) MCG/ACT inhaler INHALE 1-2 PUFFS INTO THE LUNGS EVERY 6 (SIX) HOURS AS NEEDED FOR WHEEZING OR SHORTNESS OF BREATH. 18 g 0   aspirin EC 81 MG tablet Take 1 tablet (81 mg total) by mouth daily. 90 tablet 3   diltiazem (CARDIZEM CD) 300 MG 24 hr capsule TAKE 1 CAPSULE (300 MG TOTAL) BY MOUTH DAILY. 90 capsule 3   rosuvastatin (CRESTOR) 20 MG tablet TAKE 1 TABLET (20 MG TOTAL) BY MOUTH DAILY. 90 tablet 6   tiotropium (SPIRIVA) 18 MCG inhalation capsule PLACE 1 CAPSULE (18 MCG TOTAL) INTO INHALER AND INHALE DAILY. 30 capsule 6   No facility-administered medications prior to visit.     Objective:     There were no vitals taken for this visit.         Assessment   No problem-specific Assessment & Plan notes found for this encounter.     Sandrea Hughs, MD 09/20/2021

## 2021-09-26 ENCOUNTER — Ambulatory Visit (INDEPENDENT_AMBULATORY_CARE_PROVIDER_SITE_OTHER): Payer: Self-pay

## 2021-09-26 ENCOUNTER — Other Ambulatory Visit: Payer: Self-pay

## 2021-09-26 DIAGNOSIS — I739 Peripheral vascular disease, unspecified: Secondary | ICD-10-CM

## 2021-10-03 ENCOUNTER — Other Ambulatory Visit: Payer: Self-pay | Admitting: *Deleted

## 2021-10-03 DIAGNOSIS — I739 Peripheral vascular disease, unspecified: Secondary | ICD-10-CM

## 2021-10-06 ENCOUNTER — Other Ambulatory Visit: Payer: Self-pay

## 2021-10-06 ENCOUNTER — Other Ambulatory Visit: Payer: Self-pay | Admitting: Critical Care Medicine

## 2021-10-06 DIAGNOSIS — I4891 Unspecified atrial fibrillation: Secondary | ICD-10-CM

## 2021-10-06 DIAGNOSIS — I6523 Occlusion and stenosis of bilateral carotid arteries: Secondary | ICD-10-CM

## 2021-10-06 MED ORDER — DILTIAZEM HCL ER COATED BEADS 300 MG PO CP24
ORAL_CAPSULE | Freq: Every day | ORAL | 0 refills | Status: DC
  Filled 2021-10-06: qty 30, 30d supply, fill #0
  Filled 2021-11-09: qty 30, 30d supply, fill #1
  Filled 2022-01-02: qty 30, 30d supply, fill #2

## 2021-10-06 MED ORDER — ROSUVASTATIN CALCIUM 20 MG PO TABS
ORAL_TABLET | Freq: Every day | ORAL | 0 refills | Status: DC
  Filled 2021-10-06: qty 30, 30d supply, fill #0
  Filled 2021-11-09: qty 30, 30d supply, fill #1
  Filled 2022-01-02: qty 30, 30d supply, fill #2

## 2021-10-31 ENCOUNTER — Ambulatory Visit: Payer: Medicaid Other | Admitting: Cardiovascular Disease

## 2021-11-01 ENCOUNTER — Other Ambulatory Visit: Payer: Self-pay

## 2021-11-01 ENCOUNTER — Ambulatory Visit (INDEPENDENT_AMBULATORY_CARE_PROVIDER_SITE_OTHER): Payer: Self-pay | Admitting: Internal Medicine

## 2021-11-01 ENCOUNTER — Encounter: Payer: Self-pay | Admitting: Internal Medicine

## 2021-11-01 DIAGNOSIS — J449 Chronic obstructive pulmonary disease, unspecified: Secondary | ICD-10-CM

## 2021-11-01 MED ORDER — ALBUTEROL SULFATE (2.5 MG/3ML) 0.083% IN NEBU
3.0000 mL | INHALATION_SOLUTION | Freq: Four times a day (QID) | RESPIRATORY_TRACT | 2 refills | Status: DC | PRN
Start: 2021-11-01 — End: 2021-11-10
  Filled 2021-11-01: qty 90, 8d supply, fill #0
  Filled 2021-11-09: qty 75, 7d supply, fill #0

## 2021-11-01 NOTE — Patient Instructions (Signed)
Plan A = Automatic = Always=    Spiriva one capsule each am  ? ?Plan B = Backup (to supplement plan A, not to replace it) ?Only use your albuterol inhaler as a rescue medication to be used if you can't catch your breath by resting or doing a relaxed purse lip breathing pattern.  ?- The less you use it, the better it will work when you need it. ?- Ok to use the inhaler up to 2 puffs  every 4 hours if you must but call for appointment if use goes up over your usual need ?- Don't leave home without it !!  (think of it like the spare tire for your car)  ? ?Plan C = Crisis (instead of Plan B but only if Plan B stops working) ?- only use your albuterol nebulizer if you first try Plan B and it fails to help > ok to use the nebulizer up to every 4 hours but if start needing it regularly call for immediate appointment ? ?The key is to stop smoking completely before smoking completely stops you! ? ? ?Please schedule a follow up visit in 3 months but call sooner if needed with pfts on return  ?

## 2021-11-01 NOTE — Progress Notes (Signed)
? ?Carrie Case, female    DOB: 12/24/1955  MRN: [id number] ? ? ?Brief patient profile:  ?46  yowf  active smoker  referred to pulmonary clinic in [place]  11/01/2021 by Dr Dorothy Puffer for doe.  ? ? ?History of Present Illness  ?11/01/2021  Pulmonary/ 1st office eval/ Melvyn Novas / [facility]  ?Chief Complaint  ?Patient presents with  ? Consult  ?  Referred by Dr. Thereasa Solo for Chronic bronchitis and COPD ?  ?Dyspnea:  MMRC2 = can't walk a nl pace on a flat grade s sob but does fine slow and flat no better on spiriva /some better on neb  ?Cough: none  ?Sleep: 3-4 x per week 30-45 degrees due to habit  ?SABA use: neb twice daily on avg - typically p exertion  ? ?No obvious day to day or daytime variability or assoc excess/ purulent sputum or mucus plugs or hemoptysis or cp or chest tightness, subjective wheeze or overt sinus or hb symptoms.  ? ?Sleeping  without nocturnal  or early am exacerbation  of respiratory  c/o's or need for noct saba. Also denies any obvious fluctuation of symptoms with weather or environmental changes or other aggravating or alleviating factors except as outlined above  ? ?No unusual exposure hx or h/o childhood pna/ asthma or knowledge of premature birth. ? ?Current Allergies, Complete Past Medical History, Past Surgical History, Family History, and Social History were reviewed in [employer] record. ? ?ROS  The following are not active complaints unless bolded ?Hoarseness, sore throat, dysphagia, dental problems, itching, sneezing,  nasal congestion or discharge of excess mucus or purulent secretions, ear ache,   fever, chills, sweats, unintended wt loss or wt gain, classically pleuritic or exertional cp,  orthopnea pnd or arm/hand swelling  or leg swelling, presyncope, palpitations, abdominal pain, anorexia, nausea, vomiting, diarrhea  or change in bowel habits or change in bladder habits, change in stools or change in urine, dysuria, hematuria,  rash, arthralgias,  visual complaints, headache, numbness, weakness or ataxia or problems with walking or coordination,  change in mood or  memory. ?      ?   ? ?Past Medical History:  ?Diagnosis Date  ? Acute respiratory failure with hypoxia ([place]) 12/22/2018  ? Tobacco dependence   ? ? ?Outpatient Medications Prior to Visit  ?Medication Sig Dispense Refill  ? aspirin EC 81 MG tablet Take 1 tablet (81 mg total) by mouth daily. 90 tablet 3  ? diltiazem (CARTIA XT) 300 MG 24 hr capsule TAKE 1 CAPSULE (300 MG TOTAL) BY MOUTH DAILY. 90 capsule 0  ? rosuvastatin (CRESTOR) 20 MG tablet TAKE 1 TABLET (20 MG TOTAL) BY MOUTH DAILY. 90 tablet 0  ? albuterol (PROVENTIL) (2.5 MG/3ML) 0.083% nebulizer solution TAKE 3 MLS (2.5 MG TOTAL) BY NEBULIZATION EVERY 6 (SIX) HOURS AS NEEDED FOR WHEEZING OR SHORTNESS OF BREATH. 75 mL 2  ? albuterol (VENTOLIN HFA) 108 (90 Base) MCG/ACT inhaler INHALE 1-2 PUFFS INTO THE LUNGS EVERY 6 (SIX) HOURS AS NEEDED FOR WHEEZING OR SHORTNESS OF BREATH. 18 g 0  ? tiotropium (SPIRIVA) 18 MCG inhalation capsule PLACE 1 CAPSULE (18 MCG TOTAL) INTO INHALER AND INHALE DAILY. 30 capsule 6  ? ?No facility-administered medications prior to visit.  ? ? ? ?Objective:  ?  ? ?BP 136/70 (BP Location: Right Arm, Patient Position: Sitting)   Pulse 76   Temp 98.2 ?F (36.8 ?C) (Temporal)   Ht 5\' 6"  (1.676 m)   Wt 137 lb (62.1  kg)   SpO2 97% Comment: ra  BMI 22.11 kg/m?  ? ?SpO2: 97 % (ra) ? ? ?HEENT : pt wearing mask not removed for exam due to covid - 19 concerns.  ? ? ?NECK :  without JVD/Nodes/TM/ nl carotid upstrokes bilaterally ? ? ?LUNGS: no acc muscle use,  Mild barrel  contour chest wall with bilateral  Distant bs s audible wheeze and  without cough on insp or exp maneuvers  and mild  Hyperresonant  to  percussion bilaterally   ? ? ?CV:  RRR  no s3 or murmur or increase in P2, and no edema  ? ?ABD:  soft and nontender with pos end  insp Hoover's  in the supine position. No bruits or organomegaly appreciated, bowel sounds  nl ? ?MS:   Nl gait/  ext warm without deformities, calf tenderness, cyanosis or clubbing ?No obvious joint restrictions  ? ?SKIN: warm and dry without lesions   ? ?NEURO:  alert, approp, nl sensorium with  no motor or cerebellar deficits apparent.  ?    ? ? ? ? ?   ?Assessment  ? ?No problem-specific Assessment & Plan notes found for this encounter. ? ? ? ? ?Christinia Gully, MD ?11/01/2021 ?  ? ?

## 2021-11-02 ENCOUNTER — Other Ambulatory Visit: Payer: Self-pay

## 2021-11-02 ENCOUNTER — Encounter: Payer: Self-pay | Admitting: Internal Medicine

## 2021-11-02 NOTE — Assessment & Plan Note (Signed)
Counseled re importance of smoking cessation but did not meet time criteria for separate billing   ? ?    ?  ?Each maintenance medication was reviewed in detail including emphasizing most importantly the difference between maintenance and prns and under what circumstances the prns are to be triggered using an action plan format where appropriate. ? ?Total time for H and P, chart review, counseling, reviewing dpi/hfa/neb device(s) , directly observing portions of ambulatory 02 saturation study/ and generating customized AVS unique to this office visit / same day charting = 36 min ?     ?  ?      ?

## 2021-11-02 NOTE — Assessment & Plan Note (Addendum)
Active smoking  ?- 11/01/2021  After extensive coaching inhaler device,  effectiveness =  75% > continue spiriva dpi and prn saba  ?- 11/01/2021   Walked on RA  x  3  lap(s) =  approx 450  ft  @ mod pace, stopped due to end of study, sob on 3rd lap with lowest 02 sats 94%  ? ?Clinically Pt is Group B in terms of symptom/risk and laba/lama therefore appropriate rx at this point >>>  stiolto best choice but no funds and medicare starting in a few months so ok to wait until other options besides spiriva dpi are available > f/u in 3 m with pfts  ? ?In meantime advised: ?Re SABA :  I spent extra time with pt today reviewing appropriate use of albuterol for prn use on exertion with the following points: ?1) saba is for relief of sob that does not improve by walking a slower pace or resting but rather if the pt does not improve after trying this first. ?2) If the pt is convinced, as many are, that saba helps recover from activity faster then it's easy to tell if this is the case by re-challenging : ie stop, take the inhaler, then p 5 minutes try the exact same activity (intensity of workload) that just caused the symptoms and see if they are substantially diminished or not after saba ?3) if there is an activity that reproducibly causes the symptoms, try the saba 15 min before the activity on alternate days  ? ?If in fact the saba really does help, then fine to continue to use it prn but advised may need to look closer at the maintenance regimen being used to achieve better control of airways disease with exertion.   ? ?  ?

## 2021-11-09 ENCOUNTER — Encounter: Payer: Self-pay | Admitting: Dermatopathology

## 2021-11-09 ENCOUNTER — Other Ambulatory Visit: Payer: Self-pay

## 2021-11-09 ENCOUNTER — Other Ambulatory Visit: Payer: Self-pay | Admitting: Cardiovascular Disease

## 2021-11-09 DIAGNOSIS — I739 Peripheral vascular disease, unspecified: Secondary | ICD-10-CM

## 2021-11-10 ENCOUNTER — Other Ambulatory Visit: Payer: Self-pay | Admitting: Pharmacist

## 2021-11-10 ENCOUNTER — Other Ambulatory Visit: Payer: Self-pay

## 2021-11-10 MED ORDER — ALBUTEROL SULFATE (2.5 MG/3ML) 0.083% IN NEBU
3.0000 mL | INHALATION_SOLUTION | Freq: Four times a day (QID) | RESPIRATORY_TRACT | 2 refills | Status: DC | PRN
  Filled 2021-11-10: qty 90, 8d supply, fill #0

## 2021-11-21 ENCOUNTER — Ambulatory Visit: Payer: Medicaid Other | Admitting: Cardiovascular Disease

## 2021-11-22 ENCOUNTER — Telehealth: Payer: Self-pay | Admitting: Cardiovascular Disease

## 2021-11-22 NOTE — Telephone Encounter (Signed)
Patient wanted to know if her appointment 12/05/21 could be done as a virtual visit. The patient lives in [place] and has a hard time finding a ride to [place].  ? ?Please let the patient know what the office decides  ?

## 2021-11-23 NOTE — Telephone Encounter (Signed)
A virtual video visit is fine. ?

## 2021-11-24 NOTE — Telephone Encounter (Signed)
Left a message for the patient that the visit has been changed to a MyChart Video visit and to call if anything was needed.  ?

## 2021-11-27 ENCOUNTER — Encounter (HOSPITAL_BASED_OUTPATIENT_CLINIC_OR_DEPARTMENT_OTHER): Payer: Self-pay

## 2021-11-28 ENCOUNTER — Encounter (HOSPITAL_COMMUNITY): Payer: Self-pay

## 2021-11-28 ENCOUNTER — Ambulatory Visit (HOSPITAL_COMMUNITY)
Admission: RE | Admit: 2021-11-28 | Payer: Self-pay | Source: Ambulatory Visit | Attending: Cardiovascular Disease | Admitting: Cardiovascular Disease

## 2021-11-28 ENCOUNTER — Telehealth: Payer: Self-pay | Admitting: Cardiovascular Disease

## 2021-11-28 ENCOUNTER — Encounter (HOSPITAL_BASED_OUTPATIENT_CLINIC_OR_DEPARTMENT_OTHER): Payer: Self-pay

## 2021-11-28 DIAGNOSIS — I739 Peripheral vascular disease, unspecified: Secondary | ICD-10-CM

## 2021-11-28 NOTE — Telephone Encounter (Signed)
Patient's daughter called and cancelled Lower Extremity Arterial scheduled for this afternoon, 4/11 due to having car issues. She initially requested to reschedule for May, but the order expires on 4/14. A new order is needed prior to rescheduling. Virtual appointment with Dr. Fletcher Anon will also need to be rescheduled once order is placed. ?

## 2021-11-28 NOTE — Telephone Encounter (Signed)
Ordered on 10/03/21: VAS [place] LOWER EXTREMITY ARTERIAL DUPLEX  ?Ordered on 11/09/21: LE ART SEG MULTI (Segm & LE Reynauds) ?These were ordered for [facility] ? ?Will send to [facility] to assist with new appointments and to scheduling to arrange f/u after testing ? ?

## 2021-12-01 NOTE — Telephone Encounter (Signed)
LEA order has been placed.   ?

## 2021-12-04 NOTE — Telephone Encounter (Signed)
Scheduled 12/21/21

## 2021-12-05 ENCOUNTER — Telehealth: Payer: Medicaid Other | Admitting: Cardiovascular Disease

## 2021-12-05 ENCOUNTER — Other Ambulatory Visit: Payer: Self-pay

## 2021-12-12 ENCOUNTER — Other Ambulatory Visit: Payer: Self-pay | Admitting: Cardiovascular Disease

## 2021-12-12 DIAGNOSIS — I739 Peripheral vascular disease, unspecified: Secondary | ICD-10-CM

## 2021-12-21 ENCOUNTER — Ambulatory Visit (HOSPITAL_COMMUNITY)
Admission: RE | Admit: 2021-12-21 | Discharge: 2021-12-21 | Disposition: A | Discharge status: Home / Self Care | Payer: Self-pay | Source: Ambulatory Visit | Attending: Cardiovascular Disease | Admitting: Cardiovascular Disease

## 2021-12-21 DIAGNOSIS — I739 Peripheral vascular disease, unspecified: Secondary | ICD-10-CM

## 2021-12-26 ENCOUNTER — Encounter: Payer: Self-pay | Admitting: Cardiovascular Disease

## 2021-12-26 ENCOUNTER — Telehealth (INDEPENDENT_AMBULATORY_CARE_PROVIDER_SITE_OTHER): Payer: Self-pay | Admitting: Cardiovascular Disease

## 2021-12-26 ENCOUNTER — Telehealth: Payer: Self-pay

## 2021-12-26 DIAGNOSIS — I480 Paroxysmal atrial fibrillation: Secondary | ICD-10-CM

## 2021-12-26 DIAGNOSIS — E785 Hyperlipidemia, unspecified: Secondary | ICD-10-CM

## 2021-12-26 DIAGNOSIS — I739 Peripheral vascular disease, unspecified: Secondary | ICD-10-CM

## 2021-12-26 DIAGNOSIS — I771 Stricture of artery: Secondary | ICD-10-CM

## 2021-12-26 NOTE — Patient Instructions (Signed)
Medication Instructions:  No changes *If you need a refill on your cardiac medications before your next appointment, please call your pharmacy*   Lab Work: None ordered If you have labs (blood work) drawn today and your tests are completely normal, you will receive your results only by: MyChart Message (if you have MyChart) OR A paper copy in the mail If you have any lab test that is abnormal or we need to change your treatment, we will call you to review the results.   Testing/Procedures: None ordered   Follow-Up: At [facility], you and your health needs are our priority.  As part of our continuing mission to provide you with exceptional heart care, we have created designated Provider Care Teams.  These Care Teams include your primary Cardiologist (physician) and Advanced Practice Providers (APPs -  Physician Assistants and Nurse Practitioners) who all work together to provide you with the care you need, when you need it.  We recommend signing up for the patient portal called "MyChart".  Sign up information is provided on this After Visit Summary.  MyChart is used to connect with patients for Virtual Visits (Telemedicine).  Patients are able to view lab/test results, encounter notes, upcoming appointments, etc.  Non-urgent messages can be sent to your provider as well.   To learn more about what you can do with MyChart, go to [email or web address].    Your next appointment:   6 month(s)  The format for your next appointment:   In Person  Provider:   Dr. Arida  Important Information About Sugar       

## 2021-12-26 NOTE — Progress Notes (Signed)
? ?Virtual Visit via Video Note  ? ?This visit type was conducted due to national recommendations for restrictions regarding the COVID-19 Pandemic (e.g. social distancing) in an effort to limit this patient's exposure and mitigate transmission in our community.  Due to her co-morbid illnesses, this patient is at least at moderate risk for complications without adequate follow up.  This format is felt to be most appropriate for this patient at this time.  All issues noted in this document were discussed and addressed.  A limited physical exam was performed with this format.  Please refer to the patient's chart for her consent to telehealth for [facility]. ? ?   ? ? ?Date:  12/26/2021  ? ?ID:  Carrie Case, DOB November 12, 1955, MRN [id number] ?The patient was identified using 2 identifiers. ? ?Patient Location: Home ?Provider Location: Office/Clinic ? ? ?PCP:  Elsie Stain, MD ?  ?[facility] Providers ?Cardiologist:  Larae Grooms, MD    ? ?Evaluation Performed:  Follow-Up Visit ? ?Chief Complaint: Shortness of breath ? ?History of Present Illness:   ? ?Carrie Case is a 66 y.o. female who was seen via video visit for a follow-up visit regarding peripheral arterial disease and asymptomatic left subclavian artery stenosis. ?She has known history of paroxysmal atrial fibrillation, prolonged history of tobacco use and hyperlipidemia. ?She has been smoking for 50 years. ?She had carotid artery duplex done in October 2022 which showed moderate left carotid stenosis.  There was incidental finding of left subclavian artery stenosis with bidirectional flow in the left vertebral artery and 32 mm blood pressure difference between the right arm and the left arm.  In spite of that, the patient denies any left arm claudication and has no neurologic symptoms with using the left arm. ?During her initial evaluation, she did complain of right calf claudication.  Thus, she underwent lower extremity arterial Doppler  studies which showed mildly reduced ABI on the right side with evidence of right popliteal artery occlusion. ?She reports that her light calf claudication is stable and not severe overall.  No lower extremity ulceration.  No left arm discomfort.  She denies chest pain.  She has chronic exertional dyspnea related to COPD. ? ? ?The patient does not have symptoms concerning for COVID-19 infection (fever, chills, cough, or new shortness of breath).  ? ? ?Past Medical History:  ?Diagnosis Date  ? Acute respiratory failure with hypoxia ([place]) 12/22/2018  ? Tobacco dependence   ? ?Past Surgical History:  ?Procedure Laterality Date  ? TUBAL LIGATION    ?  ? ?Current Meds  ?Medication Sig  ? albuterol (PROVENTIL) (2.5 MG/3ML) 0.083% nebulizer solution TAKE 3 MLS (2.5 MG TOTAL) BY NEBULIZATION EVERY 6 (SIX) HOURS AS NEEDED FOR WHEEZING OR SHORTNESS OF BREATH.  ? albuterol (VENTOLIN HFA) 108 (90 Base) MCG/ACT inhaler INHALE 1-2 PUFFS INTO THE LUNGS EVERY 6 (SIX) HOURS AS NEEDED FOR WHEEZING OR SHORTNESS OF BREATH.  ? aspirin EC 81 MG tablet Take 1 tablet (81 mg total) by mouth daily.  ? diltiazem (CARTIA XT) 300 MG 24 hr capsule TAKE 1 CAPSULE (300 MG TOTAL) BY MOUTH DAILY.  ? rosuvastatin (CRESTOR) 20 MG tablet TAKE 1 TABLET (20 MG TOTAL) BY MOUTH DAILY.  ?  ? ?Allergies:   Ibuprofen  ? ?Social History  ? ?Tobacco Use  ? Smoking status: Every Day  ?  Packs/day: 0.50  ?  Years: 47.00  ?  Pack years: 23.50  ?  Types: Cigarettes  ? Smokeless  tobacco: Never  ? Tobacco comments:  ?  Smoking about 1/2 ppd per patient on 11/01/2021  ?Vaping Use  ? Vaping Use: Never used  ?Substance Use Topics  ? Alcohol use: No  ? Drug use: No  ?  ? ?Family Hx: ?The patient's family history includes Anxiety disorder in her mother; COPD in her mother; Cancer in her mother; Hyperlipidemia in her mother; Hypertension in her mother. ? ?ROS:   ?Please see the history of present illness.    ? ?All other systems reviewed and are negative. ? ? ?Prior CV  studies:   ?The following studies were reviewed today: ? ?I reviewed the results of recent lower extremity arterial Doppler with her. ? ?Labs/Other Tests and Data Reviewed:   ? ?EKG:  No ECG reviewed. ? ?Recent Labs: ?No results found for requested labs within last 8760 hours.  ? ?Recent Lipid Panel ?Lab Results  ?Component Value Date/Time  ? CHOL 125 12/23/2018 06:33 AM  ? TRIG 190 (H) 12/23/2018 06:33 AM  ? HDL 26 (L) 12/23/2018 06:33 AM  ? CHOLHDL 4.8 12/23/2018 06:33 AM  ? LDLCALC 61 12/23/2018 06:33 AM  ? ? ?Wt Readings from Last 3 Encounters:  ?11/01/21 137 lb (62.1 kg)  ?08/29/21 139 lb (63 kg)  ?08/23/21 139 lb (63 kg)  ?  ? ?Risk Assessment/Calculations:   ?  ? ?    ?Objective:   ? ?Vital Signs:  There were no vitals taken for this visit.  ? ?VITAL SIGNS:  reviewed ?GEN:  no acute distress ?EYES:  sclerae anicteric, EOMI - Extraocular Movements Intact ?RESPIRATORY:  normal respiratory effort, symmetric expansion ?CARDIOVASCULAR:  no peripheral edema ?SKIN:  no rash, lesions or ulcers. ?MUSCULOSKELETAL:  no obvious deformities. ?NEURO:  alert and oriented x 3, no obvious focal deficit ?PSYCH:  normal affect ? ?ASSESSMENT & PLAN:   ? ?1.  Left subclavian artery stenosis: The patient has evidence of left subclavian artery stenosis.  She continues to deny left arm claudication.  Recommend medical therapy.  Recommend checking blood pressure in the right arm and not the left.  ? ?2.  Peripheral arterial disease with claudication worse on the right side.  Mild to moderate right calf claudication due to an occluded popliteal artery.  No indication for revascularization at the present time.  I discussed with her the importance of a walking exercise program and also the importance of smoking cessation.  ?  ?3.  Tobacco use: I again discussed the importance of smoking cessation.  She cut down to half a pack per day and is trying to gradually decrease. ?  ?4.  Paroxysmal atrial fibrillation: She seems to be in sinus  rhythm. ?  ?5.  Hyperlipidemia: Continue treatment with rosuvastatin with a target LDL of less than 70. ?  ? ? ? ?Time:   ?Today, I have spent 12 minutes with the patient with telehealth technology discussing the above problems.   ? ? ?Medication Adjustments/Labs and Tests Ordered: ?Current medicines are reviewed at length with the patient today.  Concerns regarding medicines are outlined above.  ? ?Tests Ordered: ?No orders of the defined types were placed in this encounter. ? ? ?Medication Changes: ?No orders of the defined types were placed in this encounter. ? ? ?Follow Up:  In Person in 6 month(s) ? ?Signed, ?Kathlyn Sacramento, MD  ?12/26/2021 12:43 PM    ?[place] ? ?

## 2021-12-26 NOTE — Telephone Encounter (Signed)
?  Patient Consent for Virtual Visit  ? ? ?   ? ?Carrie Case has provided verbal consent on 12/26/2021 for a virtual visit (video or telephone). ? ? ?CONSENT FOR VIRTUAL VISIT FOR:  Carrie Case  ?By participating in this virtual visit I agree to the following: ? ?I hereby voluntarily request, consent and authorize [facility] and its employed or contracted physicians, physician assistants, nurse practitioners or other licensed health care professionals (the Practitioner), to provide me with telemedicine health care services (the ?Services") as deemed necessary by the treating Practitioner. I acknowledge and consent to receive the Services by the Practitioner via telemedicine. I understand that the telemedicine visit will involve communicating with the Practitioner through live audiovisual communication technology and the disclosure of certain medical information by electronic transmission. I acknowledge that I have been given the opportunity to request an in-person assessment or other available alternative prior to the telemedicine visit and am voluntarily participating in the telemedicine visit. ? ?I understand that I have the right to withhold or withdraw my consent to the use of telemedicine in the course of my care at any time, without affecting my right to future care or treatment, and that the Practitioner or I may terminate the telemedicine visit at any time. I understand that I have the right to inspect all information obtained and/or recorded in the course of the telemedicine visit and may receive copies of available information for a reasonable fee.  I understand that some of the potential risks of receiving the Services via telemedicine include:  ?Delay or interruption in medical evaluation due to technological equipment failure or disruption; ?Information transmitted may not be sufficient (e.g. poor resolution of images) to allow for appropriate medical decision making by the Practitioner;  and/or  ?In rare instances, security protocols could fail, causing a breach of personal health information. ? ?Furthermore, I acknowledge that it is my responsibility to provide information about my medical history, conditions and care that is complete and accurate to the best of my ability. I acknowledge that Practitioner's advice, recommendations, and/or decision may be based on factors not within their control, such as incomplete or inaccurate data provided by me or distortions of diagnostic images or specimens that may result from electronic transmissions. I understand that the practice of medicine is not an exact science and that Practitioner makes no warranties or guarantees regarding treatment outcomes. I acknowledge that a copy of this consent can be made available to me via my patient portal [facility] MyChart), or I can request a printed copy by calling the office of [facility].   ? ?I understand that my insurance will be billed for this visit.  ? ?I have read or had this consent read to me. ?I understand the contents of this consent, which adequately explains the benefits and risks of the Services being provided via telemedicine.  ?I have been provided ample opportunity to ask questions regarding this consent and the Services and have had my questions answered to my satisfaction. ?I give my informed consent for the services to be provided through the use of telemedicine in my medical care ? ?  ?

## 2021-12-27 ENCOUNTER — Telehealth: Payer: Self-pay | Admitting: Licensed Clinical Social Worker

## 2021-12-29 NOTE — Progress Notes (Signed)
?Heart and Vascular Care Navigation ? ?12/29/2021 ? ?Carrie Case ?[birth year] ?[id number] ? ?Reason for Referral:  ? Uninsured, almost eligible for Medicare ?Engaged with patient by telephone for initial visit for Heart and Vascular Care Coordination. ?                                                                                                  ?Assessment:      ?LCSW called and spoke with pt. Introduced self, role, reason for call. Pt confirmed home address, PCP, and that she utilizes [facility] to obtain her medications- she shares that due to uninsured discount she is able to afford everything. She confirmed emergency contacts, lives next door to her daughters, was watching grandbaby while this Clinical research associate completed assessment. She has been able to make ends meet so far using her widows pension benefits- but we discussed assistance options should she find herself in a bind. She is eligible for Medicare enrollment next month- I shared with her that [facility] counselors would be available to assist with ensuring she enrolls in the best coverage for her. Pt agreeable. Very appreciative for the check in call.                      ? ?HRT/VAS Care Coordination   ? ? Patients Home Cardiology Office [facility]  ? Outpatient Care Team Social Worker  ? Social Worker Name: Esmeralda [facility] [phone]  ? Living arrangements for the past 2 months Apartment  ? Lives with: Self  ? Patient Current Insurance Coverage Self-Pay  ? Patient Has Concern With Paying Medical Bills Yes  ? Patient Concerns With Medical Bills no current insurance, will be able to enroll in Medicare next month  ? Medical Bill Referrals: [facility]  ? DME Agency NA  ? HH Agency NA  ? ?  ? ? ?Social History:                                                                             ?SDOH Screenings  ? ?Alcohol Screen: Not on file  ?Depression (PHQ2-9): Not on file  ?Financial Resource Strain: Medium Risk  ? Difficulty of Paying Living  Expenses: Somewhat hard  ?Food Insecurity: No Food Insecurity  ? Worried About Programme researcher, broadcasting/film/video in the Last Year: Never true  ? Ran Out of Food in the Last Year: Never true  ?Housing: Low Risk   ? Last Housing Risk Score: 0  ?Physical Activity: Not on file  ?Social Connections: Not on file  ?Stress: Not on file  ?Tobacco Use: High Risk  ? Smoking Tobacco Use: Every Day  ? Smokeless Tobacco Use: Never  ? Passive Exposure: Not on file  ?Transportation Needs: No Transportation Needs  ? Lack of Transportation (Medical): No  ?  Lack of Transportation (Non-Medical): No  ? ? ?SDOH Interventions: ?Financial Resources:  Financial Strain Interventions: Other (Comment) [facility] counselor) ?  ?Food Insecurity:  Food Insecurity Interventions: Intervention Not Indicated  ?Housing Insecurity:  Housing Interventions: Intervention Not Indicated  ?Transportation:   Transportation Interventions: Intervention Not Indicated  ? ? ?Follow-up plan:   ?LCSW mailed pt information about [facility] counseling and my card, I encouraged her to f/u as needed should any additional questions/concerns arise. ? ? ? ?

## 2022-01-02 ENCOUNTER — Other Ambulatory Visit: Payer: Self-pay

## 2022-01-26 ENCOUNTER — Other Ambulatory Visit: Payer: Self-pay | Admitting: Critical Care Medicine

## 2022-01-26 ENCOUNTER — Other Ambulatory Visit: Payer: Self-pay

## 2022-01-26 DIAGNOSIS — I4891 Unspecified atrial fibrillation: Secondary | ICD-10-CM

## 2022-01-26 DIAGNOSIS — I6523 Occlusion and stenosis of bilateral carotid arteries: Secondary | ICD-10-CM

## 2022-01-26 NOTE — Telephone Encounter (Signed)
Requested medications are due for refill today.  unsure  Requested medications are on the active medications list.  yes  Last refill. Both refilled 10/06/2021 #90 0 refills  Future visit scheduled.   no  Notes to clinic.  Medication refill failed protocol d/t expired labs.    Requested Prescriptions  Pending Prescriptions Disp Refills   diltiazem (CARTIA XT) 300 MG 24 hr capsule 90 capsule 0    Sig: TAKE 1 CAPSULE (300 MG TOTAL) BY MOUTH DAILY.     Cardiovascular: Calcium Channel Blockers 3 Failed - 01/26/2022  1:42 PM      Failed - ALT in normal range and within 360 days    ALT  Date Value Ref Range Status  01/13/2019 11 0 - 32 IU/L Final         Failed - AST in normal range and within 360 days    AST  Date Value Ref Range Status  01/13/2019 11 0 - 40 IU/L Final         Failed - Cr in normal range and within 360 days    Creatinine, Ser  Date Value Ref Range Status  05/11/2020 0.68 0.57 - 1.00 mg/dL Final         Failed - Valid encounter within last 6 months    Recent Outpatient Visits           7 months ago Achilles tendinitis of right lower extremity   [facility] [place], [place], [place]   1 year ago Left wristdrop   [facility] Storm Frisk, MD   1 year ago Atrial fibrillation with RVR [facility])   [facility] Storm Frisk, MD   1 year ago Recurrent falls   [place] [facility] Fulp, McDonald, MD   1 year ago Balance problem    [facility] Hoy Register, MD       Future Appointments             In 1 month Wert, Charlaine Dalton, MD [facility]            Passed - Last BP in normal range    BP Readings from Last 1 Encounters:  11/01/21 136/70         Passed - Last Heart Rate in normal range    Pulse Readings from Last 1 Encounters:  11/01/21 76          rosuvastatin (CRESTOR) 20 MG tablet 90 tablet  0    Sig: TAKE 1 TABLET (20 MG TOTAL) BY MOUTH DAILY.     Cardiovascular:  Antilipid - Statins 2 Failed - 01/26/2022  1:42 PM      Failed - Cr in normal range and within 360 days    Creatinine, Ser  Date Value Ref Range Status  05/11/2020 0.68 0.57 - 1.00 mg/dL Final         Failed - Lipid Panel in normal range within the last 12 months    Cholesterol  Date Value Ref Range Status  12/23/2018 125 0 - 200 mg/dL Final   LDL Cholesterol  Date Value Ref Range Status  12/23/2018 61 0 - 99 mg/dL Final    Comment:           Total Cholesterol/HDL:CHD Risk Coronary Heart Disease Risk Table  Men   Women  1/2 Average Risk   3.4   3.3  Average Risk       5.0   4.4  2 X Average Risk   9.6   7.1  3 X Average Risk  23.4   11.0        Use the calculated Patient Ratio above and the CHD Risk Table to determine the patient's CHD Risk.        ATP III CLASSIFICATION (LDL):  <100     mg/dL   Optimal  341-937  mg/dL   Near or Above                    Optimal  130-159  mg/dL   Borderline  902-409  mg/dL   High  >735     mg/dL   Very High Performed at [facility], [address], [place], [place] [address]    HDL  Date Value Ref Range Status  12/23/2018 26 (L) >40 mg/dL Final   Triglycerides  Date Value Ref Range Status  12/23/2018 190 (H) <150 mg/dL Final         Passed - Patient is not pregnant      Passed - Valid encounter within last 12 months    Recent Outpatient Visits           7 months ago Achilles tendinitis of right lower extremity   [facility] [place], [place], [place]   1 year ago Left wristdrop   [facility] Storm Frisk, MD   1 year ago Atrial fibrillation with RVR [facility])   [facility] Storm Frisk, MD   1 year ago Recurrent falls   [place] [facility] Fulp, Dougherty, MD   1 year ago Balance problem   [place]  [facility] Hoy Register, MD       Future Appointments             In 1 month Wert, Charlaine Dalton, MD [facility]

## 2022-02-03 ENCOUNTER — Other Ambulatory Visit: Payer: Self-pay | Admitting: Critical Care Medicine

## 2022-02-03 DIAGNOSIS — I4891 Unspecified atrial fibrillation: Secondary | ICD-10-CM

## 2022-02-03 DIAGNOSIS — I6523 Occlusion and stenosis of bilateral carotid arteries: Secondary | ICD-10-CM

## 2022-02-06 ENCOUNTER — Other Ambulatory Visit (HOSPITAL_COMMUNITY): Payer: Self-pay

## 2022-02-06 ENCOUNTER — Other Ambulatory Visit: Payer: Self-pay

## 2022-02-07 ENCOUNTER — Other Ambulatory Visit: Payer: Self-pay

## 2022-02-07 ENCOUNTER — Ambulatory Visit: Payer: Medicaid Other | Admitting: Internal Medicine

## 2022-02-14 ENCOUNTER — Other Ambulatory Visit: Payer: Self-pay

## 2022-02-14 ENCOUNTER — Encounter: Payer: Self-pay | Admitting: Physician Assistant

## 2022-02-14 ENCOUNTER — Ambulatory Visit: Payer: Medicaid Other | Attending: Physician Assistant | Admitting: Physician Assistant

## 2022-02-14 VITALS — BP 138/77 | HR 61 | Wt 132.4 lb

## 2022-02-14 DIAGNOSIS — J449 Chronic obstructive pulmonary disease, unspecified: Secondary | ICD-10-CM

## 2022-02-14 DIAGNOSIS — J42 Unspecified chronic bronchitis: Secondary | ICD-10-CM | POA: Insufficient documentation

## 2022-02-14 DIAGNOSIS — Z8673 Personal history of transient ischemic attack (TIA), and cerebral infarction without residual deficits: Secondary | ICD-10-CM | POA: Insufficient documentation

## 2022-02-14 DIAGNOSIS — I6523 Occlusion and stenosis of bilateral carotid arteries: Secondary | ICD-10-CM

## 2022-02-14 DIAGNOSIS — I639 Cerebral infarction, unspecified: Secondary | ICD-10-CM

## 2022-02-14 DIAGNOSIS — F17210 Nicotine dependence, cigarettes, uncomplicated: Secondary | ICD-10-CM | POA: Insufficient documentation

## 2022-02-14 DIAGNOSIS — R5383 Other fatigue: Secondary | ICD-10-CM

## 2022-02-14 DIAGNOSIS — I4891 Unspecified atrial fibrillation: Secondary | ICD-10-CM

## 2022-02-14 DIAGNOSIS — R140 Abdominal distension (gaseous): Secondary | ICD-10-CM

## 2022-02-14 MED ORDER — ROSUVASTATIN CALCIUM 20 MG PO TABS
ORAL_TABLET | Freq: Every day | ORAL | 1 refills | Status: DC
Start: 2022-02-14 — End: 2022-07-16
  Filled 2022-02-14: qty 30, 30d supply, fill #0
  Filled 2022-03-12: qty 30, 30d supply, fill #1
  Filled 2022-04-11: qty 30, 30d supply, fill #2
  Filled 2022-05-11: qty 30, 30d supply, fill #3
  Filled 2022-06-10: qty 30, 30d supply, fill #4
  Filled 2022-07-09: qty 30, 30d supply, fill #5

## 2022-02-14 MED ORDER — DILTIAZEM HCL ER COATED BEADS 300 MG PO CP24
ORAL_CAPSULE | Freq: Every day | ORAL | 1 refills | Status: DC
Start: 2022-02-14 — End: 2022-07-16
  Filled 2022-02-14: qty 30, 30d supply, fill #0
  Filled 2022-03-12: qty 30, 30d supply, fill #1
  Filled 2022-04-11: qty 30, 30d supply, fill #2
  Filled 2022-05-11: qty 30, 30d supply, fill #3
  Filled 2022-06-10: qty 30, 30d supply, fill #4
  Filled 2022-07-09: qty 30, 30d supply, fill #5

## 2022-02-14 MED ORDER — ALBUTEROL SULFATE HFA 108 (90 BASE) MCG/ACT IN AERS
1.0000 | INHALATION_SPRAY | Freq: Four times a day (QID) | RESPIRATORY_TRACT | 1 refills | Status: DC | PRN
  Filled 2022-02-14: qty 18, 25d supply, fill #0

## 2022-02-14 NOTE — Progress Notes (Signed)
Patient ID: Carrie Case, female   DOB: [birth year], 66 y.o.   MRN: [id number]   Carrie Case, is a 66 y.o. female  [id number]  [id number]  DOB - 28-Sep-1955  Chief Complaint  Patient presents with   Medication Refill       Subjective:   Carrie Case is a 66 y.o. female here today for several issues.  She is here with her daughter.  Needs RF on all meds.  Needs forms filled out for patient assistance on spiriva.    Not out of any meds currently but only has a few of each left.  No CP/HA/dizziness.  Down to smoking 1/2ppd.  PFT scheduled in a couple weeks and appt with Dr Sherene Sires in July as well.    Also c/o feeling bloated and gets full after eating quickly.  She has lost a few pounds.  She denies fevers.  Does NOT want colonoscopy or MMG-ever.  No blood in stool.  No diarrhea.  No abdominal pain No problems updated.  ALLERGIES: Allergies  Allergen Reactions   Ibuprofen Hives    PAST MEDICAL HISTORY: Past Medical History:  Diagnosis Date   Acute respiratory failure with hypoxia ([facility]) 12/22/2018   Tobacco dependence     MEDICATIONS AT HOME: Prior to Admission medications   Medication Sig Start Date End Date Taking? Authorizing Provider  albuterol (PROVENTIL) (2.5 MG/3ML) 0.083% nebulizer solution TAKE 3 MLS (2.5 MG TOTAL) BY NEBULIZATION EVERY 6 (SIX) HOURS AS NEEDED FOR WHEEZING OR SHORTNESS OF BREATH. 11/10/21 11/10/22  Storm Frisk, MD  albuterol (VENTOLIN HFA) 108 (90 Base) MCG/ACT inhaler INHALE 1-2 PUFFS INTO THE LUNGS EVERY 6 (SIX) HOURS AS NEEDED FOR WHEEZING OR SHORTNESS OF BREATH. 02/14/22 04/15/22  Anders Simmonds, PA-C  aspirin EC 81 MG tablet Take 1 tablet (81 mg total) by mouth daily. 01/08/19   Corky Crafts, MD  diltiazem (CARTIA XT) 300 MG 24 hr capsule TAKE 1 CAPSULE (300 MG TOTAL) BY MOUTH DAILY. 02/14/22 02/14/23  Anders Simmonds, PA-C  rosuvastatin (CRESTOR) 20 MG tablet TAKE 1 TABLET (20 MG TOTAL) BY MOUTH DAILY. 02/14/22 02/14/23  Anders Simmonds, PA-C  tiotropium (SPIRIVA) 18 MCG inhalation capsule PLACE 1 CAPSULE (18 MCG TOTAL) INTO INHALER AND INHALE DAILY. 10/26/20 10/26/21  Storm Frisk, MD    ROS: Neg HEENT Neg GU Neg MS Neg psych Neg neuro  Objective:   Vitals:   02/14/22 1614  BP: 138/77  Pulse: 61  SpO2: 96%  Weight: 132 lb 6.4 oz (60.1 kg)   Exam General appearance : Awake, alert, not in any distress. Speech Clear. Not toxic looking HEENT: Atraumatic and Normocephalic Neck: Supple, no JVD. No cervical lymphadenopathy.  Chest: Good air entry bilaterally, CTAB.  No rales/rhonchi/wheezing CVS: S1 S2 regular, no murmurs.  Extremities: B/L Lower Ext shows no edema, both legs are warm to touch Neurology: Awake alert, and oriented X 3, CN II-XII intact, Non focal Skin: No Rash  Data Review Lab Results  Component Value Date   HGBA1C 5.6 12/22/2018    Assessment & Plan   1. Atrial fibrillation with RVR (HCC) - CBC with Differential/Platelet - diltiazem (CARTIA XT) 300 MG 24 hr capsule; TAKE 1 CAPSULE (300 MG TOTAL) BY MOUTH DAILY.  Dispense: 90 capsule; Refill: 1  2. COPD with chronic bronchitis (HCC) Down to 1/2 ppd on cigs-continue to decrease She has forms for patient assistance that I signed for obtaining spiriva - albuterol (VENTOLIN HFA) 108 (90 Base) MCG/ACT  inhaler; INHALE 1-2 PUFFS INTO THE LUNGS EVERY 6 (SIX) HOURS AS NEEDED FOR WHEEZING OR SHORTNESS OF BREATH.  Dispense: 18 g; Refill: 1  3. Cerebrovascular accident (CVA), unspecified mechanism (HCC) - Lipid panel  4. Carotid atherosclerosis, bilateral - Lipid panel - rosuvastatin (CRESTOR) 20 MG tablet; TAKE 1 TABLET (20 MG TOTAL) BY MOUTH DAILY.  Dispense: 90 tablet; Refill: 1  5. Bilateral carotid artery stenosis - rosuvastatin (CRESTOR) 20 MG tablet; TAKE 1 TABLET (20 MG TOTAL) BY MOUTH DAILY.  Dispense: 90 tablet; Refill: 1  6. Bloating - Comprehensive metabolic panel - Lipase  7. Other fatigue - Comprehensive metabolic  panel - CBC with Differential/Platelet - Thyroid Panel With TSH    Return in about 4 months (around 06/16/2022) for PCP for chronic conditions.  The patient was given clear instructions to go to ER or return to medical center if symptoms don't improve, worsen or new problems develop. The patient verbalized understanding. The patient was told to call to get lab results if they haven't heard anything in the next week.      Georgian Co, PA-C [facility], [place] [phone]   02/14/2022, 4:42 PM

## 2022-02-15 ENCOUNTER — Telehealth: Payer: Self-pay | Admitting: Critical Care Medicine

## 2022-02-15 LAB — COMPREHENSIVE METABOLIC PANEL
ALT: 10 IU/L (ref 0–32)
AST: 12 IU/L (ref 0–40)
Albumin/Globulin Ratio: 1.3 (ref 1.2–2.2)
Albumin: 4.3 g/dL (ref 3.8–4.8)
Alkaline Phosphatase: 87 IU/L (ref 44–121)
BUN/Creatinine Ratio: 8 — ABNORMAL LOW (ref 12–28)
BUN: 6 mg/dL — ABNORMAL LOW (ref 8–27)
Bilirubin Total: 0.4 mg/dL (ref 0.0–1.2)
CO2: 24 mmol/L (ref 20–29)
Calcium: 10.1 mg/dL (ref 8.7–10.3)
Chloride: 99 mmol/L (ref 96–106)
Creatinine, Ser: 0.75 mg/dL (ref 0.57–1.00)
Globulin, Total: 3.4 g/dL (ref 1.5–4.5)
Glucose: 82 mg/dL (ref 70–99)
Potassium: 4 mmol/L (ref 3.5–5.2)
Sodium: 138 mmol/L (ref 134–144)
Total Protein: 7.7 g/dL (ref 6.0–8.5)
eGFR: 89 mL/min/{1.73_m2} (ref >59)

## 2022-02-15 LAB — CBC WITH DIFFERENTIAL/PLATELET
Basophils Absolute: 0.1 10*3/uL (ref 0.0–0.2)
Basos: 1 %
EOS (ABSOLUTE): 0.1 10*3/uL (ref 0.0–0.4)
Eos: 1 %
Hematocrit: 45.1 % (ref 34.0–46.6)
Hemoglobin: 15.5 g/dL (ref 11.1–15.9)
Immature Grans (Abs): 0 10*3/uL (ref 0.0–0.1)
Immature Granulocytes: 0 %
Lymphocytes Absolute: 3 10*3/uL (ref 0.7–3.1)
Lymphs: 34 %
MCH: 32.6 pg (ref 26.6–33.0)
MCHC: 34.4 g/dL (ref 31.5–35.7)
MCV: 95 fL (ref 79–97)
Monocytes Absolute: 0.8 10*3/uL (ref 0.1–0.9)
Monocytes: 9 %
Neutrophils Absolute: 4.7 10*3/uL (ref 1.4–7.0)
Neutrophils: 55 %
Platelets: 307 10*3/uL (ref 150–450)
RBC: 4.75 x10E6/uL (ref 3.77–5.28)
RDW: 13.7 % (ref 11.7–15.4)
WBC: 8.6 10*3/uL (ref 3.4–10.8)

## 2022-02-15 LAB — LIPID PANEL
Chol/HDL Ratio: 3.1 ratio (ref 0.0–4.4)
Cholesterol, Total: 145 mg/dL (ref 100–199)
HDL: 47 mg/dL (ref >39)
LDL Chol Calc (NIH): 80 mg/dL (ref 0–99)
Triglycerides: 94 mg/dL (ref 0–149)
VLDL Cholesterol Cal: 18 mg/dL (ref 5–40)

## 2022-02-15 LAB — THYROID PANEL WITH TSH
Free Thyroxine Index: 1.3 (ref 1.2–4.9)
T3 Uptake Ratio: 20 % — ABNORMAL LOW (ref 24–39)
T4, Total: 6.3 ug/dL (ref 4.5–12.0)
TSH: 3.29 u[IU]/mL (ref 0.450–4.500)

## 2022-02-15 LAB — LIPASE: Lipase: 13 U/L — ABNORMAL LOW (ref 14–72)

## 2022-02-15 NOTE — Telephone Encounter (Signed)
Copied from CRM [id number]. Topic: General - Other >> Feb 15, 2022  9:05 AM Turkey B wrote: Reason for [id number] caled states, was told at appt on 006/28 that Dr Laural Benes , would send her a muscle relaxer for the pain in her legs. She doesn't know the name of it. I dont see a record of this. Please call back

## 2022-02-16 NOTE — Telephone Encounter (Signed)
I looked through [facility] note didn't see anything about muscle relaxer, can you send it I for her?

## 2022-02-18 MED ORDER — CYCLOBENZAPRINE HCL 10 MG PO TABS
10.0000 mg | ORAL_TABLET | Freq: Three times a day (TID) | ORAL | 0 refills | Status: DC | PRN
  Filled 2022-02-18: qty 30, 10d supply, fill #0

## 2022-02-18 NOTE — Telephone Encounter (Signed)
Medication sent to our pharmacy

## 2022-02-19 ENCOUNTER — Other Ambulatory Visit: Payer: Self-pay

## 2022-02-19 NOTE — Telephone Encounter (Signed)
Called pt and she is aware of meds

## 2022-02-26 ENCOUNTER — Telehealth: Payer: Self-pay | Admitting: Emergency Medicine

## 2022-02-26 NOTE — Telephone Encounter (Signed)
Copied from CRM [id number]. Topic: General - Inquiry >> Feb 26, 2022  3:18 PM Pincus Sanes wrote: Reason for CRM: Pt called re a form [employer] Care Patient program a  form was to be faxed at the appt with Marylene Land on 6/28. She was to fax a form in order for pt to have discount on meds. It was faxed but did not have signature pg 3 was unfinished and pg 1 not done at all. Send refax [phone]. Please redo and refax and let her know to alert them when complete. Pt only has limited med left.  Address is [address] [place] [address]

## 2022-02-27 ENCOUNTER — Ambulatory Visit (HOSPITAL_COMMUNITY): Payer: Medicaid Other

## 2022-02-28 NOTE — Telephone Encounter (Signed)
Fax was resent and our portion was properly filled out

## 2022-02-28 NOTE — Telephone Encounter (Signed)
Called pt and is aware 

## 2022-03-07 ENCOUNTER — Ambulatory Visit: Payer: Medicaid Other | Admitting: Internal Medicine

## 2022-03-08 ENCOUNTER — Ambulatory Visit (HOSPITAL_COMMUNITY)
Admission: RE | Admit: 2022-03-08 | Discharge: 2022-03-08 | Disposition: A | Discharge status: Home / Self Care | Payer: Medicare HMO | Source: Ambulatory Visit | Attending: Internal Medicine | Admitting: Internal Medicine

## 2022-03-08 DIAGNOSIS — J449 Chronic obstructive pulmonary disease, unspecified: Secondary | ICD-10-CM | POA: Diagnosis not present

## 2022-03-08 LAB — PULMONARY FUNCTION TEST
DL/VA % pred: 62 %
DL/VA: 2.59 ml/min/mmHg/L
DLCO unc % pred: 41 %
DLCO unc: 8.8 ml/min/mmHg
FEF 25-75 Post: 1.27 L/sec
FEF 25-75 Pre: 1.01 L/sec
FEF2575-%Change-Post: 26 %
FEF2575-%Pred-Post: 55 %
FEF2575-%Pred-Pre: 43 %
FEV1-%Change-Post: 5 %
FEV1-%Pred-Post: 61 %
FEV1-%Pred-Pre: 58 %
FEV1-Post: 1.62 L
FEV1-Pre: 1.53 L
FEV1FVC-%Change-Post: -1 %
FEV1FVC-%Pred-Pre: 90 %
FEV6-%Change-Post: 6 %
FEV6-%Pred-Post: 69 %
FEV6-%Pred-Pre: 65 %
FEV6-Post: 2.31 L
FEV6-Pre: 2.16 L
FEV6FVC-%Change-Post: 0 %
FEV6FVC-%Pred-Post: 101 %
FEV6FVC-%Pred-Pre: 102 %
FVC-%Change-Post: 7 %
FVC-%Pred-Post: 68 %
FVC-%Pred-Pre: 63 %
FVC-Post: 2.35 L
FVC-Pre: 2.19 L
Post FEV1/FVC ratio: 69 %
Post FEV6/FVC ratio: 98 %
Pre FEV1/FVC ratio: 70 %
Pre FEV6/FVC Ratio: 99 %
RV % pred: 235 %
RV: 5.11 L
TLC % pred: 136 %
TLC: 7.32 L

## 2022-03-08 MED ORDER — ALBUTEROL SULFATE (2.5 MG/3ML) 0.083% IN NEBU
2.5000 mg | INHALATION_SOLUTION | Freq: Once | RESPIRATORY_TRACT | Status: AC
  Administered 2022-03-08: 2.5 mg via RESPIRATORY_TRACT

## 2022-03-12 ENCOUNTER — Other Ambulatory Visit: Payer: Self-pay

## 2022-03-12 ENCOUNTER — Other Ambulatory Visit: Payer: Self-pay | Admitting: Critical Care Medicine

## 2022-03-12 ENCOUNTER — Other Ambulatory Visit (HOSPITAL_COMMUNITY): Payer: Self-pay

## 2022-03-12 MED ORDER — CYCLOBENZAPRINE HCL 10 MG PO TABS
10.0000 mg | ORAL_TABLET | Freq: Three times a day (TID) | ORAL | 2 refills | Status: DC | PRN
  Filled 2022-03-12: qty 30, 10d supply, fill #0
  Filled 2022-03-20: qty 30, 10d supply, fill #1
  Filled 2022-04-11: qty 30, 10d supply, fill #2

## 2022-03-14 IMAGING — MR MR HEAD W/O CM
6 of 10 series · 27 of 48 positions shown · non-contrast
Comparison: None.

CLINICAL DATA: Neuro deficit, acute stroke suspected.

EXAM:
MRI HEAD WITHOUT CONTRAST
TECHNIQUE: Multiplanar, multiecho pulse sequences of the brain and surrounding
structures were obtained without intravenous contrast.

[Series 2: DWI · axial · 3.0mm · 0.94mm/px · z∈[-68,+85]mm · 8 of 104 slices shown (1 of 2)]
[im 1/104]
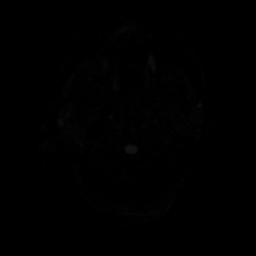
[im 12/104]
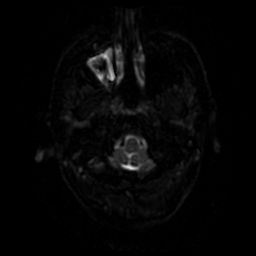
[im 35/104]
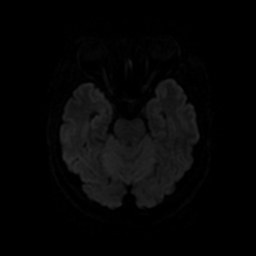
[im 46/104]
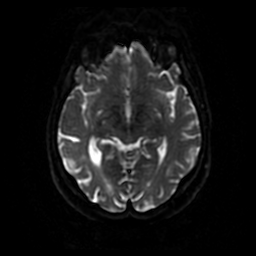
[im 58/104]
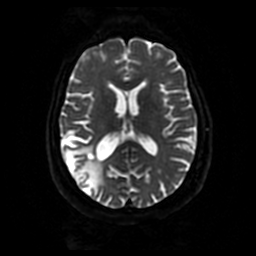
[im 69/104]
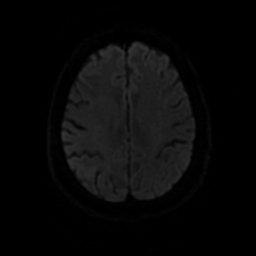
[im 92/104]
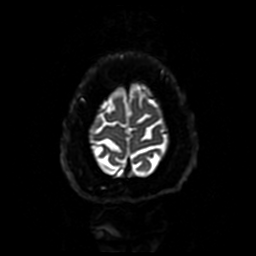
[im 104/104]
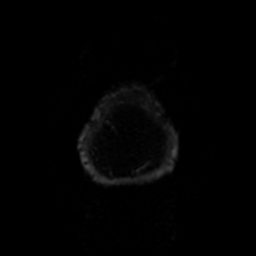

[Series 3: DWI · coronal · 4.0mm · 0.94mm/px · 7 of 78 slices shown (2 of 2)]
[im 1/78]
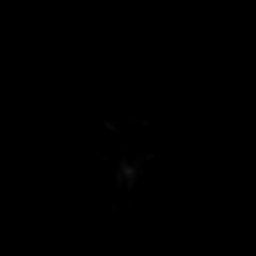
[im 13/78]
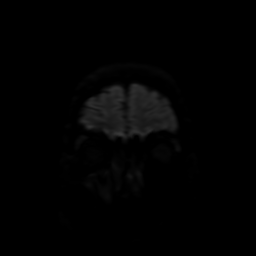
[im 26/78]
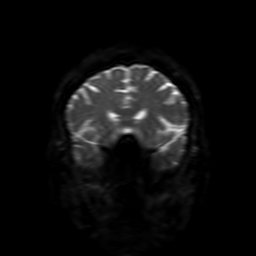
[im 39/78]
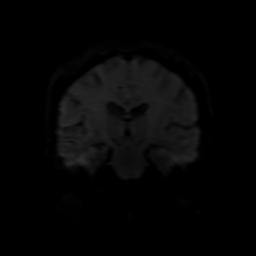
[im 52/78]
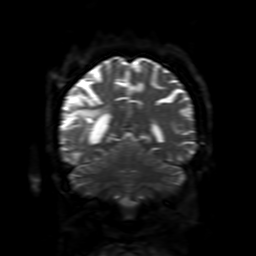
[im 65/78]
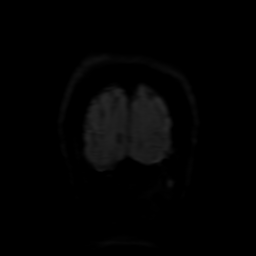
[im 78/78]
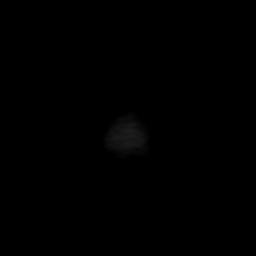

[Series 4: FLAIR · sagittal · 5.0mm · 0.23mm/px · 2 of 25 slices shown (1 of 2)]
[im 1/25]
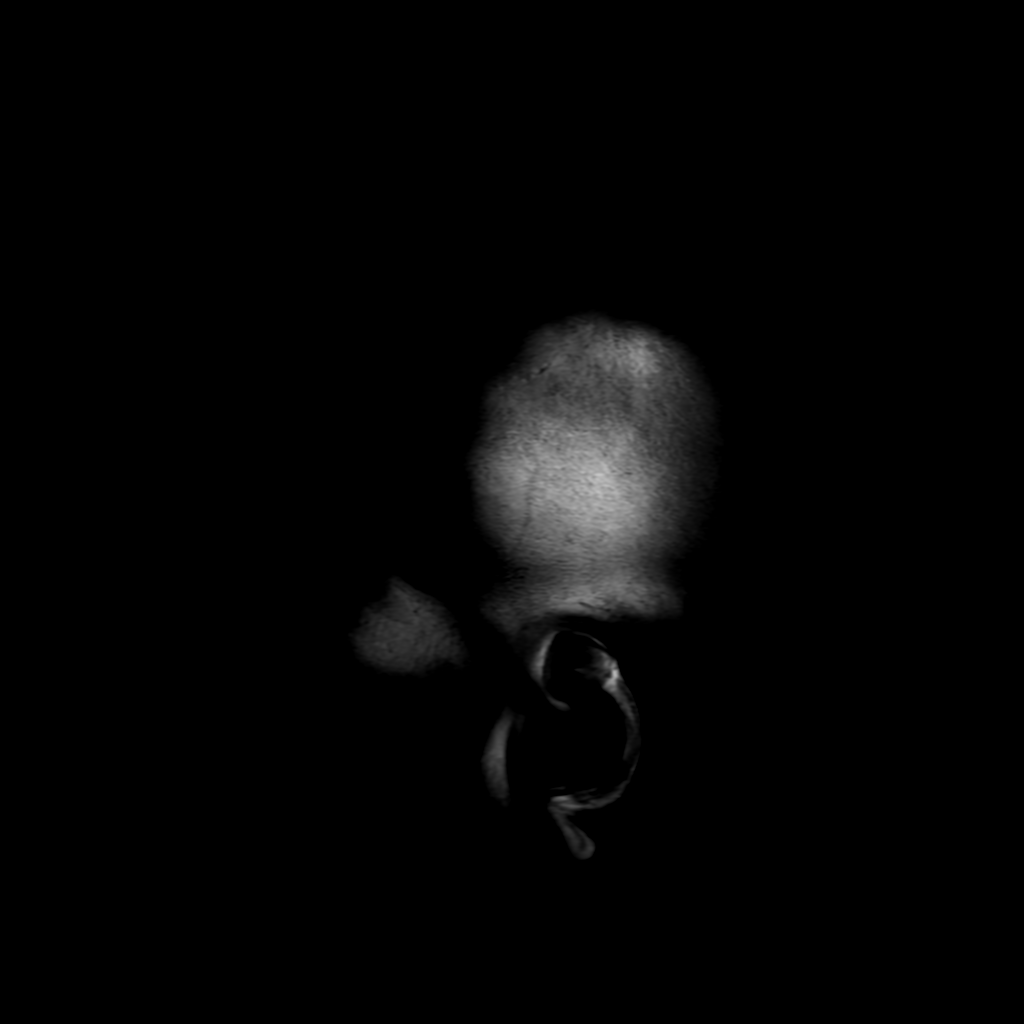
[im 25/25]
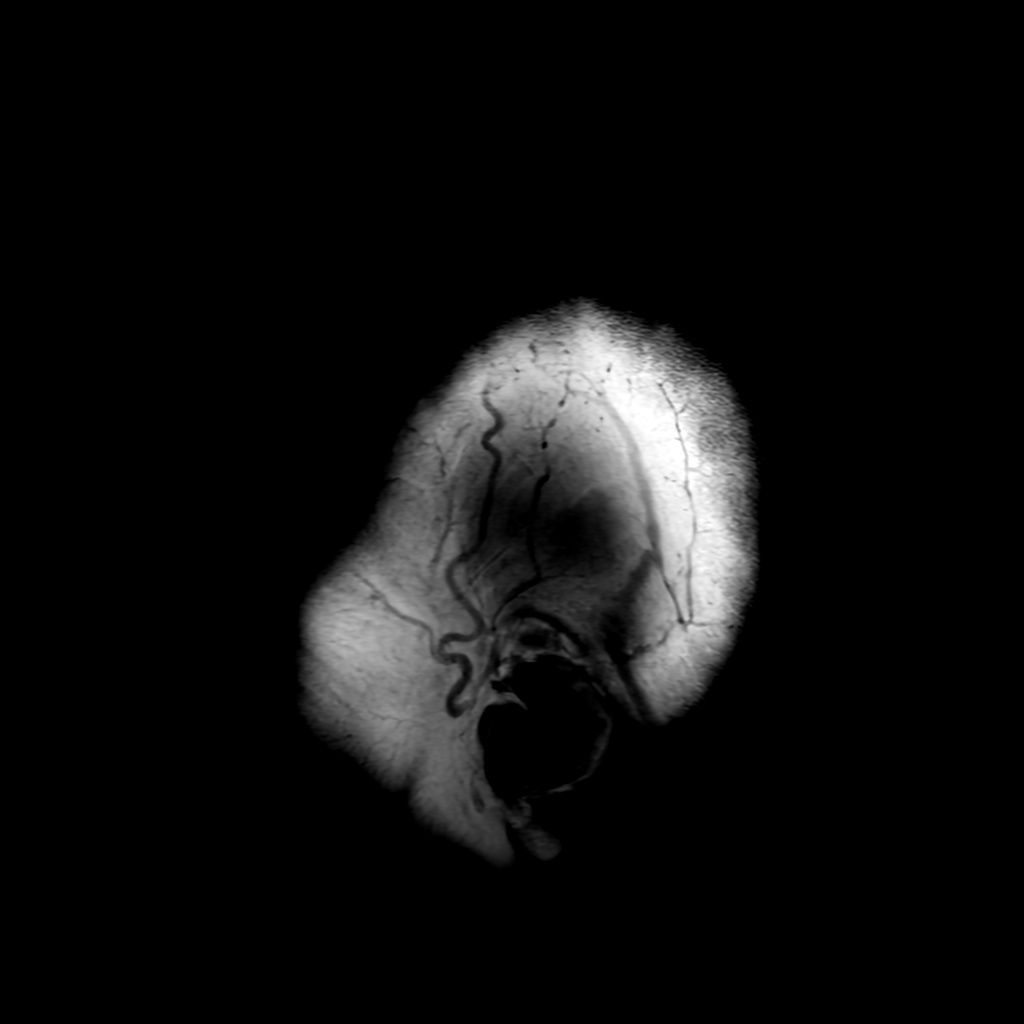

[Series 6: FLAIR · axial · 3.0mm · 0.45mm/px · z∈[-67,+83]mm · 2 of 26 slices shown (2 of 2)]
[im 1/26]
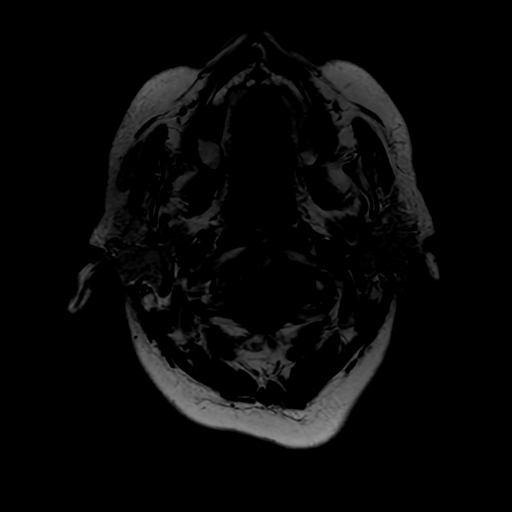
[im 26/26]
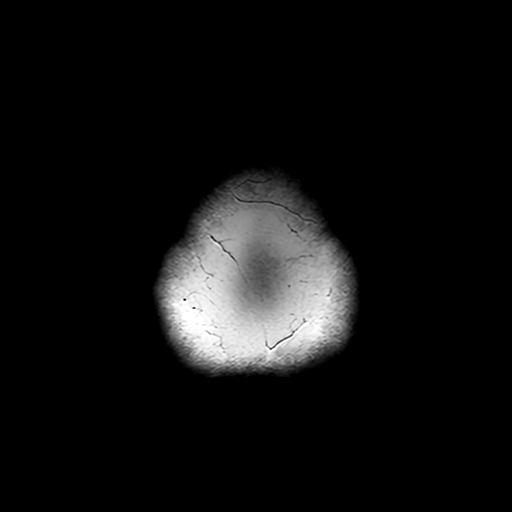

[Series 250: ADC · axial · 3.0mm · 0.94mm/px · z∈[-68,+85]mm · 5 of 52 slices shown (1 of 2)]
[im 1/52]
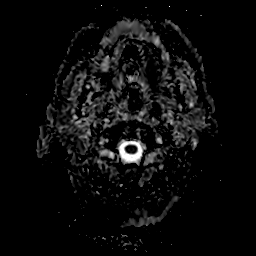
[im 13/52]
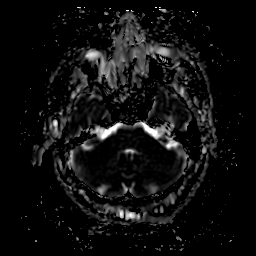
[im 26/52]
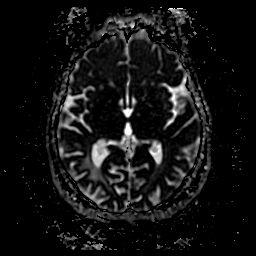
[im 39/52]
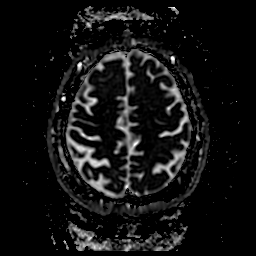
[im 52/52]
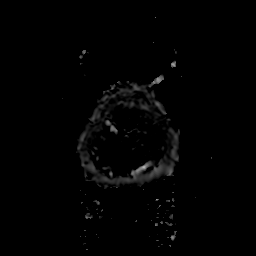

[Series 350: ADC · coronal · 4.0mm · 0.94mm/px · 3 of 38 slices shown (2 of 2)]
[im 1/38]
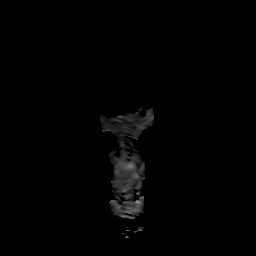
[im 19/38]
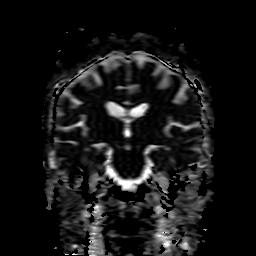
[im 38/38]
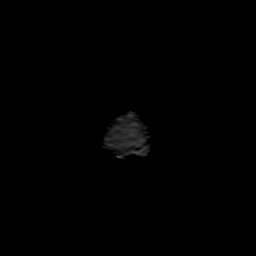

[27 of 48 positions shown; findings below may reference images not displayed]

FINDINGS: Brain: No evidence of acute infarct. Remote right parietal infarct
with encephalomalacia and surrounding gliosis. Additional mild
scattered T2/FLAIR hyperintensities in the white matter, nonspecific
but likely related to chronic microvascular ischemic disease. No
hydrocephalus. No extra-axial fluid collection. No mass lesion or
abnormal mass effect. No acute hemorrhage.

Vascular: Major arterial flow voids are maintained at the skull
base.

Skull and upper cervical spine: Normal marrow signal.

Sinuses/Orbits: Right maxillary sinus mucosal thickening.
Unremarkable orbits.

Other: Small left mastoid effusion.
IMPRESSION: 1. No evidence of acute intracranial abnormality.  No acute infarct.
2. Remote right parietal infarct.
3. Right maxillary sinus mucosal thickening.

## 2022-03-21 ENCOUNTER — Other Ambulatory Visit (HOSPITAL_COMMUNITY): Payer: Self-pay

## 2022-04-05 ENCOUNTER — Ambulatory Visit: Payer: Medicare HMO | Admitting: Internal Medicine

## 2022-04-11 ENCOUNTER — Other Ambulatory Visit: Payer: Self-pay | Admitting: Critical Care Medicine

## 2022-04-11 ENCOUNTER — Other Ambulatory Visit (HOSPITAL_COMMUNITY): Payer: Self-pay

## 2022-04-11 DIAGNOSIS — J449 Chronic obstructive pulmonary disease, unspecified: Secondary | ICD-10-CM

## 2022-04-11 DIAGNOSIS — H2513 Age-related nuclear cataract, bilateral: Secondary | ICD-10-CM | POA: Diagnosis not present

## 2022-04-11 DIAGNOSIS — Z0100 Encounter for examination of eyes and vision without abnormal findings: Secondary | ICD-10-CM | POA: Diagnosis not present

## 2022-04-12 ENCOUNTER — Other Ambulatory Visit (HOSPITAL_COMMUNITY): Payer: Self-pay

## 2022-04-12 MED ORDER — SPIRIVA HANDIHALER 18 MCG IN CAPS
1.0000 | ORAL_CAPSULE | Freq: Every day | RESPIRATORY_TRACT | 1 refills | Status: DC
  Filled 2022-04-12: qty 30, 30d supply, fill #0

## 2022-04-12 NOTE — Telephone Encounter (Signed)
Requested medication (s) are due for refill today: yes  Requested medication (s) are on the active medication list: expired  Last refill:  10/26/20  Future visit scheduled: yes  Notes to clinic:  Prescription has expired   Requested Prescriptions  Pending Prescriptions Disp Refills   tiotropium (SPIRIVA HANDIHALER) 18 MCG inhalation capsule 30 capsule 6    Sig: PLACE 1 CAPSULE (18 MCG TOTAL) INTO INHALER AND INHALE DAILY.     Pulmonology:  Anticholinergic Agents Passed - 04/11/2022  5:31 PM      Passed - Valid encounter within last 12 months    Recent Outpatient Visits           1 month ago Atrial fibrillation with RVR [facility])   [facility] [place], [place], [place]   9 months ago Achilles tendinitis of right lower extremity   [facility] [place], [place], [place]   1 year ago Left wristdrop   [facility] Storm Frisk, [place]   1 year ago Atrial fibrillation with RVR [facility])   [place] [facility] Storm Frisk, MD   1 year ago Recurrent falls   [facility] Cain Saupe, MD       Future Appointments             In 1 week Sherene Sires, Charlaine Dalton, MD [facility]   In 2 months Marcine Matar, MD [facility]

## 2022-04-20 ENCOUNTER — Ambulatory Visit: Payer: Medicare HMO | Admitting: Internal Medicine

## 2022-04-20 ENCOUNTER — Encounter: Payer: Self-pay | Admitting: Internal Medicine

## 2022-04-20 ENCOUNTER — Telehealth: Payer: Self-pay | Admitting: Internal Medicine

## 2022-04-20 ENCOUNTER — Other Ambulatory Visit: Payer: Self-pay

## 2022-04-20 DIAGNOSIS — J449 Chronic obstructive pulmonary disease, unspecified: Secondary | ICD-10-CM

## 2022-04-20 DIAGNOSIS — F17210 Nicotine dependence, cigarettes, uncomplicated: Secondary | ICD-10-CM | POA: Diagnosis not present

## 2022-04-20 MED ORDER — STIOLTO RESPIMAT 2.5-2.5 MCG/ACT IN AERS: INHALATION_SPRAY | RESPIRATORY_TRACT | 11 refills | Status: DC

## 2022-04-20 MED ORDER — STIOLTO RESPIMAT 2.5-2.5 MCG/ACT IN AERS
INHALATION_SPRAY | RESPIRATORY_TRACT | 11 refills | Status: DC
  Filled 2022-04-20: qty 4, 30d supply, fill #0
  Filled 2022-05-18: qty 4, 30d supply, fill #1
  Filled 2022-06-17: qty 4, 30d supply, fill #2
  Filled 2022-07-15: qty 4, 30d supply, fill #3
  Filled 2022-08-08: qty 4, 30d supply, fill #4
  Filled 2022-09-13: qty 4, 30d supply, fill #5
  Filled 2022-10-10: qty 4, 30d supply, fill #6
  Filled 2022-11-11: qty 4, 30d supply, fill #7
  Filled 2022-12-09: qty 4, 30d supply, fill #8
  Filled 2023-01-06: qty 4, 30d supply, fill #9
  Filled 2023-02-05: qty 4, 30d supply, fill #10
  Filled 2023-03-05: qty 4, 30d supply, fill #11
  Filled 2023-04-06: qty 4, 30d supply, fill #12

## 2022-04-20 NOTE — Patient Instructions (Addendum)
My office will be contacting you by phone for referral to lung cancer screening program   - if you don't hear back from my office within one week please call us back or notify us thru MyChart and we'll address it right away.   Plan A = Automatic = Always=    spiriva respimat (or stiolto) 2 pffs 1st thing in am  Work on inhaler technique:  relax and gently blow all the way out then take a nice smooth full deep breath back in, triggering the inhaler at same time you start breathing in.  Hold breath in for at least  5 seconds if you can.  Rinse and gargle with water when done.  If mouth or throat bother you at all,  try brushing teeth/gums/tongue with arm and hammer toothpaste/ make a slurry and gargle and spit out.      Plan B = Backup (to supplement plan A, not to replace it) Only use your albuterol inhaler as a rescue medication to be used if you can't catch your breath by resting or doing a relaxed purse lip breathing pattern.  - The less you use it, the better it will work when you need it. - Ok to use the inhaler up to 2 puffs  every 4 hours if you must but call for appointment if use goes up over your usual need - Don't leave home without it !!  (think of it like the spare tire for your car)   Plan C = Crisis (instead of Plan B but only if Plan B stops working) - only use your albuterol nebulizer if you first try Plan B and it fails to help > ok to use the nebulizer up to every 4 hours but if start needing it regularly call for immediate appointment  The key is to stop smoking completely before smoking completely stops you!  Please schedule a follow up visit in 3 months but call sooner if needed

## 2022-04-20 NOTE — Progress Notes (Unsigned)
Carrie Case, female    DOB: October 03, 1955  MRN: [id number]   Brief patient profile:  53  yowf  active smoker  referred to pulmonary clinic in [place]  11/01/2021 by Dr Dolphus Jenny for doe.    History of Present Illness  11/01/2021  Pulmonary/ 1st office eval/ Carrie Case / [place] Office  Chief Complaint  Patient presents with   Consult    Referred by Dr. [name] for Chronic bronchitis and COPD   Dyspnea:  MMRC2 = can't walk a nl pace on a flat grade s sob but does fine slow and flat no better on spiriva /some better on neb  Cough: none  Sleep: 3-4 x per week 30-45 degrees due to habit  SABA use: neb twice daily on avg - typically p exertion  Rec Plan A = Automatic = Always=    Spiriva one capsule each am  Plan B = Backup (to supplement plan A, not to replace it) Only use your albuterol inhaler  Plan C = Crisis (instead of Plan B but only if Plan B stops working) - only use your albuterol nebulizer if you first try Plan B   The key is to stop smoking completely before smoking completely stops you!  Please schedule a follow up visit in 3 months but call sooner if needed with pfts on return    04/20/2022  f/u ov/[place] office/Carrie Case re: GOLD 2 maint on spiriva   Chief Complaint  Patient presents with   Follow-up    Breathing is same since last ov   Dyspnea:  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, some better with spiriva dpi  Cough: none  Sleeping: still sleep recliner  SABA use: never neb/ rarely hfa  02: none  Covid status: never vax, never infected  Lung cancer screening: rec    No obvious day to day or daytime variability or assoc excess/ purulent sputum or mucus plugs or hemoptysis or cp or chest tightness, subjective wheeze or overt sinus or hb symptoms.   Sleeping as above  without nocturnal  or early am exacerbation  of respiratory  c/o's or need for noct saba. Also denies any obvious fluctuation of symptoms  with weather or environmental changes or other aggravating or alleviating factors except as outlined above   No unusual exposure hx or h/o childhood pna/ asthma or knowledge of premature birth.  Current Allergies, Complete Past Medical History, Past Surgical History, Family History, and Social History were reviewed in [employer] record.  ROS  The following are not active complaints unless bolded Hoarseness, sore throat, dysphagia, dental problems, itching, sneezing,  nasal congestion or discharge of excess mucus or purulent secretions, ear ache,   fever, chills, sweats, unintended wt loss or wt gain, classically pleuritic or exertional cp,  orthopnea pnd or arm/hand swelling  or leg swelling, presyncope, palpitations, abdominal pain, anorexia, nausea, vomiting, diarrhea  or change in bowel habits or change in bladder habits, change in stools or change in urine, dysuria, hematuria,  rash, arthralgias, visual complaints, headache, numbness, weakness or ataxia or problems with walking or coordination,  change in mood or  memory.        Current Meds  Medication Sig   albuterol (PROVENTIL) (2.5 MG/3ML) 0.083% nebulizer solution TAKE 3 MLS (2.5 MG TOTAL) BY NEBULIZATION EVERY 6 (SIX) HOURS AS NEEDED FOR WHEEZING OR SHORTNESS OF BREATH.   aspirin EC 81 MG tablet Take 1  tablet (81 mg total) by mouth daily.   cyclobenzaprine (FLEXERIL) 10 MG tablet Take 1 tablet (10 mg total) by mouth 3 (three) times daily as needed for muscle spasms.   diltiazem (CARTIA XT) 300 MG 24 hr capsule TAKE 1 CAPSULE (300 MG TOTAL) BY MOUTH DAILY.   rosuvastatin (CRESTOR) 20 MG tablet TAKE 1 TABLET (20 MG TOTAL) BY MOUTH DAILY.   tiotropium (SPIRIVA HANDIHALER) 18 MCG inhalation capsule PLACE 1 CAPSULE (18 MCG TOTAL) INTO INHALER AND INHALE DAILY.                   Past Medical History:  Diagnosis Date   Acute respiratory failure with hypoxia (HCC) 12/22/2018   Tobacco dependence         Objective:    Wts   04/20/2022         131   02/14/22 132 lb 6.4 oz (60.1 kg)  11/01/21 137 lb (62.1 kg)  08/29/21 139 lb (63 kg)      Vital signs reviewed  04/20/2022  - Note at rest 02 sats  94% on RA   General appearance:    amb wf nad     HEENT : Oropharynx  clear/ top denture, low dentition very poor   Nasal turbinates nl    NECK :  without  apparent JVD/ palpable Nodes/TM    LUNGS: no acc muscle use,  Mild barrel  contour chest wall with bilateral  Distant bs s audible wheeze and  without cough on insp or exp maneuvers  and mild  Hyperresonant  to  percussion bilaterally     CV:  RRR  no s3 or murmur or increase in P2, and no edema   ABD:  soft and nontender with pos end  insp Hoover's  in the supine position.  No bruits or organomegaly appreciated   MS:  Nl gait/ ext warm without deformities Or obvious joint restrictions  calf tenderness, cyanosis or clubbing     SKIN: warm and dry without lesions    NEURO:  alert, approp, nl sensorium with  no motor or cerebellar deficits apparent.                Assessment

## 2022-04-20 NOTE — Telephone Encounter (Signed)
Called [facility] and cancelled order for [facility].  Called and notified Junious Dresser and she voiced understanding. Nothing further needed

## 2022-04-21 ENCOUNTER — Encounter: Payer: Self-pay | Admitting: Internal Medicine

## 2022-04-21 NOTE — Assessment & Plan Note (Addendum)
Active smoking  - 11/01/2021  After extensive coaching inhaler device,  effectiveness =  75%> continue spiriva dpi and prn saba  - 11/01/2021   Walked on RA  x  3  lap(s) =  approx 450  ft  @ mod pace, stopped due to end of study, sob on 3rd lap with lowest 02 sats 94%  - PFT's  03/08/22  FEV1 1.62 (61 % ) ratio 0.69  p 5 % improvement from saba p  spiriva prior to study with DLCO  8.80 (41%)   and FV curve mild concavity   - 04/20/2022  After extensive coaching inhaler device,  effectiveness = 75%    SMI > try spiriva smi sample then rx with stiolto trial > f/u q 3 m    Pt is Group B in terms of symptom/risk and laba/lama therefore appropriate rx at this point >>>  stiolto best option if covered by insurance  Plus approp saba

## 2022-04-21 NOTE — Assessment & Plan Note (Signed)
Counseled re importance of smoking cessation but did not meet time criteria for separate billing    Low-dose CT lung cancer screening is recommended for patients who are 31-65 years of age with a 20+ pack-year history of smoking and who are currently smoking or quit <=15 years ago. No coughing up blood  No unintentional weight loss of > 15 pounds in the last 6 months - pt is eligible for scanning yearly until age 66 by present standards > referred          Each maintenance medication was reviewed in detail including emphasizing most importantly the difference between maintenance and prns and under what circumstances the prns are to be triggered using an action plan format where appropriate.  Total time for H and P, chart review, counseling, reviewing smi/hfa /neb  device(s) and generating customized AVS unique to this office visit / same day charting = 34 min

## 2022-04-24 ENCOUNTER — Other Ambulatory Visit: Payer: Self-pay

## 2022-04-29 ENCOUNTER — Other Ambulatory Visit: Payer: Self-pay | Admitting: Critical Care Medicine

## 2022-04-30 ENCOUNTER — Other Ambulatory Visit: Payer: Self-pay

## 2022-05-01 ENCOUNTER — Other Ambulatory Visit (HOSPITAL_COMMUNITY): Payer: Self-pay

## 2022-05-01 MED ORDER — CYCLOBENZAPRINE HCL 10 MG PO TABS
10.0000 mg | ORAL_TABLET | Freq: Three times a day (TID) | ORAL | 0 refills | Status: DC | PRN
  Filled 2022-05-01: qty 90, 30d supply, fill #0

## 2022-05-11 ENCOUNTER — Other Ambulatory Visit (HOSPITAL_COMMUNITY): Payer: Self-pay

## 2022-05-19 ENCOUNTER — Other Ambulatory Visit (HOSPITAL_COMMUNITY): Payer: Self-pay

## 2022-05-21 ENCOUNTER — Ambulatory Visit (INDEPENDENT_AMBULATORY_CARE_PROVIDER_SITE_OTHER): Payer: Medicare HMO | Admitting: Internal Medicine

## 2022-05-21 ENCOUNTER — Other Ambulatory Visit: Payer: Self-pay

## 2022-05-21 ENCOUNTER — Encounter: Payer: Self-pay | Admitting: Internal Medicine

## 2022-05-21 VITALS — BP 128/72 | HR 76 | Resp 18 | Ht 65.0 in | Wt 134.6 lb

## 2022-05-21 DIAGNOSIS — Z7689 Persons encountering health services in other specified circumstances: Secondary | ICD-10-CM | POA: Diagnosis not present

## 2022-05-21 DIAGNOSIS — I4891 Unspecified atrial fibrillation: Secondary | ICD-10-CM | POA: Diagnosis not present

## 2022-05-21 DIAGNOSIS — E785 Hyperlipidemia, unspecified: Secondary | ICD-10-CM | POA: Insufficient documentation

## 2022-05-21 DIAGNOSIS — M810 Age-related osteoporosis without current pathological fracture: Secondary | ICD-10-CM | POA: Diagnosis not present

## 2022-05-21 DIAGNOSIS — R058 Other specified cough: Secondary | ICD-10-CM

## 2022-05-21 DIAGNOSIS — G4700 Insomnia, unspecified: Secondary | ICD-10-CM

## 2022-05-21 DIAGNOSIS — Z13820 Encounter for screening for osteoporosis: Secondary | ICD-10-CM | POA: Diagnosis not present

## 2022-05-21 DIAGNOSIS — M79604 Pain in right leg: Secondary | ICD-10-CM

## 2022-05-21 DIAGNOSIS — J449 Chronic obstructive pulmonary disease, unspecified: Secondary | ICD-10-CM

## 2022-05-21 DIAGNOSIS — Z5320 Procedure and treatment not carried out because of patient's decision for unspecified reasons: Secondary | ICD-10-CM

## 2022-05-21 DIAGNOSIS — Z1159 Encounter for screening for other viral diseases: Secondary | ICD-10-CM | POA: Diagnosis not present

## 2022-05-21 DIAGNOSIS — F17210 Nicotine dependence, cigarettes, uncomplicated: Secondary | ICD-10-CM

## 2022-05-21 DIAGNOSIS — Z2821 Immunization not carried out because of patient refusal: Secondary | ICD-10-CM | POA: Diagnosis not present

## 2022-05-21 DIAGNOSIS — F331 Major depressive disorder, recurrent, moderate: Secondary | ICD-10-CM

## 2022-05-21 DIAGNOSIS — Z131 Encounter for screening for diabetes mellitus: Secondary | ICD-10-CM

## 2022-05-21 DIAGNOSIS — E782 Mixed hyperlipidemia: Secondary | ICD-10-CM

## 2022-05-21 DIAGNOSIS — E875 Hyperkalemia: Secondary | ICD-10-CM | POA: Diagnosis not present

## 2022-05-21 MED ORDER — BENZONATATE 100 MG PO CAPS
100.0000 mg | ORAL_CAPSULE | Freq: Two times a day (BID) | ORAL | 0 refills | Status: DC | PRN
  Filled 2022-05-21: qty 20, 10d supply, fill #0

## 2022-05-21 NOTE — Assessment & Plan Note (Signed)
Followed by pulmonology.  Currently prescribed Stiolto for daily use and albuterol as needed.  She endorses a dry cough x3 weeks.  Denies shortness of breath.  Unremarkable pulmonary exam today.  Continues to smoke 0.5 packs/day of cigarettes. -Smoking cessation encouraged

## 2022-05-21 NOTE — Assessment & Plan Note (Signed)
Current, everyday smoker.  States that she is smoking 0.5 packs/day of cigarettes.  She has smoked since age 66 and at one point was smoking 4 packs/day.  She reports trying numerous cessation products without sustained success. -Again counseled on the importance of smoking cessation -Meets criteria for low-dose CT lung cancer screening.  This is being coordinated by pulmonology.

## 2022-05-21 NOTE — Assessment & Plan Note (Signed)
She reports a 3-week history of a nonproductive, dry cough.  She states that her ribs are hurting from coughing.  She requests medication for cough relief today. -Tessalon Perles prescribed

## 2022-05-21 NOTE — Assessment & Plan Note (Signed)
She endorses chronic issues with both falling and staying asleep.  She currently sleeps in a recliner in her living room.  She states that the TV is always on.  We reviewed appropriate sleep hygiene measures, including not sleeping with the TV on, not eating or drinking right before bedtime, limiting cell phone use, and trying to do relaxing activity right before bedtime. -Follow-up in 1 month

## 2022-05-21 NOTE — Patient Instructions (Signed)
It was a pleasure to see you today.  Thank you for giving [place] the opportunity to be involved in your care.  Below is a brief recap of your visit and next steps.  We will plan to see you again in 1 month.  Summary We will check basic labs I have prescribed tessalon perles for cough relief I have given you home PT exercises for your leg pain  Next steps Follow up in 1 month for pneumonia shot, re-evaluation of leg pain, and to see how things are going from a mood standpoint.

## 2022-05-21 NOTE — Assessment & Plan Note (Signed)
Presenting today to establish care. -Recent labs reviewed.  I have also ordered repeat CMP, HgbA1c, and vitamin D.  One-time HCV screening added on as well. -She declined influenza and COVID-19 vaccines. -I recommended that she receive Shingrix and Tdap vaccines at her pharmacy -She states that she has previously received pneumococcal vaccination.  I do not have records of this.  Follow-up in 1 month for PCV 20. -She declined Pap smear, mammogram and colorectal cancer screening today.  Her family medical history is significant for breast cancer in both her mother and one of her sisters.  She states that she does not want to know if she has cancer and is not interested in undergoing the screenings. -DEXA ordered today

## 2022-05-21 NOTE — Assessment & Plan Note (Signed)
She reports a history of MDD, previously prescribed Celexa.  PHQ-9 elevated today (15).  She denies SI/HI.  Most of her symptoms are related to living alone and feeling lonely.  She is not interested in referrals to psychiatry or counseling today.  She does not want to start any new medications due to concern for side effects. -Follow-up in 1 month for reassessment

## 2022-05-21 NOTE — Assessment & Plan Note (Signed)
Followed by cardiology.  Currently prescribed Cardizem and ASA 81 mg daily.  Asymptomatic today RRR on exam today.

## 2022-05-21 NOTE — Assessment & Plan Note (Signed)
Currently prescribed Crestor 20 mg daily.  Lipid panel updated in June 2023.  No changes today.

## 2022-05-21 NOTE — Assessment & Plan Note (Signed)
She endorses chronic right lower extremity pain today.  She states the pain starts in the outer quadrants of her right buttock and radiates along the posterior aspect of her right leg.  There is no associated weakness or numbness.  Pain is worse with movement.  There is tenderness to palpation of the outer quadrants of the right buttock on exam today.  Positive SLR on the right leg.  No weakness appreciated. -Her description of pain and pattern seems most consistent with sciatica.  I have provided her with home PT exercises today.  We will follow-up in 1 month for reassessment.

## 2022-05-21 NOTE — Progress Notes (Signed)
New Patient Office Visit  Subjective    Patient ID: Carrie Case, female    DOB: [birth year]  Age: 66 y.o. MRN: [id number]  CC:  Chief Complaint  Patient presents with   New Patient (Initial Visit)    New patient just establishing care    HPI Carrie Case presents to establish care.  She is a 66 year old woman with a past medical history significant for COPD, A-fib, HLD, current tobacco use, PAD, and left subclavian artery stenosis.  She was previously followed by Dr. Asencion Noble.  Today Carrie Case states that she feels well.  Here acute concerns are chronic right lower extremity pain, a dry cough, and insomnia.  She is accompanied by her daughter today.  Carrie Case states that she has had pain in her right buttock and lower leg for multiple years.  Her pain is worse with movement.  She describes shooting pain originating in her right buttock and radiating proximally along the posterior portion of her right leg.  She denies saddle anesthesia, bowel/bladder incontinence, and numbness/weakness in her right leg.  She also endorses a dry cough today that has been bothersome for the past 3 weeks.  She request medication for cough relief today.  Carrie Case lastly endorses issues with insomnia.  She states that she is able to fall asleep for brief periods of time but within wake up and is unable to fall asleep for multiple hours.  This occurs several times each night.  Acute concerns, chronic medical conditions, and outstanding preventative healthcare maintenance items discussed today are individually addressed in A/P below.  Outpatient Encounter Medications as of 05/21/2022  Medication Sig   albuterol (PROVENTIL) (2.5 MG/3ML) 0.083% nebulizer solution TAKE 3 MLS (2.5 MG TOTAL) BY NEBULIZATION EVERY 6 (SIX) HOURS AS NEEDED FOR WHEEZING OR SHORTNESS OF BREATH.   aspirin EC 81 MG tablet Take 1 tablet (81 mg total) by mouth daily.   benzonatate (TESSALON) 100 MG capsule Take 1 capsule  (100 mg total) by mouth 2 (two) times daily as needed for cough.   cyclobenzaprine (FLEXERIL) 10 MG tablet Take 1 tablet (10 mg total) by mouth 3 (three) times daily as needed for muscle spasms.   diltiazem (CARTIA XT) 300 MG 24 hr capsule TAKE 1 CAPSULE (300 MG TOTAL) BY MOUTH DAILY.   rosuvastatin (CRESTOR) 20 MG tablet TAKE 1 TABLET (20 MG TOTAL) BY MOUTH DAILY.   Tiotropium Bromide-Olodaterol (STIOLTO RESPIMAT) 2.5-2.5 MCG/ACT AERS Inhale 2 puffs by mouth every morning.   albuterol (VENTOLIN HFA) 108 (90 Base) MCG/ACT inhaler INHALE 1-2 PUFFS INTO THE LUNGS EVERY 6 (SIX) HOURS AS NEEDED FOR WHEEZING OR SHORTNESS OF BREATH.   No facility-administered encounter medications on file as of 05/21/2022.   Past Medical History:  Diagnosis Date   Acute respiratory failure with hypoxia ([place]) 12/22/2018   Tobacco dependence    Past Surgical History:  Procedure Laterality Date   TUBAL LIGATION     Family History  Problem Relation Age of Onset   Hypertension Mother    Hyperlipidemia Mother    Cancer Mother    COPD Mother    Anxiety disorder Mother    Social History   Socioeconomic History   Marital status: Widowed    Spouse name: Not on file   Number of children: Not on file   Years of education: Not on file   Highest education level: Not on file  Occupational History   Occupation: unemployed  Tobacco Use   Smoking status:  Every Day    Packs/day: 0.50    Years: 47.00    Total pack years: 23.50    Types: Cigarettes   Smokeless tobacco: Never   Tobacco comments:    Smoking about 1/2 ppd per patient on 11/01/2021  Vaping Use   Vaping Use: Never used  Substance and Sexual Activity   Alcohol use: No   Drug use: No   Sexual activity: Not Currently  Other Topics Concern   Not on file  Social History Narrative   Not on file   Social Determinants of Health   Financial Resource Strain: Medium Risk (12/29/2021)   Overall Financial Resource Strain (CARDIA)    Difficulty of Paying  Living Expenses: Somewhat hard  Food Insecurity: No Food Insecurity (12/29/2021)   Hunger Vital Sign    Worried About Running Out of Food in the Last Year: Never true    Ran Out of Food in the Last Year: Never true  Transportation Needs: No Transportation Needs (12/29/2021)   PRAPARE - Hydrologist (Medical): No    Lack of Transportation (Non-Medical): No  Physical Activity: Not on file  Stress: Not on file  Social Connections: Not on file  Intimate Partner Violence: Not on file   Review of Systems  Constitutional:  Negative for chills and fever.  HENT:  Negative for sore throat.   Respiratory:  Positive for cough. Negative for shortness of breath.   Cardiovascular:  Negative for chest pain, palpitations and leg swelling.  Gastrointestinal:  Negative for abdominal pain, blood in stool, constipation, diarrhea, nausea and vomiting.  Genitourinary:  Negative for dysuria and hematuria.  Musculoskeletal:        R buttock and lower extremity pain  Skin:  Negative for itching and rash.  Neurological:  Negative for dizziness and headaches.  Psychiatric/Behavioral:  Negative for depression and suicidal ideas. The patient has insomnia.    Objective    BP 128/72 (BP Location: Right Arm, Patient Position: Sitting, Cuff Size: Normal)   Pulse 76   Resp 18   Ht 5' 5" (1.651 m)   Wt 134 lb 9.6 oz (61.1 kg)   SpO2 95%   BMI 22.40 kg/m   Physical Exam Constitutional:      General: She is not in acute distress.    Appearance: She is normal weight. She is not toxic-appearing.     Comments: Appears older than stated age  HENT:     Head: Normocephalic and atraumatic.     Right Ear: External ear normal.     Left Ear: External ear normal.     Nose: Nose normal. No congestion or rhinorrhea.     Mouth/Throat:     Mouth: Mucous membranes are moist.     Pharynx: Oropharynx is clear. No oropharyngeal exudate or posterior oropharyngeal erythema.  Eyes:     General: No  scleral icterus.    Extraocular Movements: Extraocular movements intact.     Conjunctiva/sclera: Conjunctivae normal.     Pupils: Pupils are equal, round, and reactive to light.  Cardiovascular:     Rate and Rhythm: Normal rate and regular rhythm.     Pulses: Normal pulses.     Heart sounds: Normal heart sounds. No murmur heard.    No friction rub. No gallop.  Pulmonary:     Effort: Pulmonary effort is normal.     Breath sounds: Normal breath sounds. No wheezing, rhonchi or rales.  Abdominal:     General: Abdomen is  flat. Bowel sounds are normal. There is no distension.     Palpations: Abdomen is soft.     Tenderness: There is no abdominal tenderness.  Musculoskeletal:        General: Normal range of motion.     Cervical back: Normal range of motion.     Comments: There is tenderness palpation over the outer quadrant of the right buttock.  Positive SLR on the right.  5/5 strength in the right lower extremity.  ROM at the hip and knee is normal.  Lymphadenopathy:     Cervical: No cervical adenopathy.  Skin:    General: Skin is warm and dry.     Capillary Refill: Capillary refill takes less than 2 seconds.     Coloration: Skin is not jaundiced.  Neurological:     General: No focal deficit present.     Mental Status: She is alert and oriented to person, place, and time.     Motor: No weakness.     Deep Tendon Reflexes: Reflexes normal.  Psychiatric:        Mood and Affect: Mood normal.        Behavior: Behavior normal.    Last CBC Lab Results  Component Value Date   WBC 8.6 02/14/2022   HGB 15.5 02/14/2022   HCT 45.1 02/14/2022   MCV 95 02/14/2022   MCH 32.6 02/14/2022   RDW 13.7 02/14/2022   PLT 307 40/98/1191   Last metabolic panel Lab Results  Component Value Date   GLUCOSE 82 02/14/2022   NA 138 02/14/2022   K 4.0 02/14/2022   CL 99 02/14/2022   CO2 24 02/14/2022   BUN 6 (L) 02/14/2022   CREATININE 0.75 02/14/2022   EGFR 89 02/14/2022   CALCIUM 10.1  02/14/2022   PROT 7.7 02/14/2022   ALBUMIN 4.3 02/14/2022   LABGLOB 3.4 02/14/2022   AGRATIO 1.3 02/14/2022   BILITOT 0.4 02/14/2022   ALKPHOS 87 02/14/2022   AST 12 02/14/2022   ALT 10 02/14/2022   ANIONGAP 10 12/24/2018   Last lipids Lab Results  Component Value Date   CHOL 145 02/14/2022   HDL 47 02/14/2022   LDLCALC 80 02/14/2022   TRIG 94 02/14/2022   CHOLHDL 3.1 02/14/2022   Last hemoglobin A1c Lab Results  Component Value Date   HGBA1C 5.6 12/22/2018   Last thyroid functions Lab Results  Component Value Date   TSH 3.290 02/14/2022   T4TOTAL 6.3 02/14/2022   Assessment & Plan:   Problem List Items Addressed This Visit     Atrial fibrillation with RVR ([place]) (Chronic)    Followed by cardiology.  Currently prescribed Cardizem and ASA 81 mg daily.  Asymptomatic today RRR on exam today.      COPD  GOLD 2 / still smoking     Followed by pulmonology.  Currently prescribed Stiolto for daily use and albuterol as needed.  She endorses a dry cough x3 weeks.  Denies shortness of breath.  Unremarkable pulmonary exam today.  Continues to smoke 0.5 packs/day of cigarettes. -Smoking cessation encouraged      Cigarette smoker    Current, everyday smoker.  States that she is smoking 0.5 packs/day of cigarettes.  She has smoked since age 15 and at one point was smoking 4 packs/day.  She reports trying numerous cessation products without sustained success. -Again counseled on the importance of smoking cessation -Meets criteria for low-dose CT lung cancer screening.  This is being coordinated by pulmonology.  Moderate episode of recurrent major depressive disorder ([place])    She reports a history of MDD, previously prescribed Celexa.  PHQ-9 elevated today (15).  She denies SI/HI.  Most of her symptoms are related to living alone and feeling lonely.  She is not interested in referrals to psychiatry or counseling today.  She does not want to start any new medications due to  concern for side effects. -Follow-up in 1 month for reassessment      Hyperlipidemia    Currently prescribed Crestor 20 mg daily.  Lipid panel updated in June 2023.  No changes today.      Dry cough    She reports a 3-week history of a nonproductive, dry cough.  She states that her ribs are hurting from coughing.  She requests medication for cough relief today. -Tessalon Perles prescribed      Lower extremity pain, posterior, right    She endorses chronic right lower extremity pain today.  She states the pain starts in the outer quadrants of her right buttock and radiates along the posterior aspect of her right leg.  There is no associated weakness or numbness.  Pain is worse with movement.  There is tenderness to palpation of the outer quadrants of the right buttock on exam today.  Positive SLR on the right leg.  No weakness appreciated. -Her description of pain and pattern seems most consistent with sciatica.  I have provided her with home PT exercises today.  We will follow-up in 1 month for reassessment.      Insomnia    She endorses chronic issues with both falling and staying asleep.  She currently sleeps in a recliner in her living room.  She states that the TV is always on.  We reviewed appropriate sleep hygiene measures, including not sleeping with the TV on, not eating or drinking right before bedtime, limiting cell phone use, and trying to do relaxing activity right before bedtime. -Follow-up in 1 month      Encounter to establish care    Presenting today to establish care. -Recent labs reviewed.  I have also ordered repeat CMP, HgbA1c, and vitamin D.  One-time HCV screening added on as well. -She declined influenza and COVID-19 vaccines. -I recommended that she receive Shingrix and Tdap vaccines at her pharmacy -She states that she has previously received pneumococcal vaccination.  I do not have records of this.  Follow-up in 1 month for PCV 20. -She declined Pap smear,  mammogram and colorectal cancer screening today.  Her family medical history is significant for breast cancer in both her mother and one of her sisters.  She states that she does not want to know if she has cancer and is not interested in undergoing the screenings. -DEXA ordered today      Return in about 4 weeks (around 06/18/2022).   Johnette Abraham, MD

## 2022-05-22 ENCOUNTER — Other Ambulatory Visit: Payer: Self-pay

## 2022-05-22 ENCOUNTER — Other Ambulatory Visit: Payer: Self-pay | Admitting: Internal Medicine

## 2022-05-22 ENCOUNTER — Other Ambulatory Visit (HOSPITAL_COMMUNITY): Payer: Self-pay

## 2022-05-22 DIAGNOSIS — E559 Vitamin D deficiency, unspecified: Secondary | ICD-10-CM

## 2022-05-22 LAB — CMP14+EGFR
ALT: 8 IU/L (ref 0–32)
AST: 14 IU/L (ref 0–40)
Albumin/Globulin Ratio: 1.3 (ref 1.2–2.2)
Albumin: 4.3 g/dL (ref 3.9–4.9)
Alkaline Phosphatase: 77 IU/L (ref 44–121)
BUN/Creatinine Ratio: 21 (ref 12–28)
BUN: 15 mg/dL (ref 8–27)
Bilirubin Total: <0.2 mg/dL (ref 0.0–1.2)
CO2: 19 mmol/L — ABNORMAL LOW (ref 20–29)
Calcium: 10.4 mg/dL — ABNORMAL HIGH (ref 8.7–10.3)
Chloride: 105 mmol/L (ref 96–106)
Creatinine, Ser: 0.72 mg/dL (ref 0.57–1.00)
Globulin, Total: 3.4 g/dL (ref 1.5–4.5)
Glucose: 80 mg/dL (ref 70–99)
Potassium: 5.5 mmol/L — ABNORMAL HIGH (ref 3.5–5.2)
Sodium: 140 mmol/L (ref 134–144)
Total Protein: 7.7 g/dL (ref 6.0–8.5)
eGFR: 93 mL/min/{1.73_m2} (ref >59)

## 2022-05-22 LAB — HCV AB W REFLEX TO QUANT PCR: HCV Ab: NONREACTIVE

## 2022-05-22 LAB — HEMOGLOBIN A1C
Est. average glucose Bld gHb Est-mCnc: 114 mg/dL
Hgb A1c MFr Bld: 5.6 % (ref 4.8–5.6)

## 2022-05-22 LAB — HCV INTERPRETATION

## 2022-05-22 LAB — VITAMIN D 25 HYDROXY (VIT D DEFICIENCY, FRACTURES): Vit D, 25-Hydroxy: 16.5 ng/mL — ABNORMAL LOW (ref 30.0–100.0)

## 2022-05-22 MED ORDER — VITAMIN D (ERGOCALCIFEROL) 1.25 MG (50000 UNIT) PO CAPS
50000.0000 [IU] | ORAL_CAPSULE | ORAL | 0 refills | Status: AC
  Filled 2022-05-22: qty 12, 84d supply, fill #0

## 2022-05-23 ENCOUNTER — Other Ambulatory Visit: Payer: Self-pay

## 2022-06-04 ENCOUNTER — Ambulatory Visit (HOSPITAL_COMMUNITY)
Admission: RE | Admit: 2022-06-04 | Discharge: 2022-06-04 | Disposition: A | Discharge status: Home / Self Care | Payer: Medicare HMO | Source: Ambulatory Visit | Attending: Internal Medicine | Admitting: Internal Medicine

## 2022-06-04 DIAGNOSIS — M810 Age-related osteoporosis without current pathological fracture: Secondary | ICD-10-CM | POA: Insufficient documentation

## 2022-06-04 DIAGNOSIS — Z13820 Encounter for screening for osteoporosis: Secondary | ICD-10-CM | POA: Diagnosis not present

## 2022-06-04 DIAGNOSIS — Z780 Asymptomatic menopausal state: Secondary | ICD-10-CM | POA: Diagnosis not present

## 2022-06-11 ENCOUNTER — Other Ambulatory Visit (HOSPITAL_COMMUNITY): Payer: Self-pay

## 2022-06-12 ENCOUNTER — Ambulatory Visit: Payer: Medicaid Other | Admitting: Internal Medicine

## 2022-06-12 ENCOUNTER — Ambulatory Visit: Payer: Medicaid Other | Admitting: Critical Care Medicine

## 2022-06-18 ENCOUNTER — Other Ambulatory Visit: Payer: Self-pay

## 2022-06-18 ENCOUNTER — Ambulatory Visit (INDEPENDENT_AMBULATORY_CARE_PROVIDER_SITE_OTHER): Payer: Medicare HMO | Admitting: Internal Medicine

## 2022-06-18 ENCOUNTER — Other Ambulatory Visit (HOSPITAL_COMMUNITY): Payer: Self-pay

## 2022-06-18 ENCOUNTER — Encounter: Payer: Self-pay | Admitting: Internal Medicine

## 2022-06-18 VITALS — BP 98/62 | HR 74 | Ht 66.0 in | Wt 136.6 lb

## 2022-06-18 DIAGNOSIS — K1379 Other lesions of oral mucosa: Secondary | ICD-10-CM

## 2022-06-18 DIAGNOSIS — E875 Hyperkalemia: Secondary | ICD-10-CM

## 2022-06-18 DIAGNOSIS — M810 Age-related osteoporosis without current pathological fracture: Secondary | ICD-10-CM | POA: Diagnosis not present

## 2022-06-18 MED ORDER — OXYCODONE HCL 5 MG PO TABS: 5.0000 mg | ORAL_TABLET | ORAL | 0 refills | Status: DC | PRN

## 2022-06-18 MED ORDER — OXYCODONE HCL 5 MG PO TABS
5.0000 mg | ORAL_TABLET | ORAL | 0 refills | Status: DC | PRN
  Filled 2022-06-18: qty 5, 1d supply, fill #0

## 2022-06-18 NOTE — Progress Notes (Signed)
Established Patient Office Visit  Subjective   Patient ID: Carrie Case, female    DOB: June 08, 1956  Age: 66 y.o. MRN: [id number]  Chief Complaint  Patient presents with   Follow-up   [name] returns to care today.  She is a 66 year old woman with a past medical history significant for COPD, atrial fibrillation, HLD, current tobacco use, PAD, and left subclavian artery stenosis.  She was last seen by me on 05/21/22 to establish care.  At that time she endorsed right lower extremity pain.  Her symptoms and exam findings seem most consistent with sciatica.  She was provided with home PT exercises.  Her PHQ-9 score was also elevated on (15).  Repeat labs and DEXA scan were ordered.  4-week follow-up was arranged.  There have been no acute interval events.  Today [name]'s acute concern is dental pain.  She reports a second that she has completed antibiotics for.  She has follow-up with her dentist in 2 weeks for extraction of multiple teeth, with ultimate plan for dentures.  She is currently managing her pain with Tylenol, which is very effective.  Acute concerns, chronic medical conditions, and outstanding preventative care items discussed today are individually addressed in A/P below.  Past Medical History:  Diagnosis Date   Acute respiratory failure with hypoxia ([place]) 12/22/2018   Tobacco dependence    Past Surgical History:  Procedure Laterality Date   TUBAL LIGATION     Social History   Tobacco Use   Smoking status: Every Day    Packs/day: 0.50    Years: 47.00    Total pack years: 23.50    Types: Cigarettes   Smokeless tobacco: Never   Tobacco comments:    Smoking about 1/2 ppd per patient on 11/01/2021  Vaping Use   Vaping Use: Never used  Substance Use Topics   Alcohol use: No   Drug use: No   Family History  Problem Relation Age of Onset   Hypertension Mother    Hyperlipidemia Mother    Cancer Mother    COPD Mother    Anxiety disorder Mother    Allergies   Allergen Reactions   Ibuprofen Hives   Review of Systems  HENT:         Dental pain  Musculoskeletal:  Positive for back pain (chronic lumbar back pain).  All other systems reviewed and are negative.    Objective:     BP 98/62   Pulse 74   Ht 5' 6" (1.676 m)   Wt 136 lb 9.6 oz (62 kg)   SpO2 93%   BMI 22.05 kg/m  BP Readings from Last 3 Encounters:  06/18/22 98/62  05/21/22 128/72  04/20/22 132/84   Physical Exam Constitutional:      General: She is not in acute distress.    Appearance: Normal appearance. She is not toxic-appearing.     Comments: Appears older than stated age  HENT:     Head: Normocephalic and atraumatic.     Right Ear: External ear normal.     Left Ear: External ear normal.     Nose: Nose normal. No congestion or rhinorrhea.     Mouth/Throat:     Mouth: Mucous membranes are moist.     Pharynx: Posterior oropharyngeal erythema present. No oropharyngeal exudate.     Comments: Poor dentition.  Numerous dental caries.  There is erythema present along the lower gumline   Eyes:     General: No scleral icterus.  Extraocular Movements: Extraocular movements intact.     Conjunctiva/sclera: Conjunctivae normal.     Pupils: Pupils are equal, round, and reactive to light.  Cardiovascular:     Rate and Rhythm: Normal rate and regular rhythm.     Pulses: Normal pulses.     Heart sounds: Normal heart sounds. No murmur heard.    No friction rub. No gallop.  Pulmonary:     Effort: Pulmonary effort is normal.     Breath sounds: Normal breath sounds. No wheezing, rhonchi or rales.  Abdominal:     General: Abdomen is flat. Bowel sounds are normal. There is no distension.     Palpations: Abdomen is soft.     Tenderness: There is no abdominal tenderness.  Musculoskeletal:        General: No swelling.     Cervical back: Normal range of motion.     Right lower leg: No edema.     Left lower leg: No edema.  Lymphadenopathy:     Cervical: No cervical  adenopathy.  Skin:    General: Skin is warm and dry.     Capillary Refill: Capillary refill takes less than 2 seconds.     Coloration: Skin is not jaundiced.  Neurological:     General: No focal deficit present.     Mental Status: She is alert and oriented to person, place, and time.  Psychiatric:        Mood and Affect: Mood normal.        Behavior: Behavior normal.    Last CBC Lab Results  Component Value Date   WBC 8.6 02/14/2022   HGB 15.5 02/14/2022   HCT 45.1 02/14/2022   MCV 95 02/14/2022   MCH 32.6 02/14/2022   RDW 13.7 02/14/2022   PLT 307 40/34/7425   Last metabolic panel Lab Results  Component Value Date   GLUCOSE 77 06/18/2022   NA 138 06/18/2022   K 5.1 06/18/2022   CL 98 06/18/2022   CO2 24 06/18/2022   BUN 10 06/18/2022   CREATININE 0.83 06/18/2022   EGFR 78 06/18/2022   CALCIUM 9.7 06/18/2022   PROT 7.6 06/18/2022   ALBUMIN 4.0 06/18/2022   LABGLOB 3.6 06/18/2022   AGRATIO 1.1 (L) 06/18/2022   BILITOT 0.2 06/18/2022   ALKPHOS 76 06/18/2022   AST 17 06/18/2022   ALT 10 06/18/2022   ANIONGAP 10 12/24/2018   Last lipids Lab Results  Component Value Date   CHOL 145 02/14/2022   HDL 47 02/14/2022   LDLCALC 80 02/14/2022   TRIG 94 02/14/2022   CHOLHDL 3.1 02/14/2022   Last hemoglobin A1c Lab Results  Component Value Date   HGBA1C 5.6 05/21/2022   Last thyroid functions Lab Results  Component Value Date   TSH 3.290 02/14/2022   T4TOTAL 6.3 02/14/2022   Last vitamin D Lab Results  Component Value Date   VD25OH 16.5 (L) 05/21/2022     Assessment & Plan:   Problem List Items Addressed This Visit       Osteoporosis    DEXA scan completed 10/16.  T score -2.7, meeting criteria for osteoporosis.  His vitamin D level on 10/2 was 16.5.  She is currently on weekly, high-dose supplementation. -Repeat vitamin D level following completion of high-dose supplementation. -Discussed further treatment options pending adequate treatment of  vitamin D deficiency.      Acute oral pain - Primary    Today she endorses acute dental pain.  She states that she has recently  completed antibiotics for treatment of a dental infection.  She has follow-up in 2 weeks to discuss tooth extraction.  She is currently managing her pain with Tylenol, which has been effective.  On inspection of her oral cavity, she has poor dentition with multiple dental caries and erythema across her lower gumline. -I have prescribed oxycodone 5 mg x 5 tablets for as needed use for severe pain.  She will follow-up with her dentist in 2 weeks.      Hyperkalemia    K+ 5.5 on labs from 10/2.  Repeat labs ordered today.       Return in about 4 weeks (around 07/16/2022).    Johnette Abraham, MD

## 2022-06-18 NOTE — Patient Instructions (Signed)
It was a pleasure to see you today.  Thank you for giving [place] the opportunity to be involved in your care.  Below is a brief recap of your visit and next steps.  We will plan to see you again in 4 weeks.  Summary I have prescribed oxycodone 5 mg for severe pain as needed for your dental pain We will check your chemistry panel today to make sure your potassium level has improved Follow up in 4 weeks to discuss next steps with osteoporosis treatment

## 2022-06-19 ENCOUNTER — Other Ambulatory Visit (HOSPITAL_COMMUNITY): Payer: Self-pay

## 2022-06-19 LAB — CMP14+EGFR
ALT: 10 IU/L (ref 0–32)
AST: 17 IU/L (ref 0–40)
Albumin/Globulin Ratio: 1.1 — ABNORMAL LOW (ref 1.2–2.2)
Albumin: 4 g/dL (ref 3.9–4.9)
Alkaline Phosphatase: 76 IU/L (ref 44–121)
BUN/Creatinine Ratio: 12 (ref 12–28)
BUN: 10 mg/dL (ref 8–27)
Bilirubin Total: 0.2 mg/dL (ref 0.0–1.2)
CO2: 24 mmol/L (ref 20–29)
Calcium: 9.7 mg/dL (ref 8.7–10.3)
Chloride: 98 mmol/L (ref 96–106)
Creatinine, Ser: 0.83 mg/dL (ref 0.57–1.00)
Globulin, Total: 3.6 g/dL (ref 1.5–4.5)
Glucose: 77 mg/dL (ref 70–99)
Potassium: 5.1 mmol/L (ref 3.5–5.2)
Sodium: 138 mmol/L (ref 134–144)
Total Protein: 7.6 g/dL (ref 6.0–8.5)
eGFR: 78 mL/min/{1.73_m2} (ref >59)

## 2022-06-24 DIAGNOSIS — K1379 Other lesions of oral mucosa: Secondary | ICD-10-CM | POA: Insufficient documentation

## 2022-06-24 DIAGNOSIS — M810 Age-related osteoporosis without current pathological fracture: Secondary | ICD-10-CM | POA: Insufficient documentation

## 2022-06-24 DIAGNOSIS — E875 Hyperkalemia: Secondary | ICD-10-CM | POA: Insufficient documentation

## 2022-06-24 NOTE — Assessment & Plan Note (Signed)
K+ 5.5 on labs from 10/2.  Repeat labs ordered today.

## 2022-06-24 NOTE — Assessment & Plan Note (Signed)
Today she endorses acute dental pain.  She states that she has recently completed antibiotics for treatment of a dental infection.  She has follow-up in 2 weeks to discuss tooth extraction.  She is currently managing her pain with Tylenol, which has been effective.  On inspection of her oral cavity, she has poor dentition with multiple dental caries and erythema across her lower gumline. -I have prescribed oxycodone 5 mg x 5 tablets for as needed use for severe pain.  She will follow-up with her dentist in 2 weeks.

## 2022-06-24 NOTE — Assessment & Plan Note (Addendum)
DEXA scan completed 10/16.  T score -2.7, meeting criteria for osteoporosis.  His vitamin D level on 10/2 was 16.5.  She is currently on weekly, high-dose supplementation. -Repeat vitamin D level following completion of high-dose supplementation. -Discussed further treatment options pending adequate treatment of vitamin D deficiency.

## 2022-06-27 ENCOUNTER — Ambulatory Visit (HOSPITAL_COMMUNITY)
Admission: RE | Admit: 2022-06-27 | Discharge: 2022-06-27 | Disposition: A | Discharge status: Home / Self Care | Payer: Medicare HMO | Source: Ambulatory Visit | Attending: Interventional Cardiology | Admitting: Interventional Cardiology

## 2022-06-27 DIAGNOSIS — I6523 Occlusion and stenosis of bilateral carotid arteries: Secondary | ICD-10-CM | POA: Diagnosis not present

## 2022-07-02 ENCOUNTER — Encounter (HOSPITAL_COMMUNITY): Payer: Medicare HMO

## 2022-07-06 ENCOUNTER — Telehealth: Payer: Self-pay | Admitting: Internal Medicine

## 2022-07-06 NOTE — Telephone Encounter (Signed)
Returned pt call  

## 2022-07-06 NOTE — Telephone Encounter (Signed)
Pt called wanting to know if Dr. [name] received her papers about getting her teeth fixed? Can you please look into this & give her a call?

## 2022-07-09 ENCOUNTER — Other Ambulatory Visit (HOSPITAL_COMMUNITY): Payer: Self-pay

## 2022-07-15 ENCOUNTER — Other Ambulatory Visit (HOSPITAL_COMMUNITY): Payer: Self-pay

## 2022-07-16 ENCOUNTER — Ambulatory Visit (INDEPENDENT_AMBULATORY_CARE_PROVIDER_SITE_OTHER): Payer: Medicare HMO | Admitting: Internal Medicine

## 2022-07-16 ENCOUNTER — Other Ambulatory Visit: Payer: Self-pay

## 2022-07-16 ENCOUNTER — Encounter: Payer: Self-pay | Admitting: Internal Medicine

## 2022-07-16 ENCOUNTER — Other Ambulatory Visit (HOSPITAL_COMMUNITY): Payer: Self-pay

## 2022-07-16 VITALS — BP 115/73 | HR 57 | Ht 66.0 in | Wt 133.0 lb

## 2022-07-16 DIAGNOSIS — M810 Age-related osteoporosis without current pathological fracture: Secondary | ICD-10-CM | POA: Diagnosis not present

## 2022-07-16 DIAGNOSIS — K1379 Other lesions of oral mucosa: Secondary | ICD-10-CM

## 2022-07-16 DIAGNOSIS — I4891 Unspecified atrial fibrillation: Secondary | ICD-10-CM | POA: Diagnosis not present

## 2022-07-16 DIAGNOSIS — I6523 Occlusion and stenosis of bilateral carotid arteries: Secondary | ICD-10-CM

## 2022-07-16 DIAGNOSIS — R413 Other amnesia: Secondary | ICD-10-CM

## 2022-07-16 MED ORDER — DILTIAZEM HCL ER COATED BEADS 300 MG PO CP24
300.0000 mg | ORAL_CAPSULE | Freq: Every day | ORAL | 1 refills | Status: DC
  Filled 2022-07-16: qty 90, fill #0
  Filled 2022-08-08: qty 90, 90d supply, fill #0
  Filled 2022-11-01: qty 90, 90d supply, fill #1

## 2022-07-16 MED ORDER — ROSUVASTATIN CALCIUM 20 MG PO TABS
20.0000 mg | ORAL_TABLET | Freq: Every day | ORAL | 1 refills | Status: DC
  Filled 2022-07-16: qty 90, fill #0
  Filled 2022-08-08: qty 90, 90d supply, fill #0
  Filled 2022-11-01: qty 90, 90d supply, fill #1

## 2022-07-16 MED ORDER — OXYCODONE HCL 5 MG PO TABS: 5.0000 mg | ORAL_TABLET | ORAL | 0 refills | Status: DC | PRN

## 2022-07-16 MED ORDER — OXYCODONE HCL 5 MG PO TABS
5.0000 mg | ORAL_TABLET | ORAL | 0 refills | Status: DC | PRN
  Filled 2022-07-16: qty 5, 1d supply, fill #0

## 2022-07-16 NOTE — Patient Instructions (Signed)
It was a pleasure to see you today.  Thank you for giving [place] the opportunity to be involved in your care.  Below is a brief recap of your visit and next steps.  We will plan to see you again in 4 weeks.  Summary I have refilled your medications today We will follow up in 4 weeks to review your vitamin D level and do a memory screening test

## 2022-07-16 NOTE — Assessment & Plan Note (Addendum)
She continues to have significant dental pain in the setting of numerous dental caries awaiting extraction.  I have previously prescribed oxycodone 5 mg x 5 tablets for as needed use for severe pain.  She requests a refill today. -Oxycodone refilled. PDMP reviewed.  I will complete her surgical risk assessment form as soon as we receive it.

## 2022-07-16 NOTE — Progress Notes (Signed)
Established Patient Office Visit  Subjective   Patient ID: Carrie Case, female    DOB: Dec 19, 1955  Age: 66 y.o. MRN: [id number]  Chief Complaint  Patient presents with   Follow-up    Vit D   Carrie Case returns to care today.  She was last seen by me on 10/30 at which time she endorsed significant dental pain.  I prescribed oxycodone 5 mg x 5 tablets for as needed relief of severe pain.  She plans to undergo extraction of 11 teeth in the near future.  There have been no acute interval events since her last appointment.  Today Carrie Case continues to endorse significant oral pain.  She states that she is awaiting medical clearance before undergoing surgery and reports that her dentist will be sending a medical clearance form to our office.  Her additional concern today is recent memory issues.  She describes recently not remembering that she had been seen by me and her dentist on consecutive days.  This is concerning to her and she is interested in having her memory further evaluated.  Carrie Case currently lives alone.  She continues to cook and clean, pay her bills, manage her medications, and drive without issue.  Past Medical History:  Diagnosis Date   Acute respiratory failure with hypoxia ([place]) 12/22/2018   Tobacco dependence    Past Surgical History:  Procedure Laterality Date   TUBAL LIGATION     Social History   Tobacco Use   Smoking status: Every Day    Packs/day: 0.50    Years: 47.00    Total pack years: 23.50    Types: Cigarettes   Smokeless tobacco: Never   Tobacco comments:    Smoking about 1/2 ppd per patient on 11/01/2021  Vaping Use   Vaping Use: Never used  Substance Use Topics   Alcohol use: No   Drug use: No   Family History  Problem Relation Age of Onset   Hypertension Mother    Hyperlipidemia Mother    Cancer Mother    COPD Mother    Anxiety disorder Mother    Allergies  Allergen Reactions   Ibuprofen Hives   Review of Systems   Constitutional:  Positive for malaise/fatigue.       Dental pain  All other systems reviewed and are negative.     Objective:     BP 115/73   Pulse (!) 57   Ht _0  (1.676 m)   Wt 133 lb (60.3 kg)   SpO2 95%   BMI 21.47 kg/m  BP Readings from Last 3 Encounters:  07/16/22 115/73  06/18/22 98/62  05/21/22 128/72      Physical Exam Vitals reviewed.  Constitutional:      General: She is not in acute distress.    Appearance: Normal appearance. She is not toxic-appearing.  HENT:     Head: Normocephalic and atraumatic.     Right Ear: External ear normal.     Left Ear: External ear normal.     Nose: Nose normal. No congestion or rhinorrhea.     Mouth/Throat:     Mouth: Mucous membranes are moist.     Pharynx: Oropharynx is clear. No oropharyngeal exudate or posterior oropharyngeal erythema.     Comments: Several dental caries present on the lower gumline Eyes:     General: No scleral icterus.    Extraocular Movements: Extraocular movements intact.     Conjunctiva/sclera: Conjunctivae normal.     Pupils: Pupils are  equal, round, and reactive to light.  Cardiovascular:     Rate and Rhythm: Normal rate and regular rhythm.     Pulses: Normal pulses.     Heart sounds: Normal heart sounds. No murmur heard.    No friction rub. No gallop.  Pulmonary:     Effort: Pulmonary effort is normal.     Breath sounds: Normal breath sounds. No wheezing, rhonchi or rales.  Abdominal:     General: Abdomen is flat. Bowel sounds are normal. There is no distension.     Palpations: Abdomen is soft.     Tenderness: There is no abdominal tenderness.  Musculoskeletal:        General: No swelling. Normal range of motion.     Cervical back: Normal range of motion.     Right lower leg: No edema.     Left lower leg: No edema.  Lymphadenopathy:     Cervical: No cervical adenopathy.  Skin:    General: Skin is warm and dry.     Capillary Refill: Capillary refill takes less than 2 seconds.      Coloration: Skin is not jaundiced.  Neurological:     General: No focal deficit present.     Mental Status: She is alert and oriented to person, place, and time.  Psychiatric:        Mood and Affect: Mood normal.        Behavior: Behavior normal.    Last CBC Lab Results  Component Value Date   WBC 8.6 02/14/2022   HGB 15.5 02/14/2022   HCT 45.1 02/14/2022   MCV 95 02/14/2022   MCH 32.6 02/14/2022   RDW 13.7 02/14/2022   PLT 307 77/41/2878   Last metabolic panel Lab Results  Component Value Date   GLUCOSE 77 06/18/2022   NA 138 06/18/2022   K 5.1 06/18/2022   CL 98 06/18/2022   CO2 24 06/18/2022   BUN 10 06/18/2022   CREATININE 0.83 06/18/2022   EGFR 78 06/18/2022   CALCIUM 9.7 06/18/2022   PROT 7.6 06/18/2022   ALBUMIN 4.0 06/18/2022   LABGLOB 3.6 06/18/2022   AGRATIO 1.1 (L) 06/18/2022   BILITOT 0.2 06/18/2022   ALKPHOS 76 06/18/2022   AST 17 06/18/2022   ALT 10 06/18/2022   ANIONGAP 10 12/24/2018   Last lipids Lab Results  Component Value Date   CHOL 145 02/14/2022   HDL 47 02/14/2022   LDLCALC 80 02/14/2022   TRIG 94 02/14/2022   CHOLHDL 3.1 02/14/2022   Last hemoglobin A1c Lab Results  Component Value Date   HGBA1C 5.6 05/21/2022   Last thyroid functions Lab Results  Component Value Date   TSH 3.290 02/14/2022   T4TOTAL 6.3 02/14/2022   Last vitamin D Lab Results  Component Value Date   VD25OH 16.5 (L) 05/21/2022     Assessment & Plan:   Problem List Items Addressed This Visit       Osteoporosis - Primary    She will complete 12-week treatment with high dose vitamin D supplementation in 3 weeks. We will repeat her vitamin D level at that time and then discuss additional treatment options for osteoporosis.      Acute oral pain    She continues to have significant dental pain in the setting of numerous dental caries awaiting extraction.  I have previously prescribed oxycodone 5 mg x 5 tablets for as needed use for severe pain.  She  requests a refill today. -Oxycodone refilled. PDMP reviewed.  I will complete her surgical risk assessment form as soon as we receive it.      Memory changes    Today she expresses concern over memory impairment, noting that she recently forgot seeing myself and her dentist on consecutive days. -Plan for MoCA assessment at her follow-up appointment in 4 weeks.      Return in about 4 weeks (around 08/13/2022) for Vit D, Memory Concerns.    Johnette Abraham, MD

## 2022-07-16 NOTE — Assessment & Plan Note (Signed)
Today she expresses concern over memory impairment, noting that she recently forgot seeing myself and her dentist on consecutive days. -Plan for MoCA assessment at her follow-up appointment in 4 weeks.

## 2022-07-16 NOTE — Assessment & Plan Note (Signed)
She will complete 12-week treatment with high dose vitamin D supplementation in 3 weeks. We will repeat her vitamin D level at that time and then discuss additional treatment options for osteoporosis.

## 2022-07-17 ENCOUNTER — Other Ambulatory Visit (HOSPITAL_COMMUNITY): Payer: Self-pay

## 2022-07-23 ENCOUNTER — Ambulatory Visit: Payer: Medicare HMO | Admitting: Internal Medicine

## 2022-07-23 NOTE — Progress Notes (Deleted)
Carrie Case, female    DOB: 1956-07-10  MRN: [id number]   Brief patient profile:  9  yowf  active smoker  referred to pulmonary clinic in [place]  11/01/2021 by Dr Dolphus Jenny for doe.    History of Present Illness  11/01/2021  Pulmonary/ 1st office eval/ Vaidehi Braddy / [place] [facility]  Chief Complaint  Patient presents with   Consult    Referred by Dr. [name] for Chronic bronchitis and COPD   Dyspnea:  MMRC2 = can't walk a nl pace on a flat grade s sob but does fine slow and flat no better on spiriva /some better on neb  Cough: none  Sleep: 3-4 x per week 30-45 degrees due to habit  SABA use: neb twice daily on avg - typically p exertion  Rec Plan A = Automatic = Always=    Spiriva one capsule each am  Plan B = Backup (to supplement plan A, not to replace it) Only use your albuterol inhaler  Plan C = Crisis (instead of Plan B but only if Plan B stops working) - only use your albuterol nebulizer if you first try Plan B   The key is to stop smoking completely before smoking completely stops you!  Please schedule a follow up visit in 3 months but call sooner if needed with pfts on return    04/20/2022  f/u ov/[place] office/Kimberely Mccannon re: GOLD 2 maint on spiriva   Chief Complaint  Patient presents with   Follow-up    Breathing is same since last ov   Dyspnea:  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, some better with spiriva dpi  Cough: none  Sleeping: still sleep recliner  SABA use: never neb/ rarely hfa  02: none  Covid status: never vax, never infected  Lung cancer screening: rec  Rec My office will be contacting you by phone for referral to lung cancer screening program   Plan A = Automatic = Always=    spiriva respimat (or stiolto) 2 pffs 1st thing in am Work on inhaler technique:   Plan B = Backup (to supplement plan A, not to replace it) Only use your albuterol inhaler as a rescue medication  Plan C = Crisis  (instead of Plan B but only if Plan B stops working) - only use your albuterol nebulizer if you first try Plan B  The key is to stop smoking completely before smoking completely stops you!    07/23/2022  f/u ov/[place] office/Jeramia Saleeby re: *** maint on ***  No chief complaint on file.   Dyspnea:  *** Cough: *** Sleeping: *** SABA use: *** 02: *** Covid status: *** Lung cancer screening: ***   No obvious day to day or daytime variability or assoc excess/ purulent sputum or mucus plugs or hemoptysis or cp or chest tightness, subjective wheeze or overt sinus or hb symptoms.   *** without nocturnal  or early am exacerbation  of respiratory  c/o's or need for noct saba. Also denies any obvious fluctuation of symptoms with weather or environmental changes or other aggravating or alleviating factors except as outlined above   No unusual exposure hx or h/o childhood pna/ asthma or knowledge of premature birth.  Current Allergies, Complete Past Medical History, Past Surgical History, Family History, and Social History were reviewed in [employer] record.  ROS  The following are not active complaints unless bolded Hoarseness, sore throat, dysphagia, dental problems,  itching, sneezing,  nasal congestion or discharge of excess mucus or purulent secretions, ear ache,   fever, chills, sweats, unintended wt loss or wt gain, classically pleuritic or exertional cp,  orthopnea pnd or arm/hand swelling  or leg swelling, presyncope, palpitations, abdominal pain, anorexia, nausea, vomiting, diarrhea  or change in bowel habits or change in bladder habits, change in stools or change in urine, dysuria, hematuria,  rash, arthralgias, visual complaints, headache, numbness, weakness or ataxia or problems with walking or coordination,  change in mood or  memory.        No outpatient medications have been marked as taking for the 07/23/22 encounter (Appointment) with Nyoka Cowden, MD.                      Past Medical History:  Diagnosis Date   Acute respiratory failure with hypoxia ([facility]) 12/22/2018   Tobacco dependence        Objective:    Wts   07/23/2022        ***  04/20/2022         131   02/14/22 132 lb 6.4 oz (60.1 kg)  11/01/21 137 lb (62.1 kg)  08/29/21 139 lb (63 kg)    Vital signs reviewed  07/23/2022  - Note at rest 02 sats  ***% on ***   General appearance:    ***      Mild bar***        Assessment

## 2022-08-06 DIAGNOSIS — H2513 Age-related nuclear cataract, bilateral: Secondary | ICD-10-CM | POA: Diagnosis not present

## 2022-08-06 DIAGNOSIS — H02834 Dermatochalasis of left upper eyelid: Secondary | ICD-10-CM | POA: Diagnosis not present

## 2022-08-06 DIAGNOSIS — H02831 Dermatochalasis of right upper eyelid: Secondary | ICD-10-CM | POA: Diagnosis not present

## 2022-08-06 DIAGNOSIS — H01001 Unspecified blepharitis right upper eyelid: Secondary | ICD-10-CM | POA: Diagnosis not present

## 2022-08-08 ENCOUNTER — Other Ambulatory Visit: Payer: Self-pay

## 2022-08-08 ENCOUNTER — Other Ambulatory Visit (HOSPITAL_COMMUNITY): Payer: Self-pay

## 2022-08-09 ENCOUNTER — Other Ambulatory Visit: Payer: Self-pay

## 2022-08-09 ENCOUNTER — Other Ambulatory Visit (HOSPITAL_COMMUNITY): Payer: Self-pay

## 2022-08-16 ENCOUNTER — Encounter: Payer: Self-pay | Admitting: Internal Medicine

## 2022-08-16 ENCOUNTER — Ambulatory Visit (INDEPENDENT_AMBULATORY_CARE_PROVIDER_SITE_OTHER): Payer: Medicare HMO | Admitting: Internal Medicine

## 2022-08-16 VITALS — BP 99/72 | HR 90 | Resp 16 | Ht 66.0 in | Wt 134.0 lb

## 2022-08-16 DIAGNOSIS — R413 Other amnesia: Secondary | ICD-10-CM

## 2022-08-16 DIAGNOSIS — M810 Age-related osteoporosis without current pathological fracture: Secondary | ICD-10-CM

## 2022-08-16 DIAGNOSIS — E559 Vitamin D deficiency, unspecified: Secondary | ICD-10-CM

## 2022-08-16 NOTE — Progress Notes (Signed)
Established Patient Office Visit  Subjective   Patient ID: Carrie Case, female    DOB: 02-24-1956  Age: 66 y.o. MRN: [id number]  Chief Complaint  Patient presents with   Memory Loss    4 week follow up    COPD   [name] returns to care today.  She was last seen by me on 11/27 which time she continued endorsed significant dental pain.  Oxycodone was refilled.  4-week follow-up was arranged to address memory concerns and to repeat her vitamin D level in the setting of osteoporosis.  There have been no acute interval events.  Today [name] reports multiple falls on 1 day 2 weeks ago.  She did not hit her head and there was no LOC.  Falls were described as mechanical in nature.  She has not experienced symptoms of dizziness or lightheadedness.  [name] will undergo cataract surgery on 1/8.  She returns to care today for memory evaluation.  She states that she has difficulty with short-term recall.  [name] states that she is told things multiple times but cannot remember.  She will go to the grocery store to purchase groceries but later in the day would not recall going to the grocery store.  She has gotten multiple arguments with her family because they will tell her things that she is unable to remember.  [name] continues to live by herself.  She manages her own finances, cooks, cleans, showers, and dresses herself independently.  She drives to the grocery store but otherwise avoids driving significant distances.  Past Medical History:  Diagnosis Date   Acute respiratory failure with hypoxia ([facility]) 12/22/2018   Tobacco dependence    Past Surgical History:  Procedure Laterality Date   TUBAL LIGATION     Social History   Tobacco Use   Smoking status: Every Day    Packs/day: 0.50    Years: 47.00    Total pack years: 23.50    Types: Cigarettes   Smokeless tobacco: Never   Tobacco comments:    Smoking about 1/2 ppd per patient on 11/01/2021  Vaping Use   Vaping  Use: Never used  Substance Use Topics   Alcohol use: No   Drug use: No   Family History  Problem Relation Age of Onset   Hypertension Mother    Hyperlipidemia Mother    Cancer Mother    COPD Mother    Anxiety disorder Mother    Allergies  Allergen Reactions   Ibuprofen Hives   Review of Systems  Constitutional:  Negative for chills and fever.  HENT:  Negative for sore throat.   Respiratory:  Negative for cough and shortness of breath.   Cardiovascular:  Negative for chest pain, palpitations and leg swelling.  Gastrointestinal:  Negative for abdominal pain, blood in stool, constipation, diarrhea, nausea and vomiting.  Genitourinary:  Negative for dysuria and hematuria.  Musculoskeletal:  Negative for myalgias.  Skin:  Negative for itching and rash.  Neurological:  Negative for dizziness and headaches.  Psychiatric/Behavioral:  Positive for memory loss. Negative for depression and suicidal ideas.      Objective:     BP 99/72   Pulse 90   Resp 16   Ht 5' 6" (1.676 m)   Wt 134 lb (60.8 kg)   SpO2 (!) 87%   BMI 21.63 kg/m  BP Readings from Last 3 Encounters:  08/16/22 99/72  07/16/22 115/73  06/18/22 98/62   Physical Exam Vitals reviewed.  Constitutional:      General: She is not in acute distress.    Appearance: Normal appearance. She is not toxic-appearing.  HENT:     Head: Normocephalic and atraumatic.     Right Ear: External ear normal.     Left Ear: External ear normal.     Nose: Nose normal. No congestion or rhinorrhea.     Mouth/Throat:     Mouth: Mucous membranes are moist.     Pharynx: Oropharynx is clear. No oropharyngeal exudate or posterior oropharyngeal erythema.     Comments: Multiple dental caries present on lower gumline Eyes:     General: No scleral icterus.    Extraocular Movements: Extraocular movements intact.     Conjunctiva/sclera: Conjunctivae normal.     Pupils: Pupils are equal, round, and reactive to light.  Cardiovascular:      Rate and Rhythm: Normal rate and regular rhythm.     Pulses: Normal pulses.     Heart sounds: Normal heart sounds. No murmur heard.    No friction rub. No gallop.  Pulmonary:     Effort: Pulmonary effort is normal.     Breath sounds: Normal breath sounds. No wheezing, rhonchi or rales.  Abdominal:     General: Abdomen is flat. Bowel sounds are normal. There is no distension.     Palpations: Abdomen is soft.     Tenderness: There is no abdominal tenderness.  Musculoskeletal:        General: No swelling. Normal range of motion.     Cervical back: Normal range of motion.     Right lower leg: No edema.     Left lower leg: No edema.  Lymphadenopathy:     Cervical: No cervical adenopathy.  Skin:    General: Skin is warm and dry.     Capillary Refill: Capillary refill takes less than 2 seconds.     Coloration: Skin is not jaundiced.  Neurological:     General: No focal deficit present.     Mental Status: She is alert and oriented to person, place, and time.  Psychiatric:        Mood and Affect: Mood normal.        Behavior: Behavior normal.    Last CBC Lab Results  Component Value Date   WBC 8.6 02/14/2022   HGB 15.5 02/14/2022   HCT 45.1 02/14/2022   MCV 95 02/14/2022   MCH 32.6 02/14/2022   RDW 13.7 02/14/2022   PLT 307 51/09/5850   Last metabolic panel Lab Results  Component Value Date   GLUCOSE 77 06/18/2022   NA 138 06/18/2022   K 5.1 06/18/2022   CL 98 06/18/2022   CO2 24 06/18/2022   BUN 10 06/18/2022   CREATININE 0.83 06/18/2022   EGFR 78 06/18/2022   CALCIUM 9.7 06/18/2022   PROT 7.6 06/18/2022   ALBUMIN 4.0 06/18/2022   LABGLOB 3.6 06/18/2022   AGRATIO 1.1 (L) 06/18/2022   BILITOT 0.2 06/18/2022   ALKPHOS 76 06/18/2022   AST 17 06/18/2022   ALT 10 06/18/2022   ANIONGAP 10 12/24/2018   Last lipids Lab Results  Component Value Date   CHOL 145 02/14/2022   HDL 47 02/14/2022   LDLCALC 80 02/14/2022   TRIG 94 02/14/2022   CHOLHDL 3.1 02/14/2022    Last hemoglobin A1c Lab Results  Component Value Date   HGBA1C 5.6 05/21/2022   Last thyroid functions Lab Results  Component Value Date   TSH 3.290 02/14/2022   T4TOTAL  6.3 02/14/2022   Last vitamin D Lab Results  Component Value Date   VD25OH 60.3 08/16/2022     Assessment & Plan:   Problem List Items Addressed This Visit       Osteoporosis    She has completed high-dose, weekly vitamin D supplementation.  Repeat vitamin D level ordered today.  Can discuss additional treatment options for osteoporosis pending results.  Will recommend continued vitamin D/calcium supplementation.      Memory changes    Symptoms described as above.  She is concerned about short-term memory issues.  MoCA assessment completed today and was normal (28/30).  We reviewed the importance of regular exercise, engaging in cognitive activities such as crossword puzzles, word searches, reading, and staying socializing with others.      Return in about 3 months (around 11/15/2022).    Johnette Abraham, MD

## 2022-08-16 NOTE — Patient Instructions (Signed)
It was a pleasure to see you today.  Thank you for giving [place] the opportunity to be involved in your care.  Below is a brief recap of your visit and next steps.  We will plan to see you again in 3 months.  Summary NO medication changes today I will notify you of the vitamin D result We will follow up in 3 months

## 2022-08-17 LAB — VITAMIN D 25 HYDROXY (VIT D DEFICIENCY, FRACTURES): Vit D, 25-Hydroxy: 60.3 ng/mL (ref 30.0–100.0)

## 2022-08-21 NOTE — H&P (Signed)
Surgical History & Physical  Patient Name: Carrie Case DOB: 1955-11-19  Surgery: Cataract extraction with intraocular lens implant phacoemulsification; Right Eye  Surgeon: Baruch Goldmann MD Surgery Date:  08-27-22 Pre-Op Date:  08-06-22  HPI: A 11 Yr. old female patient 1. 1. The patient complains of difficulty when driving due to glare from headlights or sun, which began 3 years ago, has worsened over time. Both eyes are affected. The episode is constant. This is negatively affecting the patient's quality of life and the patient is unable to function adequately in life with the current level of vision. HPI Completed by Dr. Baruch Goldmann  Medical History: Cataracts Heart Problem High Blood Pressure LDL Lung Problems  Review of Systems Cardiovascular High Blood Pressure, A-Fib Respiratory COPD, Emphysema All recorded systems are negative except as noted above.  Social   Current every day smoker / Cigarettes   Medication  Cyclobenzaprine, Diltiazem, Rosuvastatin, Albuterol, Aspirin, Stiolto respimat, Vitamin D,   Sx/Procedures Tubal Ligation,   Drug Allergies  Ibuprofen,   History & Physical: Heent: cataract, right eye NECK: supple without bruits LUNGS: lungs clear to auscultation CV: regular rate and rhythm Abdomen: soft and non-tender Impression & Plan: Assessment: 1.  NUCLEAR SCLEROSIS AGE RELATED; Both Eyes (H25.13) 2.  DERMATOCHALASIS, no surgery; Right Upper Lid, Left Upper Lid (H02.831, H02.834) 3.  BLEPHARITIS; Right Upper Lid, Right Lower Lid, Left Upper Lid, Left Lower Lid (H01.001, H01.002,H01.004,H01.005)  Plan: 1.  Cataract accounts for the patient's decreased vision. This visual impairment is not correctable with a tolerable change in glasses or contact lenses. Cataract surgery with an implantation of a new lens should significantly improve the visual and functional status of the patient. Discussed all risks, benefits, alternatives, and potential  complications. Discussed the procedures and recovery. Patient desires to have surgery. A-scan ordered and performed today for intra-ocular lens calculations. The surgery will be performed in order to improve vision for driving, reading, and for eye examinations. Recommend phacoemulsification with intra-ocular lens. Recommend Dextenza for post-operative pain and inflammation. Right Eye. Dilates well - shugarcaine by protocol.  2.  Asymptomatic, recommend observation for now. Findings, prognosis and treatment options reviewed.  3.  Recommend regular lid cleaning.

## 2022-08-22 ENCOUNTER — Encounter (HOSPITAL_COMMUNITY)
Admission: RE | Admit: 2022-08-22 | Discharge: 2022-08-22 | Disposition: A | Discharge status: Home / Self Care | Payer: Medicare HMO | Source: Ambulatory Visit | Attending: Ophthalmology | Admitting: Ophthalmology

## 2022-08-22 NOTE — Pre-Procedure Instructions (Signed)
Attempted pre-op phone call. Patient states she needs to postpone her surgery. She is on her way to [place] for a funeral. Mackie Pai at Dr Bernarda Caffey office notified.

## 2022-08-22 NOTE — Assessment & Plan Note (Signed)
Symptoms described as above.  She is concerned about short-term memory issues.  MoCA assessment completed today and was normal (28/30).  We reviewed the importance of regular exercise, engaging in cognitive activities such as crossword puzzles, word searches, reading, and staying socializing with others.

## 2022-08-22 NOTE — Assessment & Plan Note (Signed)
She has completed high-dose, weekly vitamin D supplementation.  Repeat vitamin D level ordered today.  Can discuss additional treatment options for osteoporosis pending results.  Will recommend continued vitamin D/calcium supplementation.

## 2022-08-27 ENCOUNTER — Encounter (HOSPITAL_COMMUNITY): Admission: RE | Payer: Self-pay | Source: Home / Self Care

## 2022-08-27 ENCOUNTER — Ambulatory Visit (HOSPITAL_COMMUNITY): Admission: RE | Admit: 2022-08-27 | Payer: Medicare HMO | Source: Home / Self Care | Admitting: Ophthalmology

## 2022-08-27 SURGERY — PHACOEMULSIFICATION, CATARACT, WITH IOL INSERTION
Anesthesia: Monitor Anesthesia Care | Laterality: Right

## 2022-08-31 ENCOUNTER — Other Ambulatory Visit: Payer: Self-pay

## 2022-09-04 ENCOUNTER — Ambulatory Visit: Payer: Medicare HMO | Admitting: Cardiovascular Disease

## 2022-09-13 ENCOUNTER — Other Ambulatory Visit: Payer: Self-pay

## 2022-10-02 ENCOUNTER — Ambulatory Visit: Payer: Medicare HMO | Attending: Cardiovascular Disease | Admitting: Cardiovascular Disease

## 2022-10-02 ENCOUNTER — Encounter: Payer: Self-pay | Admitting: Cardiovascular Disease

## 2022-10-02 VITALS — BP 111/78 | HR 103 | Ht 66.0 in | Wt 129.2 lb

## 2022-10-02 DIAGNOSIS — I771 Stricture of artery: Secondary | ICD-10-CM | POA: Diagnosis not present

## 2022-10-02 DIAGNOSIS — I739 Peripheral vascular disease, unspecified: Secondary | ICD-10-CM | POA: Diagnosis not present

## 2022-10-02 DIAGNOSIS — E785 Hyperlipidemia, unspecified: Secondary | ICD-10-CM | POA: Diagnosis not present

## 2022-10-02 DIAGNOSIS — Z720 Tobacco use: Secondary | ICD-10-CM | POA: Diagnosis not present

## 2022-10-02 DIAGNOSIS — I480 Paroxysmal atrial fibrillation: Secondary | ICD-10-CM | POA: Diagnosis not present

## 2022-10-02 MED ORDER — APIXABAN 5 MG PO TABS: 5.0000 mg | ORAL_TABLET | Freq: Two times a day (BID) | ORAL | 11 refills | Status: DC

## 2022-10-02 NOTE — Progress Notes (Signed)
Cardiology Office Note   Date:  10/02/2022   ID:  Carrie Case, DOB 11-Feb-1956, MRN [id number]  PCP:  Johnette Abraham, MD  Cardiologist:  Dr. Irish Lack  No chief complaint on file.     History of Present Illness: Carrie Case is a 68 y.o. female who is here today regarding peripheral arterial disease and asymptomatic left subclavian artery stenosis. She has known history of paroxysmal atrial fibrillation, prolonged history of tobacco use and hyperlipidemia. She has been smoking for more than 50 years. She had carotid artery duplex done in October 2022 which showed moderate left carotid stenosis.  There was incidental finding of left subclavian artery stenosis with bidirectional flow in the left vertebral artery and 32 mm blood pressure difference between the right arm and the left arm.  In spite of that, the patient denies any left arm claudication and has no neurologic symptoms with using the left arm. She also is known history of peripheral arterial disease with occluded right popliteal artery.  Her symptoms were not lifestyle limiting and she has been managed medically. She is limited mostly by shortness of breath related to COPD.  She is cutting down on tobacco use and planning to quit. She is noted to be in atrial fibrillation today but does not seem to be symptomatic from this.   Past Medical History:  Diagnosis Date   Acute respiratory failure with hypoxia ([place]) 12/22/2018   Tobacco dependence     Past Surgical History:  Procedure Laterality Date   TUBAL LIGATION       Current Outpatient Medications  Medication Sig Dispense Refill   acetaminophen-codeine (TYLENOL #3) 300-30 MG tablet Take 1 tablet by mouth every 6 (six) hours as needed.     albuterol (PROVENTIL) (2.5 MG/3ML) 0.083% nebulizer solution TAKE 3 MLS (2.5 MG TOTAL) BY NEBULIZATION EVERY 6 (SIX) HOURS AS NEEDED FOR WHEEZING OR SHORTNESS OF BREATH. 90 mL 2   aspirin EC 81 MG tablet Take 1 tablet (81 mg  total) by mouth daily. 90 tablet 3   diltiazem (CARTIA XT) 300 MG 24 hr capsule Take 1 capsule (300 mg total) by mouth daily. 90 capsule 1   oxyCODONE (ROXICODONE) 5 MG immediate release tablet Take 1 tablet (5 mg total) by mouth every 4 (four) hours as needed for up to 5 doses for severe pain. 5 tablet 0   rosuvastatin (CRESTOR) 20 MG tablet Take 1 tablet (20 mg total) by mouth daily. 90 tablet 1   Tiotropium Bromide-Olodaterol (STIOLTO RESPIMAT) 2.5-2.5 MCG/ACT AERS Inhale 2 puffs by mouth every morning. 12 g 11   albuterol (VENTOLIN HFA) 108 (90 Base) MCG/ACT inhaler INHALE 1-2 PUFFS INTO THE LUNGS EVERY 6 (SIX) HOURS AS NEEDED FOR WHEEZING OR SHORTNESS OF BREATH. 18 g 1   No current facility-administered medications for this visit.    Allergies:   Ibuprofen    Social History:  The patient  reports that she has been smoking cigarettes. She has a 23.50 pack-year smoking history. She has never used smokeless tobacco. She reports that she does not drink alcohol and does not use drugs.   Family History:  The patient's family history includes Anxiety disorder in her mother; COPD in her mother; Cancer in her mother; Hyperlipidemia in her mother; Hypertension in her mother.    ROS:  Please see the history of present illness.   Otherwise, review of systems are positive for none.   All other systems are reviewed and negative.  PHYSICAL EXAM: VS:  BP 111/78   Pulse (!) 103   Ht 5' 6"$  (1.676 m)   Wt 129 lb 3.2 oz (58.6 kg)   SpO2 95%   BMI 20.85 kg/m  , BMI Body mass index is 20.85 kg/m. GEN: Well nourished, well developed, in no acute distress  HEENT: normal  Neck: no JVD, carotid bruits, or masses Cardiac: Irregularly irregular; no murmurs, rubs, or gallops,no edema  Respiratory:  clear to auscultation bilaterally, normal work of breathing GI: soft, nontender, nondistended, + BS MS: no deformity or atrophy  Skin: warm and dry, no rash Neuro:  Strength and sensation are  intact Psych: euthymic mood, full affect Vascular: Radial pulse: +2 on the right and very faint on the left.  Femoral: +2 bilaterally, dorsalis pedis is not palpable bilaterally.  Posterior tibial is faint but palpable on the left and not the right.   EKG:  EKG is ordered today. EKG showed atrial fibrillation with ventricular rate of 103 bpm.   Recent Labs: 02/14/2022: Hemoglobin 15.5; Platelets 307; TSH 3.290 06/18/2022: ALT 10; BUN 10; Creatinine, Ser 0.83; Potassium 5.1; Sodium 138    Lipid Panel    Component Value Date/Time   CHOL 145 02/14/2022 1645   TRIG 94 02/14/2022 1645   HDL 47 02/14/2022 1645   CHOLHDL 3.1 02/14/2022 1645   CHOLHDL 4.8 12/23/2018 0633   VLDL 38 12/23/2018 0633   LDLCALC 80 02/14/2022 1645      Wt Readings from Last 3 Encounters:  10/02/22 129 lb 3.2 oz (58.6 kg)  08/16/22 134 lb (60.8 kg)  07/16/22 133 lb (60.3 kg)          ASSESSMENT AND PLAN:  1.  Left subclavian artery stenosis: The patient has evidence of left subclavian artery stenosis.  She continues to deny left arm claudication.  Recommend medical therapy.  Recommend checking blood pressure in the right arm and not the left.   2.  Peripheral arterial disease with claudication worse on the right side.  Mild to moderate right calf claudication due to an occluded popliteal artery.  No indication for revascularization at the present time.  I discussed with her the importance of a walking exercise program and also the importance of smoking cessation.    3.  Tobacco use: I again discussed the importance of smoking cessation.     4.  Paroxysmal atrial fibrillation: She is noted to be in atrial fibrillation today but she appears to be asymptomatic.  Her CHA2DS2-VASc score is 3.  Thus, I recommend long-term anticoagulation and started Eliquis 5 mg twice daily.  Discontinue she should keep her follow-up appointment with Dr. Harl Bowie next month. Given lack of symptoms, I think it is reasonable to  continue with rate control.   5.  Hyperlipidemia: Continue treatment with rosuvastatin with a target LDL of less than 70.    Disposition:   FU with me in 9 months   Signed,  Kathlyn Sacramento, MD  10/02/2022 11:48 AM    [place]

## 2022-10-02 NOTE — Patient Instructions (Signed)
Medication Instructions:  STOP the Aspirin  START the Eliquis 61m twice daily  *If you need a refill on your cardiac medications before your next appointment, please call your pharmacy*   Lab Work: None ordered If you have labs (blood work) drawn today and your tests are completely normal, you will receive your results only by: [place](if you have MyChart) OR A paper copy in the mail If you have any lab test that is abnormal or we need to change your treatment, we will call you to review the results.   Testing/Procedures: Your physician has requested that you have an echocardiogram. Echocardiography is a painless test that uses sound waves to create images of your heart. It provides your doctor with information about the size and shape of your heart and how well your heart's chambers and valves are working. You may receive an ultrasound enhancing agent through an IV if needed to better visualize your heart during the echo.This procedure takes approximately one hour. There are no restrictions for this procedure.     Follow-Up: [facility] you and your health needs are our priority.  As part of our continuing mission to provide you with exceptional heart care, we have created designated Provider Care Teams.  These Care Teams include your primary Cardiologist (physician) and Advanced Practice Providers (APPs -  Physician Assistants and Nurse Practitioners) who all work together to provide you with the care you need, when you need it.  We recommend signing up for the patient portal called "MyChart".  Sign up information is provided on this After Visit Summary.  MyChart is used to connect with patients for Virtual Visits (Telemedicine).  Patients are able to view lab/test results, encounter notes, upcoming appointments, etc.  Non-urgent messages can be sent to your provider as well.   To learn more about what you can do with MyChart, go to [email or web address]    Your  next appointment:   9 month(s)  Provider:   Dr. AFletcher Anon

## 2022-10-10 ENCOUNTER — Other Ambulatory Visit (HOSPITAL_COMMUNITY): Payer: Self-pay

## 2022-10-17 ENCOUNTER — Other Ambulatory Visit: Payer: Medicare HMO

## 2022-10-19 ENCOUNTER — Other Ambulatory Visit (HOSPITAL_COMMUNITY): Payer: Self-pay

## 2022-11-01 ENCOUNTER — Other Ambulatory Visit (HOSPITAL_COMMUNITY): Payer: Self-pay

## 2022-11-02 ENCOUNTER — Ambulatory Visit: Payer: Medicare HMO | Admitting: Cardiology

## 2022-11-06 ENCOUNTER — Ambulatory Visit: Payer: Medicare HMO | Attending: Cardiovascular Disease

## 2022-11-06 DIAGNOSIS — I480 Paroxysmal atrial fibrillation: Secondary | ICD-10-CM | POA: Diagnosis not present

## 2022-11-06 LAB — ECHOCARDIOGRAM COMPLETE
AR max vel: 1.34 cm2
AV Peak grad: 9.5 mmHg
AV Vena cont: 0.5 cm
Ao pk vel: 1.54 m/s
Calc EF: 64.2 %
MV M vel: 3.88 m/s
MV Peak grad: 60.3 mmHg
P 1/2 time: 560 msec
S' Lateral: 2.5 cm
Single Plane A2C EF: 62.8 %
Single Plane A4C EF: 64.4 %

## 2022-11-07 ENCOUNTER — Encounter: Payer: Self-pay | Admitting: *Deleted

## 2022-11-07 ENCOUNTER — Other Ambulatory Visit: Payer: Self-pay

## 2022-11-07 ENCOUNTER — Ambulatory Visit: Payer: Medicare HMO | Attending: Cardiology | Admitting: Cardiology

## 2022-11-07 ENCOUNTER — Other Ambulatory Visit (HOSPITAL_COMMUNITY): Payer: Self-pay

## 2022-11-07 ENCOUNTER — Encounter: Payer: Self-pay | Admitting: Cardiology

## 2022-11-07 VITALS — BP 100/78 | HR 65 | Ht 66.0 in | Wt 130.0 lb

## 2022-11-07 DIAGNOSIS — I480 Paroxysmal atrial fibrillation: Secondary | ICD-10-CM

## 2022-11-07 DIAGNOSIS — E782 Mixed hyperlipidemia: Secondary | ICD-10-CM

## 2022-11-07 MED ORDER — RIVAROXABAN 20 MG PO TABS: 20.0000 mg | ORAL_TABLET | Freq: Every day | ORAL | 0 refills | Status: DC

## 2022-11-07 MED ORDER — METOPROLOL TARTRATE 25 MG PO TABS
12.5000 mg | ORAL_TABLET | Freq: Two times a day (BID) | ORAL | 6 refills | Status: DC
  Filled 2022-11-07: qty 30, 30d supply, fill #0
  Filled 2022-12-02: qty 30, 30d supply, fill #1
  Filled 2023-01-01: qty 30, 30d supply, fill #2
  Filled 2023-01-29: qty 30, 30d supply, fill #3

## 2022-11-07 NOTE — Patient Instructions (Signed)
Medication Instructions:  Stop Eliquis Begin Xarelto 20mg  daily - will give samples first to see if can tolerate before sending prescription to the pharmacy  Begin Lopressor (Metoprolol Tart) 25mg  - 1/2 tab (12.5mg ) twice a day   Continue all other medications.     Labwork: none  Testing/Procedures: none  Follow-Up: 3 months   Any Other Special Instructions Will Be Listed Below (If Applicable). 1 week nurse visit for EKG, vitals & update [place] on how she is tolerating the Xarelto   If you need a refill on your cardiac medications before your next appointment, please call your pharmacy.

## 2022-11-07 NOTE — Progress Notes (Signed)
Clinical Summary Ms. Roath is a 67 y.o.female  1.PAF - from notes prior afib was in setting of lung infection, initially was not started on anticoag - at recent f/u 09/2022 noted to be back in afib, eliquis was started.  - reports eliquis has been causing leg pains, cut down to once a day and symptoms improved  - no recent palpitations     2.PAD - followed by Dr Fletcher Anon - history of PAD managed medically, asymptomatic left subclavian stenosis  3. Hyperlipidemia - compliant with crestor - labs coming up next week  4. Carotid stenosis  5. COPD   Past Medical History:  Diagnosis Date   Acute respiratory failure with hypoxia ([facility]) 12/22/2018   Tobacco dependence      Allergies  Allergen Reactions   Ibuprofen Hives     Current Outpatient Medications  Medication Sig Dispense Refill   acetaminophen-codeine (TYLENOL #3) 300-30 MG tablet Take 1 tablet by mouth every 6 (six) hours as needed.     albuterol (PROVENTIL) (2.5 MG/3ML) 0.083% nebulizer solution TAKE 3 MLS (2.5 MG TOTAL) BY NEBULIZATION EVERY 6 (SIX) HOURS AS NEEDED FOR WHEEZING OR SHORTNESS OF BREATH. 90 mL 2   albuterol (VENTOLIN HFA) 108 (90 Base) MCG/ACT inhaler INHALE 1-2 PUFFS INTO THE LUNGS EVERY 6 (SIX) HOURS AS NEEDED FOR WHEEZING OR SHORTNESS OF BREATH. 18 g 1   apixaban (ELIQUIS) 5 MG TABS tablet Take 1 tablet (5 mg total) by mouth 2 (two) times daily. 60 tablet 11   diltiazem (CARTIA XT) 300 MG 24 hr capsule Take 1 capsule (300 mg total) by mouth daily. 90 capsule 1   oxyCODONE (ROXICODONE) 5 MG immediate release tablet Take 1 tablet (5 mg total) by mouth every 4 (four) hours as needed for up to 5 doses for severe pain. 5 tablet 0   rosuvastatin (CRESTOR) 20 MG tablet Take 1 tablet (20 mg total) by mouth daily. 90 tablet 1   Tiotropium Bromide-Olodaterol (STIOLTO RESPIMAT) 2.5-2.5 MCG/ACT AERS Inhale 2 puffs by mouth every morning. 12 g 11   No current facility-administered medications for this  visit.     Past Surgical History:  Procedure Laterality Date   TUBAL LIGATION       Allergies  Allergen Reactions   Ibuprofen Hives      Family History  Problem Relation Age of Onset   Hypertension Mother    Hyperlipidemia Mother    Cancer Mother    COPD Mother    Anxiety disorder Mother      Social History Ms. Routhier reports that she has been smoking cigarettes. She has a 23.50 pack-year smoking history. She has never used smokeless tobacco. Ms. Kesecker reports no history of alcohol use.   Review of Systems CONSTITUTIONAL: No weight loss, fever, chills, weakness or fatigue.  HEENT: Eyes: No visual loss, blurred vision, double vision or yellow sclerae.No hearing loss, sneezing, congestion, runny nose or sore throat.  SKIN: No rash or itching.  CARDIOVASCULAR: per hpi RESPIRATORY: No shortness of breath, cough or sputum.  GASTROINTESTINAL: No anorexia, nausea, vomiting or diarrhea. No abdominal pain or blood.  GENITOURINARY: No burning on urination, no polyuria NEUROLOGICAL: No headache, dizziness, syncope, paralysis, ataxia, numbness or tingling in the extremities. No change in bowel or bladder control.  MUSCULOSKELETAL: No muscle, back pain, joint pain or stiffness.  LYMPHATICS: No enlarged nodes. No history of splenectomy.  PSYCHIATRIC: No history of depression or anxiety.  ENDOCRINOLOGIC: No reports of sweating, cold or heat  intolerance. No polyuria or polydipsia.  Marland Kitchen   Physical Examination Today's Vitals   11/07/22 0834  BP: 100/78  Pulse: 65  SpO2: 96%  Weight: 130 lb (59 kg)  Height: 5\' 6"  (1.676 m)   Body mass index is 20.98 kg/m.  Gen: resting comfortably, no acute distress HEENT: no scleral icterus, pupils equal round and reactive, no palptable cervical adenopathy,  CV: irreg, tachy, no m/r/g no jvd Resp: Clear to auscultation bilaterally GI: abdomen is soft, non-tender, non-distended, normal bowel sounds, no hepatosplenomegaly MSK:  extremities are warm, no edema.  Skin: warm, no rash Neuro:  no focal deficits Psych: appropriate affect   Diagnostic Studies 10/2022 echo 1. Left ventricular ejection fraction, by estimation, is 55 to 60%. The  left ventricle has normal function. The left ventricle has no regional  wall motion abnormalities. There is mild concentric left ventricular  hypertrophy. Left ventricular diastolic  parameters are indeterminate. The average left ventricular global  longitudinal strain is -19.6 %. The global longitudinal strain is normal.   2. Right ventricular systolic function is low normal. The right  ventricular size is normal. There is normal pulmonary artery systolic  pressure. The estimated right ventricular systolic pressure is [id number] mmHg.   3. Left atrial size was severely dilated.   4. Right atrial size was moderately dilated.   5. The mitral valve is degenerative. Mild mitral valve regurgitation.  Moderate mitral annular calcification.   6. The aortic valve is tricuspid. There is mild calcification of the  aortic valve. Aortic valve regurgitation is mild. Aortic valve sclerosis  is present, with no evidence of aortic valve stenosis. Aortic  regurgitation PHT measures 560 msec.   7. The inferior vena cava is normal in size with greater than 50%  respiratory variability, suggesting right atrial pressure of 3 mmHg     Assessment and Plan  1.PAF - remains in afib today. Dynamap rates inaccurate, by EKG in the 120s. She is asymptomatic - add lopressor 12.5mg  bid to her ditlaizem, nursing visit in 1 week with vitals and EKG - reports muscle aches on eliquis, will try xarelto 20mg  daily samples, if tolerates send in prescription  2. Hyperlipidemia - continue statin, f/u pcp labs       Arnoldo Lenis, M.D.

## 2022-11-08 ENCOUNTER — Other Ambulatory Visit (HOSPITAL_COMMUNITY): Payer: Self-pay

## 2022-11-09 ENCOUNTER — Telehealth: Payer: Self-pay | Admitting: *Deleted

## 2022-11-09 NOTE — Telephone Encounter (Signed)
Left a message for the patient to call back.  

## 2022-11-09 NOTE — Telephone Encounter (Signed)
-----   Message from Wellington Hampshire, MD sent at 11/09/2022 11:02 AM EDT ----- Inform patient that echo was fine.  Normal ejection fraction with significant biatrial enlargement and mild mitral regurgitation.

## 2022-11-12 ENCOUNTER — Other Ambulatory Visit (HOSPITAL_COMMUNITY): Payer: Self-pay

## 2022-11-15 ENCOUNTER — Ambulatory Visit (INDEPENDENT_AMBULATORY_CARE_PROVIDER_SITE_OTHER): Payer: Medicare HMO | Admitting: Internal Medicine

## 2022-11-15 ENCOUNTER — Other Ambulatory Visit: Payer: Self-pay

## 2022-11-15 ENCOUNTER — Encounter: Payer: Self-pay | Admitting: Internal Medicine

## 2022-11-15 ENCOUNTER — Encounter: Payer: Self-pay | Admitting: Cardiology

## 2022-11-15 ENCOUNTER — Other Ambulatory Visit (HOSPITAL_COMMUNITY): Payer: Self-pay

## 2022-11-15 ENCOUNTER — Ambulatory Visit: Payer: Medicare HMO | Attending: Internal Medicine

## 2022-11-15 VITALS — BP 110/52 | HR 86 | Ht 66.0 in | Wt 133.2 lb

## 2022-11-15 VITALS — BP 94/62 | HR 68 | Ht 66.0 in | Wt 133.4 lb

## 2022-11-15 DIAGNOSIS — I480 Paroxysmal atrial fibrillation: Secondary | ICD-10-CM

## 2022-11-15 DIAGNOSIS — I4891 Unspecified atrial fibrillation: Secondary | ICD-10-CM | POA: Diagnosis not present

## 2022-11-15 DIAGNOSIS — M810 Age-related osteoporosis without current pathological fracture: Secondary | ICD-10-CM

## 2022-11-15 DIAGNOSIS — Z0000 Encounter for general adult medical examination without abnormal findings: Secondary | ICD-10-CM | POA: Diagnosis not present

## 2022-11-15 DIAGNOSIS — G4700 Insomnia, unspecified: Secondary | ICD-10-CM

## 2022-11-15 MED ORDER — CALCIUM CARB-CHOLECALCIFEROL 600-10 MG-MCG PO TABS
1.0000 | ORAL_TABLET | Freq: Every day | ORAL | 2 refills | Status: DC
  Filled 2022-11-15: qty 30, fill #0

## 2022-11-15 MED ORDER — TRAZODONE HCL 50 MG PO TABS
25.0000 mg | ORAL_TABLET | Freq: Every evening | ORAL | 3 refills | Status: DC | PRN
  Filled 2022-11-15: qty 30, 30d supply, fill #0

## 2022-11-15 MED ORDER — RIVAROXABAN 20 MG PO TABS
20.0000 mg | ORAL_TABLET | Freq: Every day | ORAL | 1 refills | Status: DC
  Filled 2022-11-15: qty 90, 90d supply, fill #0
  Filled 2023-02-09: qty 90, 90d supply, fill #1

## 2022-11-15 MED ORDER — ALENDRONATE SODIUM 70 MG PO TABS
70.0000 mg | ORAL_TABLET | ORAL | 11 refills | Status: DC
  Filled 2022-11-15: qty 4, 28d supply, fill #0
  Filled 2022-12-09: qty 4, 28d supply, fill #1

## 2022-11-15 NOTE — Assessment & Plan Note (Signed)
Recurring issue.  We have previously reviewed the importance of sleep hygiene measures.  More recently she has tried taking Xanax provided from a friend.  This has been effective in helping her to sleep.  She is interested in receiving her own prescription for Xanax today. -I have strongly recommended that Ms. Giaimo avoid taking benzodiazepines, particularly if not provided by physician.  She is in agreement with trying trazodone 50 mg nightly.  This has been prescribed today.

## 2022-11-15 NOTE — Assessment & Plan Note (Signed)
T-score -2.7 on DEXA from October 2023.  Vitamin D levels have been recently been replenished. -Start Fosamax 70 mg weekly -Recommend resuming vitamin D + calcium supplementation

## 2022-11-15 NOTE — Patient Instructions (Signed)
It was a pleasure to see you today.  Thank you for giving [place] the opportunity to be involved in your care.  Below is a brief recap of your visit and next steps.  We will plan to see you again in 3 months.  Summary Add fosamax for osteoporosis Try trazodone for sleep Start vitamin D and calcium supplementation Follow up in 3 months

## 2022-11-15 NOTE — Progress Notes (Signed)
Patient made aware, verbalized understanding. States that she is tolerating xarelto with no problems. Refill sent to [facility] as requested.

## 2022-11-15 NOTE — Progress Notes (Signed)
Patient came in the office as a nurse visit for vitals signs check and EKG. States that she was started on Xarelto (given samples) at her last visit with Dr. Harl Bowie and only has a few left. Wants to know if this is a medication that she is going to be on permanently and if so, can a prescription be sent to [facility].  EKG was reviewed by Dr. Dellia Cloud.

## 2022-11-15 NOTE — Progress Notes (Signed)
Remains in afib but heart rates are controlled. Is she tolerating the xarelto without side effects, if so please send in prescription  Zandra Abts MD

## 2022-11-15 NOTE — Telephone Encounter (Signed)
Patient made aware of results and verbalized understanding.  

## 2022-11-15 NOTE — Progress Notes (Signed)
Established Patient Office Visit  Subjective   Patient ID: Carrie Case, female    DOB: [birth year]  Age: 67 y.o. MRN: [id number]  Chief Complaint  Patient presents with   Osteoporosis    Follow up   Insomnia   Carrie Case returns to care today for follow-up.  She was last evaluated by me on 08/16/22 for follow-up of osteoporosis and memory concerns.  In the interim she has been evaluated by cardiology and started on Xarelto and rate control therapy for management of A-fib with RVR.  There have otherwise been no acute interval events.  Carrie Case reports feeling well today.  Her acute concern is insomnia.  She states that she has not been sleeping well.  A friend gave her a few Xanax tablets, which have been effective in helping her to sleep.  She is interested in receiving a prescription for Xanax today.  She is otherwise asymptomatic and has no additional concerns to discuss.  Past Medical History:  Diagnosis Date   Acute respiratory failure with hypoxia ([place]) 12/22/2018   Tobacco dependence    Past Surgical History:  Procedure Laterality Date   TUBAL LIGATION     Social History   Tobacco Use   Smoking status: Every Day    Packs/day: 0.50    Years: 47.00    Additional pack years: 0.00    Total pack years: 23.50    Types: Cigarettes    Passive exposure: Never   Smokeless tobacco: Never   Tobacco comments:    Smoking about 1/2 ppd per patient on 11/01/2021  Vaping Use   Vaping Use: Never used  Substance Use Topics   Alcohol use: No   Drug use: No   Family History  Problem Relation Age of Onset   Hypertension Mother    Hyperlipidemia Mother    Cancer Mother    COPD Mother    Anxiety disorder Mother    Allergies  Allergen Reactions   Ibuprofen Hives   Review of Systems  Psychiatric/Behavioral:  The patient has insomnia.   All other systems reviewed and are negative.    Objective:     BP 94/62   Pulse 68   Ht 5\' 6"  (1.676 m)   Wt 133 lb 6.4 oz (60.5  kg)   SpO2 (!) 89%   BMI 21.53 kg/m  BP Readings from Last 3 Encounters:  11/15/22 94/62  11/15/22 (!) 110/52  11/07/22 100/78   Physical Exam Vitals reviewed.  Constitutional:      General: She is not in acute distress.    Appearance: Normal appearance. She is not toxic-appearing.  HENT:     Head: Normocephalic and atraumatic.     Right Ear: External ear normal.     Left Ear: External ear normal.     Nose: Nose normal. No congestion or rhinorrhea.     Mouth/Throat:     Mouth: Mucous membranes are moist.     Pharynx: Oropharynx is clear. No oropharyngeal exudate or posterior oropharyngeal erythema.  Eyes:     General: No scleral icterus.    Extraocular Movements: Extraocular movements intact.     Conjunctiva/sclera: Conjunctivae normal.     Pupils: Pupils are equal, round, and reactive to light.  Cardiovascular:     Rate and Rhythm: Normal rate. Rhythm irregular.     Pulses: Normal pulses.     Heart sounds: Normal heart sounds. No murmur heard.    No friction rub. No gallop.  Pulmonary:  Effort: Pulmonary effort is normal.     Breath sounds: Normal breath sounds. No wheezing, rhonchi or rales.  Abdominal:     General: Abdomen is flat. Bowel sounds are normal. There is no distension.     Palpations: Abdomen is soft.     Tenderness: There is no abdominal tenderness.  Musculoskeletal:        General: No swelling. Normal range of motion.     Cervical back: Normal range of motion.     Right lower leg: No edema.     Left lower leg: No edema.  Lymphadenopathy:     Cervical: No cervical adenopathy.  Skin:    General: Skin is warm and dry.     Capillary Refill: Capillary refill takes less than 2 seconds.     Coloration: Skin is not jaundiced.  Neurological:     General: No focal deficit present.     Mental Status: She is alert and oriented to person, place, and time.  Psychiatric:        Mood and Affect: Mood normal.        Behavior: Behavior normal.   Last CBC Lab  Results  Component Value Date   WBC 8.6 02/14/2022   HGB 15.5 02/14/2022   HCT 45.1 02/14/2022   MCV 95 02/14/2022   MCH 32.6 02/14/2022   RDW 13.7 02/14/2022   PLT 307 [id number]   Last metabolic panel Lab Results  Component Value Date   GLUCOSE 77 06/18/2022   NA 138 06/18/2022   K 5.1 06/18/2022   CL 98 06/18/2022   CO2 24 06/18/2022   BUN 10 06/18/2022   CREATININE 0.83 06/18/2022   EGFR 78 06/18/2022   CALCIUM 9.7 06/18/2022   PROT 7.6 06/18/2022   ALBUMIN 4.0 06/18/2022   LABGLOB 3.6 06/18/2022   AGRATIO 1.1 (L) 06/18/2022   BILITOT 0.2 06/18/2022   ALKPHOS 76 06/18/2022   AST 17 06/18/2022   ALT 10 06/18/2022   ANIONGAP 10 12/24/2018   Last lipids Lab Results  Component Value Date   CHOL 145 02/14/2022   HDL 47 02/14/2022   LDLCALC 80 02/14/2022   TRIG 94 02/14/2022   CHOLHDL 3.1 02/14/2022   Last hemoglobin A1c Lab Results  Component Value Date   HGBA1C 5.6 05/21/2022   Last thyroid functions Lab Results  Component Value Date   TSH 3.290 02/14/2022   T4TOTAL 6.3 02/14/2022   Last vitamin D Lab Results  Component Value Date   VD25OH 60.3 08/16/2022     Assessment & Plan:   Problem List Items Addressed This Visit       Atrial fibrillation with RVR (HCC) (Chronic)    Followed by cardiology (Dr. Harl Bowie).  ECG obtained earlier today shows that she remains in A-fib.  Currently prescribed Cardizem, metoprolol, and Xarelto.  She is borderline hypotensive but asymptomatic.  She has not experienced any adverse side effects since switching from Eliquis to Xarelto. -No medication changes today.  Continue current regimen.      Osteoporosis    T-score -2.7 on DEXA from October 2023.  Vitamin D levels have been recently been replenished. -Start Fosamax 70 mg weekly -Recommend resuming vitamin D + calcium supplementation      Insomnia - Primary    Recurring issue.  We have previously reviewed the importance of sleep hygiene measures.  More recently  she has tried taking Xanax provided from a friend.  This has been effective in helping her to sleep.  She is interested  in receiving her own prescription for Xanax today. -I have strongly recommended that [name] avoid taking benzodiazepines, particularly if not provided by physician.  She is in agreement with trying trazodone 50 mg nightly.  This has been prescribed today.       Return in about 3 months (around 02/15/2023).    Johnette Abraham, MD

## 2022-11-15 NOTE — Progress Notes (Signed)
Subjective:    Carrie Case is a 67 y.o. female who presents for a Welcome to Medicare exam.   Cardiac Risk Factors include: advanced age (>34men, >52 women);sedentary lifestyle;smoking/ tobacco exposure      Objective:    There were no vitals filed for this visit.There is no height or weight on file to calculate BMI.  Medications Outpatient Encounter Medications as of 11/15/2022  Medication Sig   albuterol (PROVENTIL) (2.5 MG/3ML) 0.083% nebulizer solution TAKE 3 MLS (2.5 MG TOTAL) BY NEBULIZATION EVERY 6 (SIX) HOURS AS NEEDED FOR WHEEZING OR SHORTNESS OF BREATH.   albuterol (VENTOLIN HFA) 108 (90 Base) MCG/ACT inhaler INHALE 1-2 PUFFS INTO THE LUNGS EVERY 6 (SIX) HOURS AS NEEDED FOR WHEEZING OR SHORTNESS OF BREATH.   diltiazem (CARTIA XT) 300 MG 24 hr capsule Take 1 capsule (300 mg total) by mouth daily.   metoprolol tartrate (LOPRESSOR) 25 MG tablet Take 0.5 tablets (12.5 mg total) by mouth 2 (two) times daily.   rivaroxaban (XARELTO) 20 MG TABS tablet Take 1 tablet (20 mg total) by mouth daily with supper.   rosuvastatin (CRESTOR) 20 MG tablet Take 1 tablet (20 mg total) by mouth daily.   Tiotropium Bromide-Olodaterol (STIOLTO RESPIMAT) 2.5-2.5 MCG/ACT AERS Inhale 2 puffs by mouth every morning.   No facility-administered encounter medications on file as of 11/15/2022.     History: Past Medical History:  Diagnosis Date   Acute respiratory failure with hypoxia ([place]) 12/22/2018   Tobacco dependence    Past Surgical History:  Procedure Laterality Date   TUBAL LIGATION      Family History  Problem Relation Age of Onset   Hypertension Mother    Hyperlipidemia Mother    Cancer Mother    COPD Mother    Anxiety disorder Mother    Social History   Occupational History   Occupation: unemployed  Tobacco Use   Smoking status: Every Day    Packs/day: 0.50    Years: 47.00    Additional pack years: 0.00    Total pack years: 23.50    Types: Cigarettes    Passive  exposure: Never   Smokeless tobacco: Never   Tobacco comments:    Smoking about 1/2 ppd per patient on 11/01/2021  Vaping Use   Vaping Use: Never used  Substance and Sexual Activity   Alcohol use: No   Drug use: No   Sexual activity: Not Currently    Tobacco Counseling Ready to quit: Not Answered Counseling given: Not Answered Tobacco comments: Smoking about 1/2 ppd per patient on 11/01/2021   Immunizations and Health Maintenance Immunization History  Administered Date(s) Administered   Tdap 10/24/2006   Health Maintenance Due  Topic Date Due   Zoster Vaccines- Shingrix (1 of 2) Never done   DTaP/Tdap/Td (2 - Td or Tdap) 10/23/2016    Activities of Daily Living    11/15/2022   10:25 AM  In your present state of health, do you have any difficulty performing the following activities:  Hearing? 0  Vision? 0  Difficulty concentrating or making decisions? 0  Walking or climbing stairs? 1  Dressing or bathing? 0  Doing errands, shopping? 0  Preparing Food and eating ? N  Using the Toilet? N  In the past six months, have you accidently leaked urine? N  Do you have problems with loss of bowel control? N  Managing your Medications? N  Managing your Finances? N  Housekeeping or managing your Housekeeping? N    Advanced Directives:  Does Patient Have a Medical Advance Directive?: No Would patient like information on creating a medical advance directive?: No - Patient declined    Assessment:    This is a routine wellness examination for this patient .   Vision/Hearing screen No results found.  Dietary issues and exercise activities discussed:  Current Exercise Habits: The patient does not participate in regular exercise at present   Goals      Patient Stated     Get better and move around more      Depression Screen    11/15/2022   10:24 AM 11/15/2022   10:23 AM 11/15/2022   10:16 AM 08/16/2022   11:14 AM  PHQ 2/9 Scores  PHQ - 2 Score 6 6 6 6   PHQ- 9 Score  26 26 25 27      Fall Risk    11/15/2022   10:24 AM  Fall Risk   Falls in the past year? 1  Number falls in past yr: 0  Injury with Fall? 0  Risk for fall due to : No Fall Risks  Follow up Falls evaluation completed    Cognitive Function:        11/15/2022   10:25 AM  6CIT Screen  What Year? 0 points  What month? 0 points  What time? 0 points  Count back from 20 0 points  Months in reverse 0 points  Repeat phrase 0 points  Total Score 0 points    Patient Care Team: Johnette Abraham, MD as PCP - General (Internal Medicine) Jettie Booze, MD as PCP - Cardiology (Cardiology) Wellington Hampshire, MD as PCP - [facility] Cardiology (Cardiology)     Plan:     I have personally reviewed and noted the following in the patient's chart:   Medical and social history Use of alcohol, tobacco or illicit drugs  Current medications and supplements Functional ability and status Nutritional status Physical activity Advanced directives List of other physicians Hospitalizations, surgeries, and ER visits in previous 12 months Vitals Screenings to include cognitive, depression, and falls Referrals and appointments  In addition, I have reviewed and discussed with patient certain preventive protocols, quality metrics, and best practice recommendations. A written personalized care plan for preventive services as well as general preventive health recommendations were provided to patient.     Johnette Abraham, MD 11/15/2022

## 2022-11-15 NOTE — Patient Instructions (Signed)

## 2022-11-15 NOTE — Assessment & Plan Note (Signed)
Followed by cardiology (Dr. Harl Bowie).  ECG obtained earlier today shows that she remains in A-fib.  Currently prescribed Cardizem, metoprolol, and Xarelto.  She is borderline hypotensive but asymptomatic.  She has not experienced any adverse side effects since switching from Eliquis to Xarelto. -No medication changes today.  Continue current regimen.

## 2022-11-16 ENCOUNTER — Other Ambulatory Visit (HOSPITAL_COMMUNITY): Payer: Self-pay

## 2022-12-03 ENCOUNTER — Other Ambulatory Visit (HOSPITAL_COMMUNITY): Payer: Self-pay

## 2022-12-03 ENCOUNTER — Other Ambulatory Visit: Payer: Self-pay

## 2022-12-10 ENCOUNTER — Other Ambulatory Visit: Payer: Self-pay

## 2022-12-10 NOTE — Progress Notes (Unsigned)
Carrie Case, female    DOB: 16-Feb-1956  MRN: [id number]   Brief patient profile:  53  yowf  active smoker  referred to pulmonary clinic in [place]  11/01/2021 by Dr Dolphus Jenny for doe. Proved to have GOLD 2 COPD criteria 02/2022    History of Present Illness  11/01/2021  Pulmonary/ 1st Case eval/ Carrie Case / [place] [facility]  Chief Complaint  Patient presents with   Consult    Referred by Dr. [name] for Chronic bronchitis and COPD   Dyspnea:  MMRC2 = can't walk a nl pace on a flat grade s sob but does fine slow and flat no better on spiriva /some better on neb  Cough: none  Sleep: 3-4 x per week 30-45 degrees due to habit  SABA use: neb twice daily on avg - typically p exertion  Rec Plan A = Automatic = Always=    Spiriva one capsule each am  Plan B = Backup (to supplement plan A, not to replace it) Only use your albuterol inhaler  Plan C = Crisis (instead of Plan B but only if Plan B stops working) - only use your albuterol nebulizer if you first try Plan B    04/20/2022  f/u ov/[place] Case/Carrie Case re: GOLD 2 maint on spiriva   Chief Complaint  Patient presents with   Follow-up    Breathing is same since last ov   Dyspnea:  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, some better with spiriva dpi  Cough: none  Sleeping: still sleep recliner  SABA use: never neb/ rarely hfa  02: none  Covid status: never vax, never infected  Lung cancer screening: rec  Rec My Case will be contacting you by phone for referral to lung cancer screening program   Plan A = Automatic = Always=    spiriva respimat (or stiolto) 2 pffs 1st thing in am Work on inhaler technique: Plan B = Backup (to supplement plan A, not to replace it) Only use your albuterol inhaler as a rescue medication  Plan C = Crisis (instead of Plan B but only if Plan B stops working) - only use your albuterol nebulizer if you first try Plan B  The key is to stop  smoking completely before smoking completely stops you!   12/11/2022  f/u ov/Carrie Case/Carrie Case re: GOLD 2  maint on Stiolto/ still smoking   Chief Complaint  Patient presents with   Follow-up    Pt f/u states that she is doing "ok", no questions or concerns  Dyspnea:  worse stop while working in kitchen 50 times / tightness  Cough: none  Sleeping: sleeping in recliner same angle as usual  SABA use: rarely using  02: none     No obvious day to day or daytime variability or assoc excess/ purulent sputum or mucus plugs or hemoptysis or cp or chest tightness, subjective wheeze or overt sinus or hb symptoms.   *** without nocturnal  or early am exacerbation  of respiratory  c/o's or need for noct saba. Also denies any obvious fluctuation of symptoms with weather or environmental changes or other aggravating or alleviating factors except as outlined above   No unusual exposure hx or h/o childhood pna/ asthma or knowledge of premature birth.  Current Allergies, Complete Past Medical History, Past Surgical History, Family History, and Social History were reviewed in [employer] record.  ROS  The following are not  active complaints unless bolded Hoarseness, sore throat, dysphagia, dental problems, itching, sneezing,  nasal congestion or discharge of excess mucus or purulent secretions, ear ache,   fever, chills, sweats, unintended wt loss or wt gain, classically pleuritic or exertional cp,  orthopnea pnd or arm/hand swelling  or leg swelling, presyncope, palpitations, abdominal pain, anorexia, nausea, vomiting, diarrhea  or change in bowel habits or change in bladder habits, change in stools or change in urine, dysuria, hematuria,  rash, arthralgias, visual complaints, headache, numbness, weakness or ataxia or problems with walking or coordination,  change in mood or  memory.        Current Meds  Medication Sig   albuterol (VENTOLIN HFA) 108 (90 Base) MCG/ACT inhaler  INHALE 1-2 PUFFS INTO THE LUNGS EVERY 6 (SIX) HOURS AS NEEDED FOR WHEEZING OR SHORTNESS OF BREATH.   alendronate (FOSAMAX) 70 MG tablet Take 1 tablet (70 mg total) by mouth every 7 (seven) days. Take with a full glass of water on an empty stomach.   Calcium Carb-Cholecalciferol (CALCIUM 600/VITAMIN D) 600-10 MG-MCG TABS Take 1 tablet by mouth daily.   diltiazem (CARTIA XT) 300 MG 24 hr capsule Take 1 capsule (300 mg total) by mouth daily.   metoprolol tartrate (LOPRESSOR) 25 MG tablet Take 0.5 tablets (12.5 mg total) by mouth 2 (two) times daily.   rivaroxaban (XARELTO) 20 MG TABS tablet Take 1 tablet (20 mg total) by mouth daily with supper.   rosuvastatin (CRESTOR) 20 MG tablet Take 1 tablet (20 mg total) by mouth daily.   Tiotropium Bromide-Olodaterol (STIOLTO RESPIMAT) 2.5-2.5 MCG/ACT AERS Inhale 2 puffs by mouth every morning.   traZODone (DESYREL) 50 MG tablet Take 0.5-1 tablets (25-50 mg total) by mouth at bedtime as needed for sleep.                    Past Medical History:  Diagnosis Date   Acute respiratory failure with hypoxia ([facility]) 12/22/2018   Tobacco dependence        Objective:    Wts   12/11/2022       131   04/20/2022         131   02/14/22 132 lb 6.4 oz (60.1 kg)  11/01/21 137 lb (62.1 kg)  08/29/21 139 lb (63 kg)    Vital signs reviewed  12/11/2022  - Note at rest 02 sats  93% on RA   General appearance:    stoic amb wf nad     Mild barr ***        Assessment

## 2022-12-11 ENCOUNTER — Encounter: Payer: Self-pay | Admitting: Internal Medicine

## 2022-12-11 ENCOUNTER — Ambulatory Visit (INDEPENDENT_AMBULATORY_CARE_PROVIDER_SITE_OTHER): Payer: Medicare HMO | Admitting: Internal Medicine

## 2022-12-11 VITALS — BP 96/67 | HR 72 | Ht 66.0 in | Wt 131.0 lb

## 2022-12-11 DIAGNOSIS — J449 Chronic obstructive pulmonary disease, unspecified: Secondary | ICD-10-CM

## 2022-12-11 DIAGNOSIS — F17210 Nicotine dependence, cigarettes, uncomplicated: Secondary | ICD-10-CM | POA: Diagnosis not present

## 2022-12-11 DIAGNOSIS — R0789 Other chest pain: Secondary | ICD-10-CM

## 2022-12-11 NOTE — Patient Instructions (Addendum)
Stop alendronate x 6 week trial as it may have made your chest tightness   Pantoprazole (protonix) 40 mg   Take  30-60 min before first meal of the day and Pepcid (famotidine)  20 mg after supper until return to office - this is the best way to tell whether stomach acid is contributing to your problem.    Also  Ok to try albuterol 15 min before an activity (on alternating days)  that you know would usually make you short of breath and see if it makes any difference and if makes none then don't take albuterol after activity unless you can't catch your breath as this means it's the resting that helps, not the albuterol.  The key is to stop smoking completely before smoking completely stops you!  Please schedule a follow up office visit in 6 weeks, call sooner if needed with all medications /inhalers/ solutions in hand so we can verify exactly what you are taking. This includes all medications from all doctors and over the counters

## 2022-12-12 ENCOUNTER — Telehealth: Payer: Self-pay | Admitting: Internal Medicine

## 2022-12-12 ENCOUNTER — Encounter: Payer: Self-pay | Admitting: Internal Medicine

## 2022-12-12 DIAGNOSIS — R0789 Other chest pain: Secondary | ICD-10-CM | POA: Insufficient documentation

## 2022-12-12 NOTE — Assessment & Plan Note (Addendum)
4-5 min discussion re active cigarette smoking in addition to office E&M  Ask about tobacco use:   ongoing Advise quitting   I took an extended  opportunity with this patient to outline the consequences of continued cigarette use  in airway disorders based on all the data we have from the multiple national lung health studies (perfomed over decades at millions of dollars in cost)  indicating that smoking cessation, not choice of inhalers or physicians, is the most important aspect of her care.   Assess willingness:  Not committed at this point Assist in quit attempt:  Per PCP when ready Arrange follow up:   Follow up per Primary Care planned   Chart review : requested LDSCT 04/2022 but not done > requested again

## 2022-12-12 NOTE — Assessment & Plan Note (Signed)
Active smoking  - 11/01/2021  After extensive coaching inhaler device,  effectiveness =  75%> continue spiriva dpi and prn saba  - 11/01/2021   Walked on RA  x  3  lap(s) =  approx 450  ft  @ mod pace, stopped due to end of study, sob on 3rd lap with lowest 02 sats 94%  - PFT's  03/08/22  FEV1 1.62 (61 % ) ratio 0.69  p 5 % improvement from saba p  spiriva prior to study with DLCO  8.80 (41%)   and FV curve mild concavity   - 04/20/2022  After extensive coaching inhaler device,  effectiveness = 75%    SMI > try spiriva smi sample then rx with stiolto trial > f/u q 3 m   - 12/11/2022   Walked on RA  x  2  lap(s) =  approx 300  ft  @ slow pace, stopped due to sob  with lowest 02 sats 92% s cp /chest tightness    Pt is Group B in terms of symptom/risk and laba/lama therefore appropriate rx at this point >>>  continue stiolto and approp saba:  Re SABA :  I spent extra time with pt today reviewing appropriate use of albuterol for prn use on exertion with the following points: 1) saba is for relief of sob that does not improve by walking a slower pace or resting but rather if the pt does not improve after trying this first. 2) If the pt is convinced, as many are, that saba helps recover from activity faster then it's easy to tell if this is the case by re-challenging : ie stop, take the inhaler, then p 5 minutes try the exact same activity (intensity of workload) that just caused the symptoms and see if they are substantially diminished or not after saba 3) if there is an activity that reproducibly causes the symptoms, try the saba 15 min before the activity on alternate days   If in fact the saba really does help, then fine to continue to use it prn but advised may need to look closer at the maintenance regimen being used to achieve better control of airways disease with exertion.

## 2022-12-12 NOTE — Telephone Encounter (Signed)
ATC pt daughter LVM for her to call office back tomorrow.

## 2022-12-12 NOTE — Telephone Encounter (Signed)
My notes state she was being referred for LDSCT in se 2023 and she should call if not scheduled for her w/in a week.  We need to refer her again if she's willing but in meantime due to new worsening chest tightness needs plain cxr this week at [facility]

## 2022-12-12 NOTE — Assessment & Plan Note (Addendum)
Worse since rx fosamax suggesting possible GERD related cp  12/11/2022 > try off x 6 weeks and on max gerd rx/ f/u cards as well   Not reproducible in office today but doubt this is due to bronchospasm or hyperinflation due to copd > discussed plan with Dr [name] to try off in fosamax for now and consider other options (Reclast/prolia) if discomfort (which is chronic but much worse since fosamax) is GI related.  F/u in 6 weeks with all meds in hand using a trust but verify approach to confirm accurate Medication  Reconciliation The principal here is that until we are certain that the  patients are doing what we've asked, it makes no sense to ask them to do more.   Each maintenance medication was reviewed in detail including emphasizing most importantly the difference between maintenance and prns and under what circumstances the prns are to be triggered using an action plan format where appropriate.  Total time for H and P, chart review, counseling, reviewing smi device(s) , directly observing portions of ambulatory 02 saturation study/ and generating customized AVS unique to this office visit / same day charting = >  30 min for multiple  refractory chest symptoms of uncertain etiology

## 2023-01-01 ENCOUNTER — Other Ambulatory Visit: Payer: Self-pay

## 2023-01-07 ENCOUNTER — Other Ambulatory Visit: Payer: Self-pay

## 2023-01-17 ENCOUNTER — Other Ambulatory Visit: Payer: Self-pay | Admitting: Critical Care Medicine

## 2023-01-18 NOTE — Telephone Encounter (Signed)
Requested medications are due for refill today.  Unsure  Requested medications are on the active medications list.  yes  Last refill. 11/10/2021 #90 2 rf  Future visit scheduled.   no  Notes to clinic.  Refill written to expire 11/15/2022 - rx is expired. Christel Mormon is listed as PCP.    Requested Prescriptions  Pending Prescriptions Disp Refills   albuterol (PROVENTIL) (2.5 MG/3ML) 0.083% nebulizer solution 90 mL 2    Sig: TAKE 3 MLS (2.5 MG TOTAL) BY NEBULIZATION EVERY 6 (SIX) HOURS AS NEEDED FOR WHEEZING OR SHORTNESS OF BREATH.     Pulmonology:  Beta Agonists 2 Passed - 01/17/2023  3:54 PM      Passed - Last BP in normal range    BP Readings from Last 1 Encounters:  12/11/22 96/67         Passed - Last Heart Rate in normal range    Pulse Readings from Last 1 Encounters:  12/11/22 72         Passed - Valid encounter within last 12 months    Recent Outpatient Visits           11 months ago Atrial fibrillation with [facility])   [facility] [place], [place], [place]   1 year ago Achilles tendinitis of right lower extremity   [facility] [place], [place], [place]   2 years ago Left wristdrop   [name] Storm Frisk, MD   2 years ago Atrial fibrillation with [facility])   [facility] Storm Frisk, MD   2 years ago Recurrent falls   [facility] Cain Saupe, MD       Future Appointments             In 6 days Sherene Sires, Charlaine Dalton, MD [facility]   In 2 weeks Sharlene Dory, NP [facility] at [place], [place]   In 1 month [name], [name], MD [facility], [facility]

## 2023-01-19 ENCOUNTER — Other Ambulatory Visit (HOSPITAL_COMMUNITY): Payer: Self-pay

## 2023-01-23 ENCOUNTER — Other Ambulatory Visit (HOSPITAL_COMMUNITY): Payer: Self-pay

## 2023-01-23 ENCOUNTER — Telehealth: Payer: Self-pay | Admitting: *Deleted

## 2023-01-23 MED ORDER — ALBUTEROL SULFATE (2.5 MG/3ML) 0.083% IN NEBU
2.5000 mg | INHALATION_SOLUTION | Freq: Four times a day (QID) | RESPIRATORY_TRACT | 12 refills | Status: DC | PRN
  Filled 2023-01-23: qty 75, 7d supply, fill #0
  Filled 2023-06-14: qty 75, 7d supply, fill #1

## 2023-01-23 NOTE — Progress Notes (Deleted)
Carrie Case, female    DOB: March 21, 1956  MRN: [id number]   Brief patient profile:  86  yowf  active smoker  referred to pulmonary clinic in [place]  11/01/2021 by Dr Dolphus Jenny for doe. Proved to have GOLD 2 COPD criteria 02/2022    History of Present Illness  11/01/2021  Pulmonary/ 1st office eval/ Carrie Case / [place] [facility]  Chief Complaint  Patient presents with   Consult    Referred by Dr. [name] for Chronic bronchitis and COPD   Dyspnea:  MMRC2 = can't walk a nl pace on a flat grade s sob but does fine slow and flat no better on spiriva /some better on neb  Cough: none  Sleep: 3-4 x per week 30-45 degrees due to habit  SABA use: neb twice daily on avg - typically p exertion  Rec Plan A = Automatic = Always=    Spiriva one capsule each am  Plan B = Backup (to supplement plan A, not to replace it) Only use your albuterol inhaler  Plan C = Crisis (instead of Plan B but only if Plan B stops working) - only use your albuterol nebulizer if you first try Plan B    04/20/2022  f/u ov/[place] office/Carrie Case re: GOLD 2 maint on spiriva   Chief Complaint  Patient presents with   Follow-up    Breathing is same since last ov   Dyspnea:  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, some better with spiriva dpi  Cough: none  Sleeping: still sleep recliner  SABA use: never neb/ rarely hfa  02: none  Covid status: never vax, never infected  Lung cancer screening: rec  Rec My office will be contacting you by phone for referral to lung cancer screening program   Plan A = Automatic = Always=    spiriva respimat (or stiolto) 2 pffs 1st thing in am Work on inhaler technique: Plan B = Backup (to supplement plan A, not to replace it) Only use your albuterol inhaler as a rescue medication  Plan C = Crisis (instead of Plan B but only if Plan B stops working) - only use your albuterol nebulizer if you first try Plan B  The key is to stop  smoking completely before smoking completely stops you!   12/11/2022  f/u ov/[place] office/Carrie Case re: GOLD 2  maint on Stiolto/ still smoking   Chief Complaint  Patient presents with   Follow-up    Pt f/u states that she is doing "ok", no questions or concerns  Dyspnea:  worse and "has to stop while working in kitchen 50 times / tightness in chest" not reproducible in office walking on day of ov x 300 ft Cough: none  Sleeping: sleeping in recliner same angle as usual x years  SABA use: rarely using  02: none  Rec Stop alendronate x 6 week trial as it may have made your chest tightness  Pantoprazole (protonix) 40 mg   Take  30-60 min before first meal of the day and Pepcid (famotidine)  20 mg after supper until return to office  Also  Ok to try albuterol 15 min before an activity (on alternating days)  that you know would usually make you short of breath l. The key is to stop smoking completely before smoking completely stops you! Please schedule a follow up office visit in 6 weeks, call sooner if needed with all medications /inhalers/ solutions in hand  01/24/2023  f/u ov/[place] office/Carrie Case re: GOLD 2  maint on *** did *** bring meds No chief complaint on file.   Dyspnea:  *** Cough: *** Sleeping: *** SABA use: *** 02: *** Covid status: *** Lung cancer screening: ***   No obvious day to day or daytime variability or assoc excess/ purulent sputum or mucus plugs or hemoptysis or cp or chest tightness, subjective wheeze or overt sinus or hb symptoms.   *** without nocturnal  or early am exacerbation  of respiratory  c/o's or need for noct saba. Also denies any obvious fluctuation of symptoms with weather or environmental changes or other aggravating or alleviating factors except as outlined above   No unusual exposure hx or h/o childhood pna/ asthma or knowledge of premature birth.  Current Allergies, Complete Past Medical History, Past Surgical History, Family History, and  Social History were reviewed in [employer] record.  ROS  The following are not active complaints unless bolded Hoarseness, sore throat, dysphagia, dental problems, itching, sneezing,  nasal congestion or discharge of excess mucus or purulent secretions, ear ache,   fever, chills, sweats, unintended wt loss or wt gain, classically pleuritic or exertional cp,  orthopnea pnd or arm/hand swelling  or leg swelling, presyncope, palpitations, abdominal pain, anorexia, nausea, vomiting, diarrhea  or change in bowel habits or change in bladder habits, change in stools or change in urine, dysuria, hematuria,  rash, arthralgias, visual complaints, headache, numbness, weakness or ataxia or problems with walking or coordination,  change in mood or  memory.        No outpatient medications have been marked as taking for the 01/24/23 encounter (Appointment) with Nyoka Cowden, MD.                 Past Medical History:  Diagnosis Date   Acute respiratory failure with hypoxia ([facility]) 12/22/2018   Tobacco dependence        Objective:    Wts   01/24/2023          ***  12/11/2022       131   04/20/2022         131   02/14/22 132 lb 6.4 oz (60.1 kg)  11/01/21 137 lb (62.1 kg)  08/29/21 139 lb (63 kg)   Vital signs reviewed  01/24/2023  - Note at rest 02 sats  ***% on ***   General appearance:    ***       Mild barr ***        Assessment

## 2023-01-23 NOTE — Telephone Encounter (Signed)
Called and spoke w/ pt daughter Junious Dresser (ok per DPR) she requested a refill of pt neb medication (albuterol) - she states that they are unable to come to appt that was scheduled this week due to sickness.   Refill was sent in [facility] att.

## 2023-01-23 NOTE — Telephone Encounter (Signed)
Patient would like a new prescription of her nebulizer treatment. Patient states her current prescription is expired.   Please call and advise patient.

## 2023-01-24 ENCOUNTER — Other Ambulatory Visit: Payer: Self-pay

## 2023-01-24 ENCOUNTER — Ambulatory Visit: Payer: Medicare HMO | Admitting: Internal Medicine

## 2023-01-28 ENCOUNTER — Other Ambulatory Visit: Payer: Self-pay

## 2023-01-28 ENCOUNTER — Other Ambulatory Visit: Payer: Self-pay | Admitting: Internal Medicine

## 2023-01-28 DIAGNOSIS — I4891 Unspecified atrial fibrillation: Secondary | ICD-10-CM

## 2023-01-28 MED ORDER — DILTIAZEM HCL ER COATED BEADS 300 MG PO CP24
300.0000 mg | ORAL_CAPSULE | Freq: Every day | ORAL | 1 refills | Status: DC
  Filled 2023-01-28: qty 90, 90d supply, fill #0
  Filled 2023-04-30: qty 90, 90d supply, fill #1

## 2023-01-29 ENCOUNTER — Other Ambulatory Visit: Payer: Self-pay | Admitting: Internal Medicine

## 2023-01-29 ENCOUNTER — Other Ambulatory Visit (HOSPITAL_COMMUNITY): Payer: Self-pay

## 2023-01-29 DIAGNOSIS — I6523 Occlusion and stenosis of bilateral carotid arteries: Secondary | ICD-10-CM

## 2023-01-29 MED ORDER — ROSUVASTATIN CALCIUM 20 MG PO TABS
20.0000 mg | ORAL_TABLET | Freq: Every day | ORAL | 1 refills | Status: DC
  Filled 2023-01-29: qty 90, 90d supply, fill #0
  Filled 2023-05-08: qty 90, 90d supply, fill #1

## 2023-02-01 ENCOUNTER — Other Ambulatory Visit: Payer: Self-pay

## 2023-02-05 ENCOUNTER — Other Ambulatory Visit: Payer: Self-pay

## 2023-02-07 ENCOUNTER — Encounter: Payer: Self-pay | Admitting: Nurse Practitioner

## 2023-02-07 ENCOUNTER — Other Ambulatory Visit: Payer: Self-pay

## 2023-02-07 ENCOUNTER — Ambulatory Visit: Payer: Medicare HMO | Attending: Nurse Practitioner | Admitting: Nurse Practitioner

## 2023-02-07 ENCOUNTER — Emergency Department (HOSPITAL_COMMUNITY)
Admission: EM | Admit: 2023-02-07 | Discharge: 2023-02-07 | Disposition: A | Discharge status: Home / Self Care | Payer: Medicare HMO | Attending: Emergency Medicine | Admitting: Emergency Medicine

## 2023-02-07 ENCOUNTER — Emergency Department (HOSPITAL_COMMUNITY): Payer: Medicare HMO

## 2023-02-07 ENCOUNTER — Encounter (HOSPITAL_COMMUNITY): Payer: Self-pay

## 2023-02-07 ENCOUNTER — Encounter: Payer: Self-pay | Admitting: *Deleted

## 2023-02-07 VITALS — BP 102/72 | HR 126 | Ht 66.0 in | Wt 129.6 lb

## 2023-02-07 DIAGNOSIS — J449 Chronic obstructive pulmonary disease, unspecified: Secondary | ICD-10-CM | POA: Diagnosis not present

## 2023-02-07 DIAGNOSIS — Z7901 Long term (current) use of anticoagulants: Secondary | ICD-10-CM | POA: Diagnosis not present

## 2023-02-07 DIAGNOSIS — R0989 Other specified symptoms and signs involving the circulatory and respiratory systems: Secondary | ICD-10-CM

## 2023-02-07 DIAGNOSIS — R0902 Hypoxemia: Secondary | ICD-10-CM | POA: Diagnosis not present

## 2023-02-07 DIAGNOSIS — J9811 Atelectasis: Secondary | ICD-10-CM | POA: Diagnosis not present

## 2023-02-07 DIAGNOSIS — I4891 Unspecified atrial fibrillation: Secondary | ICD-10-CM

## 2023-02-07 DIAGNOSIS — J439 Emphysema, unspecified: Secondary | ICD-10-CM | POA: Diagnosis not present

## 2023-02-07 DIAGNOSIS — R0602 Shortness of breath: Secondary | ICD-10-CM | POA: Diagnosis not present

## 2023-02-07 LAB — BASIC METABOLIC PANEL
Anion gap: 8 (ref 5–15)
BUN: 8 mg/dL (ref 8–23)
CO2: 30 mmol/L (ref 22–32)
Calcium: 9.8 mg/dL (ref 8.9–10.3)
Chloride: 100 mmol/L (ref 98–111)
Creatinine, Ser: 1.02 mg/dL — ABNORMAL HIGH (ref 0.44–1.00)
GFR, Estimated: >60 mL/min (ref >60)
Glucose, Bld: 94 mg/dL (ref 70–99)
Potassium: 4.4 mmol/L (ref 3.5–5.1)
Sodium: 138 mmol/L (ref 135–145)

## 2023-02-07 LAB — CBC
HCT: 50.9 % — ABNORMAL HIGH (ref 36.0–46.0)
Hemoglobin: 16.9 g/dL — ABNORMAL HIGH (ref 12.0–15.0)
MCH: 32.3 pg (ref 26.0–34.0)
MCHC: 33.2 g/dL (ref 30.0–36.0)
MCV: 97.1 fL (ref 80.0–100.0)
Platelets: 253 10*3/uL (ref 150–400)
RBC: 5.24 MIL/uL — ABNORMAL HIGH (ref 3.87–5.11)
RDW: 15 % (ref 11.5–15.5)
WBC: 9.1 10*3/uL (ref 4.0–10.5)
nRBC: 0 % (ref 0.0–0.2)

## 2023-02-07 MED ORDER — LACTATED RINGERS IV BOLUS
500.0000 mL | Freq: Once | INTRAVENOUS | Status: AC
  Administered 2023-02-07: 500 mL via INTRAVENOUS

## 2023-02-07 MED ORDER — DILTIAZEM LOAD VIA INFUSION
10.0000 mg | Freq: Once | INTRAVENOUS | Status: AC
  Administered 2023-02-07: 10 mg via INTRAVENOUS
  Filled 2023-02-07: qty 10

## 2023-02-07 MED ORDER — DILTIAZEM HCL-DEXTROSE 125-5 MG/125ML-% IV SOLN (PREMIX)
5.0000 mg/h | INTRAVENOUS | Status: DC
  Administered 2023-02-07: 5 mg/h via INTRAVENOUS
  Filled 2023-02-07: qty 125

## 2023-02-07 NOTE — Progress Notes (Signed)
Office Visit    Patient Name: Carrie Case Date of Encounter: 02/07/2023  PCP:  Billie Lade, MD   [facility]  Cardiologist:  Dina Rich, MD  Advanced Practice Provider:  No care team member to display Electrophysiologist:  None    Chief Complaint    Carrie Case is a 67 y.o. female with a hx of PAF, PAD, hyperlipidemia, carotid artery stenosis, and history of COPD, who presents today for 68-month follow-up.  Past Medical History    Past Medical History:  Diagnosis Date   Acute respiratory failure with hypoxia (HCC) 12/22/2018   Tobacco dependence    Past Surgical History:  Procedure Laterality Date   TUBAL LIGATION      Allergies  Allergies  Allergen Reactions   Ibuprofen Hives    History of Present Illness    Carrie Case is a 67 y.o. female with a PMH as mentioned above.  Prior history of A-fib in setting of lung infection was not initially started on [facility].  Was found to be back in A-fib in February 2024, Eliquis was started.  After starting Eliquis, she noted leg pains, reduced medication to once a day and symptoms improved.  Followed by Dr. Kary Kos for history of PAD that has been managed medically, asymptomatic regarding history of left subclavian stenosis.   Last seen by Dr. Dina Rich on November 07, 2022.  She was found in A-fib with RVR on EKG with heart rate in the 120s, was asymptomatic.  Lopressor 12.5 mg twice daily added to diltiazem.  Eliquis was switched to Xarelto 20 mg daily.  Today she presents for 51-month follow-up.  She denies any acute cardiac complaints or issues.  She has only been taking Lopressor 12.5 mg once daily and senna twice daily, overall compliant with her medications, states that she sometimes might skip Xarelto here or there.  Denies any palpitations or fast heart rates. Denies any chest pain, shortness of breath, palpitations, syncope, presyncope, dizziness, orthopnea, PND, swelling or  significant weight changes, acute bleeding, or claudication. EKGs/Labs/Other Studies Reviewed:   The following studies were reviewed today:   EKG:  EKG is ordered today.  The ekg ordered today demonstrates A-fib with RVR, 126 bpm.  Echocardiogram 10/2022: 1. Left ventricular ejection fraction, by estimation, is 55 to 60%. The  left ventricle has normal function. The left ventricle has no regional  wall motion abnormalities. There is mild concentric left ventricular  hypertrophy. Left ventricular diastolic  parameters are indeterminate. The average left ventricular global  longitudinal strain is -19.6 %. The global longitudinal strain is normal.   2. Right ventricular systolic function is low normal. The right  ventricular size is normal. There is normal pulmonary artery systolic  pressure. The estimated right ventricular systolic pressure is 35.7 mmHg.   3. Left atrial size was severely dilated.   4. Right atrial size was moderately dilated.   5. The mitral valve is degenerative. Mild mitral valve regurgitation.  Moderate mitral annular calcification.   6. The aortic valve is tricuspid. There is mild calcification of the  aortic valve. Aortic valve regurgitation is mild. Aortic valve sclerosis  is present, with no evidence of aortic valve stenosis. Aortic  regurgitation PHT measures 560 msec.   7. The inferior vena cava is normal in size with greater than 50%  respiratory variability, suggesting right atrial pressure of 3 mmHg.   Comparison(s): Prior images reviewed side by side. LVEF remains normal  range  at 55-60%.    Recent Labs: 02/14/2022: Hemoglobin 15.5; Platelets 307; TSH 3.290 06/18/2022: ALT 10; BUN 10; Creatinine, Ser 0.83; Potassium 5.1; Sodium 138  Recent Lipid Panel    Component Value Date/Time   CHOL 145 02/14/2022 1645   TRIG 94 02/14/2022 1645   HDL 47 02/14/2022 1645   CHOLHDL 3.1 02/14/2022 1645   CHOLHDL 4.8 12/23/2018 0633   VLDL 38 12/23/2018 0633    LDLCALC 80 02/14/2022 1645    Risk Assessment/Calculations:   CHA2DS2-VASc Score = 3  This indicates a 3.2% annual risk of stroke. The patient's score is based upon: CHF History: 0 HTN History: 0 Diabetes History: 0 Stroke History: 0 Vascular Disease History: 1 Age Score: 1 Gender Score: 1     Home Medications   Current Meds  Medication Sig   albuterol (PROVENTIL) (2.5 MG/3ML) 0.083% nebulizer solution Take 3 mLs (2.5 mg total) by nebulization every 6 (six) hours as needed for wheezing or shortness of breath.   Calcium Carb-Cholecalciferol (CALCIUM 600/VITAMIN D) 600-10 MG-MCG TABS Take 1 tablet by mouth daily.   diltiazem (CARTIA XT) 300 MG 24 hr capsule Take 1 capsule (300 mg total) by mouth daily.   metoprolol tartrate (LOPRESSOR) 25 MG tablet Take 0.5 tablets (12.5 mg total) by mouth 2 (two) times daily. (Patient taking differently: Take 25 mg by mouth daily.)   rivaroxaban (XARELTO) 20 MG TABS tablet Take 1 tablet (20 mg total) by mouth daily with supper.   rosuvastatin (CRESTOR) 20 MG tablet Take 1 tablet (20 mg total) by mouth daily.   Tiotropium Bromide-Olodaterol (STIOLTO RESPIMAT) 2.5-2.5 MCG/ACT AERS Inhale 2 puffs by mouth every morning.     Review of Systems   All other systems reviewed and are otherwise negative except as noted above.  Physical Exam    VS:  BP 102/72   Pulse (!) 126   Ht 5\' 6"  (1.676 m)   Wt 129 lb 9.6 oz (58.8 kg)   SpO2 (!) 88%   BMI 20.92 kg/m  , BMI Body mass index is 20.92 kg/m.  Wt Readings from Last 3 Encounters:  02/07/23 129 lb 9.6 oz (58.8 kg)  12/11/22 131 lb (59.4 kg)  11/15/22 133 lb 6.4 oz (60.5 kg)     GEN: Well nourished, well developed, in no acute distress. HEENT: normal. Neck: Supple, no JVD, carotid bruits, or masses. Cardiac: S1/S2, fast rate and regular rhythm, no murmurs, rubs, or gallops. No clubbing, cyanosis, edema.  Radials/PT 2+ and equal bilaterally.  Respiratory:  Respirations regular and unlabored,  clear with diminished crackles noted to left posterior lobe, overall clear and diminished to auscultation bilaterally. GI: Soft, nontender, nondistended. MS: No deformity or atrophy. Skin: Warm and dry, no rash. Neuro:  Strength and sensation are intact. Psych: Normal affect.  Assessment & Plan    A-fib with RVR Chronic anticoagulation Hypoxia, COPD Lung crackles  Patient is a 67 year old female with past medical history as mentioned above.  She is a patient of Dr. Dina Rich.  At last office visit, she was found to be in A-fib with RVR, was started on Lopressor 12.5 mg twice daily and Eliquis was switched to Xarelto.  Today she presents for 31-month follow-up.  In she remains in A-fib with RVR, heart rate 126 bpm on EKG.  She is asymptomatic.  Denies any acute cardiac complaints or issues.  Consulted Dr. Jenene Slicker (DOD) in office who suggested ED evaluation, which I am in agreement with.  Also notified Dr. Diona Browner that  I am sending patient to the hospital.  Do have concern that last time she was in A-fib with RVR she had a lung infection, I am concerned that she may have possible pneumonia or COPD exacerbation going on at this time.  Had a frank discussion with her and strongly advised/recommended ED evaluation.  Patient discussed some concerns about not wanting to go to the hospital.  Discussed risk versus benefits of ED evaluation and how benefits outweigh risk. She verbalized understanding.  Called and gave report to Dr. Janene Harvey (ED physician at [facility]). Verbalized understanding of report.   Upon arrival to the ED, I recommend the following be done/obtained: 1.  Twelve-lead EKG, place patient on telemetry 2.  Labs including the following: CBC, CMET, magnesium, trops, proBNP 3.  2 view chest x-ray and update limited echocardiogram to evaluate EF, consult cardiology 4.  Adjust medications for rate control, consider DCCV in future if needed 5.  Admit for closer observation 6.   Discharge when in stable condition, follow-up with outpatient cardiology in 1 to 2 weeks after discharge.     Disposition: Follow up in 1-2 week(s) with Dina Rich, MD or APP.  Signed, Sharlene Dory, NP 02/07/2023, 10:17 AM [facility]

## 2023-02-07 NOTE — ED Provider Notes (Signed)
EMERGENCY DEPARTMENT AT [facility] Provider Note   CSN: [id number] Arrival date & time: 02/07/23  1046     History  Chief Complaint  Patient presents with   Atrial Fibrillation    SHAASIA MEIDL is a 67 y.o. female.   Atrial Fibrillation Pertinent negatives include no chest pain, no abdominal pain, no headaches and no shortness of breath.       JULIO KAATZ is a 67 y.o. female with hx of atrial fibrillation who presents to the Emergency Department from her cardiologist office.  Recommended to come here because heart rate was in 120's and told her oxygen was low.  Patient denies any symptoms, states she feels just fine and wants to go home.  States that she was in a hurry this morning and did not take her medications.    Home Medications Prior to Admission medications   Medication Sig Start Date End Date Taking? Authorizing Provider  albuterol (PROVENTIL) (2.5 MG/3ML) 0.083% nebulizer solution Take 3 mLs (2.5 mg total) by nebulization every 6 (six) hours as needed for wheezing or shortness of breath. 01/23/23   Nyoka Cowden, MD  alendronate (FOSAMAX) 70 MG tablet Take 1 tablet (70 mg total) by mouth every 7 (seven) days. Take with a full glass of water on an empty stomach. Patient not taking: Reported on 02/07/2023 11/15/22   Billie Lade, MD  Calcium Carb-Cholecalciferol (CALCIUM 600/VITAMIN D) 600-10 MG-MCG TABS Take 1 tablet by mouth daily. 11/15/22 02/13/23  Billie Lade, MD  diltiazem (CARTIA XT) 300 MG 24 hr capsule Take 1 capsule (300 mg total) by mouth daily. 01/28/23   Billie Lade, MD  metoprolol tartrate (LOPRESSOR) 25 MG tablet Take 0.5 tablets (12.5 mg total) by mouth 2 (two) times daily. Patient taking differently: Take 25 mg by mouth daily. 11/07/22   Antoine Poche, MD  rivaroxaban (XARELTO) 20 MG TABS tablet Take 1 tablet (20 mg total) by mouth daily with supper. 11/15/22   Antoine Poche, MD  rosuvastatin (CRESTOR) 20 MG  tablet Take 1 tablet (20 mg total) by mouth daily. 01/29/23   Billie Lade, MD  Tiotropium Bromide-Olodaterol (STIOLTO RESPIMAT) 2.5-2.5 MCG/ACT AERS Inhale 2 puffs by mouth every morning. 04/20/22   Nyoka Cowden, MD      Allergies    Ibuprofen    Review of Systems   Review of Systems  Constitutional:  Negative for chills and fever.  Eyes:  Negative for photophobia.  Respiratory:  Negative for shortness of breath.   Cardiovascular:  Negative for chest pain, palpitations and leg swelling.  Gastrointestinal:  Negative for abdominal pain, diarrhea, nausea and vomiting.  Musculoskeletal:  Negative for arthralgias and neck pain.  Neurological:  Negative for dizziness, syncope, weakness, numbness and headaches.  Psychiatric/Behavioral:  Negative for confusion.     Physical Exam Updated Vital Signs BP (!) 110/92   Temp 97.6 F (36.4 C) (Oral)   Resp 17   Ht 5\' 6"  (1.676 m)   Wt 58.5 kg   SpO2 95%   BMI 20.82 kg/m  Physical Exam Vitals and nursing note reviewed.  Constitutional:      General: She is not in acute distress.    Appearance: Normal appearance. She is not ill-appearing or toxic-appearing.  Eyes:     Extraocular Movements: Extraocular movements intact.     Pupils: Pupils are equal, round, and reactive to light.  Cardiovascular:     Rate and Rhythm: Tachycardia present.  Rhythm irregular.     Pulses: Normal pulses.  Pulmonary:     Effort: Pulmonary effort is normal. No respiratory distress.     Breath sounds: Normal breath sounds. No rhonchi.  Abdominal:     Palpations: Abdomen is soft.     Tenderness: There is no abdominal tenderness.  Musculoskeletal:        General: Normal range of motion.     Cervical back: No rigidity.     Right lower leg: No edema.     Left lower leg: No edema.  Skin:    General: Skin is warm.     Capillary Refill: Capillary refill takes less than 2 seconds.  Neurological:     General: No focal deficit present.     Mental Status: She  is alert.     Coordination: Coordination normal.     ED Results / Procedures / Treatments   Labs (all labs ordered are listed, but only abnormal results are displayed) Labs Reviewed  CBC - Abnormal; Notable for the following components:      Result Value   RBC 5.24 (*)    Hemoglobin 16.9 (*)    HCT 50.9 (*)    All other components within normal limits  BASIC METABOLIC PANEL - Abnormal; Notable for the following components:   Creatinine, Ser 1.02 (*)    All other components within normal limits    EKG EKG Interpretation  Date/Time:  Thursday February 07 2023 11:36:24 EDT Ventricular Rate:  142 PR Interval:    QRS Duration: 87 QT Interval:  313 QTC Calculation: 482 R Axis:   89 Text Interpretation: Atrial fibrillation with rapid ventricular response Borderline right axis deviation Non-specific ST-t changes Confirmed by Cathren Laine ([id number]) on 02/07/2023 11:46:56 AM  Radiology DG Chest Port 1 View  Result Date: 02/07/2023 CLINICAL DATA:  Shortness of breath EXAM: PORTABLE CHEST 1 VIEW COMPARISON:  Chest x-ray dated Dec 23, 2018 FINDINGS: Patient is rotated to the left. Cardiac and mediastinal contours are within normal limits. Possible area of focal pleural thickening involving the right hemithorax, could also be artifactual and related to overlying right arm. Bibasilar atelectasis. Mild emphysema. Lungs are otherwise clear. No evidence of pleural effusion or pneumothorax. IMPRESSION: 1. Apparent area of focal pleural thickening involving the right hemithorax, likely artifactual and related to overlying right arm. Recommend repeat chest x-ray after moving right arm. 2. Bibasilar atelectasis. Electronically Signed   By: Allegra Lai M.D.   On: 02/07/2023 12:06    Procedures Procedures     CRITICAL CARE Performed by: Mellissa Conley Total critical care time: 30 minutes Critical care time was exclusive of separately billable procedures and treating other patients. Critical care  was necessary to treat or prevent imminent or life-threatening deterioration. Critical care was time spent personally by me on the following activities: development of treatment plan with patient and/or surrogate as well as nursing, discussions with consultants, evaluation of patient's response to treatment, examination of patient, obtaining history from patient or surrogate, ordering and performing treatments and interventions, ordering and review of laboratory studies, ordering and review of radiographic studies, pulse oximetry and re-evaluation of patient's condition.  Medications Ordered in ED Medications  diltiazem (CARDIZEM) 1 mg/mL load via infusion 10 mg (has no administration in time range)    And  diltiazem (CARDIZEM) 125 mg in dextrose 5% 125 mL (1 mg/mL) infusion (has no administration in time range)  lactated ringers bolus 500 mL (has no administration in time range)  ED Course/ Medical Decision Making/ A&P    Cha2ds2-vasc score 2                           Medical Decision Making Pt with known hx of a fib, here under recommendation of cardiologist for rapid heart rate and hypoxia in office.  Here, pt denies any symptoms and reports that she feels fine and just wants to go home.  States that she didn't take her medications and that's the reason her heart rate is elevated.    I suspect this is an exacerbation of her a fib due to not taking her routine medications.  ACS also considered, acute infection  Amount and/or Complexity of Data Reviewed Labs: ordered.    Details: Labs unremarkable Radiology: ordered.    Details: Chest x-ray shows pleural thickening following right hemothorax felt to be artifactual and related to overlying right arm recommended repeat chest x-ray after moving the arm ECG/medicine tests: ordered.    Details: A fib with RVR Discussion of management or test interpretation with external provider(s): Pt given Cardizem here, observed in the dept.  No  hypoxia.  Heart rate improved  On recheck, patient resting comfortably she is requesting discharge as she states she feels fine without symptoms.  States her only reason her heart rate was high was because she did not take her diltiazem and her metoprolol this morning and states the only reason she is here is she was told to do so by cardiology.  Since patient is requesting discharge at this time, will recommend that she have close outpatient follow-up with cardiology for recheck.  Strict return precautions were also discussed.  Risk Prescription drug management.           Final Clinical Impression(s) / ED Diagnoses Final diagnoses:  Atrial fibrillation with RVR [facility])    Rx / DC Orders ED Discharge Orders     None         Rosey Bath 02/09/23 2109    Cathren Laine, MD 02/13/23 1407

## 2023-02-07 NOTE — Patient Instructions (Addendum)
Medication Instructions:  Your physician recommends that you continue on your current medications as directed. Please refer to the Current Medication list given to you today.  Labwork: none  Testing/Procedures: none  Follow-Up: Your physician recommends that you schedule a follow-up appointment in: 1-2 weeks post discharge  Any Other Special Instructions Will Be Listed Below (If Applicable). Sent to [facility] ED  If you need a refill on your cardiac medications before your next appointment, please call your pharmacy.

## 2023-02-07 NOTE — ED Triage Notes (Signed)
Pt saw her cardiologist this morning and they told her to come to ER because her heart rate was AFIB in the 120's with a hx of same.  Pt reports she feels great but she overslept this morning and did not get her meds and she thinks that is all she needs.

## 2023-02-07 NOTE — Discharge Instructions (Signed)
Is very important that you take your medications daily as directed.  Please keep your cardiology appointment as scheduled.  Return to the emergency department for any new or worsening symptoms.

## 2023-02-08 ENCOUNTER — Telehealth: Payer: Self-pay | Admitting: *Deleted

## 2023-02-08 NOTE — Telephone Encounter (Signed)
Patient would like to see if the recent xray obtained on 02/07/23 would be the same type of xray Dr. Sherene Sires ordered.   Please call and advise patient. [phone]

## 2023-02-08 NOTE — Telephone Encounter (Signed)
Yes that's fine - no additional xrays needed for now

## 2023-02-09 ENCOUNTER — Other Ambulatory Visit (HOSPITAL_COMMUNITY): Payer: Self-pay

## 2023-02-14 ENCOUNTER — Telehealth: Payer: Self-pay

## 2023-02-14 ENCOUNTER — Telehealth: Payer: Self-pay | Admitting: *Deleted

## 2023-02-14 NOTE — Telephone Encounter (Signed)
Transition Care Management Unsuccessful Follow-up Telephone Call  Date of discharge and from where:  02/07/2023 [facility]  Attempts:  1st Attempt  Reason for unsuccessful TCM follow-up call:  No answer/busy  [facility]  [facility] Population Health Community Resource Care Guide   [email or web address]  ?? [phone]   Website: [email or web address]  [email or web address]

## 2023-02-14 NOTE — Progress Notes (Signed)
  Care Coordination  Outreach Note  02/14/2023 Name: Carrie Case MRN: [id number] DOB: 03-05-1956   Care Coordination Outreach Attempts: An unsuccessful telephone outreach was attempted today to offer the patient information about available care coordination services.  Follow Up Plan:  Additional outreach attempts will be made to offer the patient care coordination information and services.   Encounter Outcome:  No Answer  Christie Nottingham  Care Coordination Care Guide  Direct Dial: [phone]

## 2023-02-14 NOTE — Progress Notes (Signed)
  Care Coordination   Note   02/14/2023 Name: ARLANDA SHIPLETT MRN: [id number] DOB: 01-Sep-1955  CAITLINN KLINKER is a 67 y.o. year old female who sees [name], [name], MD for primary care. I reached out to [name] by phone today to offer care coordination services.  Ms. Bradburn was given information about Care Coordination services today including:   The Care Coordination services include support from the care team which includes your Nurse Coordinator, Clinical Social Worker, or Pharmacist.  The Care Coordination team is here to help remove barriers to the health concerns and goals most important to you. Care Coordination services are voluntary, and the patient may decline or stop services at any time by request to their care team member.   Care Coordination Consent Status: Patient agreed to services and verbal consent obtained.   Follow up plan:  Telephone appointment with care coordination team member scheduled for:  02/19/23  Encounter Outcome:  Pt. Scheduled  [facility] Coordination Care Guide  Direct Dial: [phone]

## 2023-02-15 ENCOUNTER — Telehealth: Payer: Self-pay

## 2023-02-15 NOTE — Telephone Encounter (Signed)
Transition Care Management Follow-up Telephone Call Date of discharge and from where: 02/07/2023 [facility] How have you been since you were released from the hospital? Patient is feeling better Any questions or concerns? No  Items Reviewed: Did the pt receive and understand the discharge instructions provided? Yes  Medications obtained and verified? Yes  Other? No  Any new allergies since your discharge? No  Dietary orders reviewed? Yes Do you have support at home? Yes   Follow up appointments reviewed:  PCP Hospital f/u appt confirmed? Yes  Scheduled to see Christel Mormon, MD on 02/18/2023 @ [facility]. Specialist Hospital f/u appt confirmed? Yes  Scheduled to see Sharlene Dory, NP on 02/25/2023 @ [facility] [place]. Are transportation arrangements needed? No  If their condition worsens, is the pt aware to call PCP or go to the Emergency Dept.? Yes Was the patient provided with contact information for the PCP's office or ED? Yes Was to pt encouraged to call back with questions or concerns? Yes  Cord Wilczynski [facility]  [facility] Population Health Community Resource Care Guide   [email or web address]  ?? [phone]   Website: [email or web address]  [email or web address]

## 2023-02-18 ENCOUNTER — Encounter: Payer: Self-pay | Admitting: Internal Medicine

## 2023-02-18 ENCOUNTER — Ambulatory Visit (INDEPENDENT_AMBULATORY_CARE_PROVIDER_SITE_OTHER): Payer: Medicare HMO | Admitting: Internal Medicine

## 2023-02-18 VITALS — BP 119/76 | HR 81 | Ht 66.0 in | Wt 130.0 lb

## 2023-02-18 DIAGNOSIS — I4891 Unspecified atrial fibrillation: Secondary | ICD-10-CM | POA: Diagnosis not present

## 2023-02-18 NOTE — Patient Instructions (Signed)
It was a pleasure to see you today.  Thank you for giving [place] the opportunity to be involved in your care.  Below is a brief recap of your visit and next steps.  We will plan to see you again in 4 weeks.  Summary No medication changes today Cardiology follow up 7/8 We will follow up in 4 weeks

## 2023-02-18 NOTE — Assessment & Plan Note (Signed)
Presenting today for follow-up in the setting of recent ER presentation for A-fib with RVR on 6/20.  It was recommended that she present to the ED for possible DCCV.  Treated with IV diltiazem in the emergency department.  She remained asymptomatic and was ultimately discharged home.  Carrie Case remains asymptomatic today.  Rhythm is irregular, but rate is controlled.  She has been taking diltiazem 300 mg daily as prescribed, but reports only taking metoprolol tartrate 12.5 mg once daily instead of twice as prescribed.  She also reports that she often takes a full 25 mg tablet because the pills are difficult to split.  She has been taking Xarelto as prescribed. -No medication changes today -I have recommended that she take her medications as prescribed (diltiazem 300 mg daily, metoprolol tartrate 12.5 mg twice daily, and Xarelto 20 mg daily).  Consider switching to metoprolol succinate as she has difficulty splitting pills and taking metoprolol tartrate twice daily.  Cardiology follow-up is scheduled for 7/8.  She will return to care for routine follow-up with me in 4 weeks.

## 2023-02-18 NOTE — Progress Notes (Signed)
Established Patient Office Visit  Subjective   Patient ID: Carrie Case, female    DOB: 02-14-1956  Age: 67 y.o. MRN: [id number]  Chief Complaint  Patient presents with   Follow-up    ER follow up   Carrie Case returns to care today for hospital follow-up.  Recent ED presentation on 6/20 in the setting of A-fib with RVR.  She had been seen by cardiology earlier in the day and was found to be in A-fib with RVR.  She was asymptomatic, but mildly hypoxic.  ED evaluation recommended.  She remained asymptomatic on ED presentation and was again found to be in A-fib with RVR.  Hemodynamically stable.  Treated with IV diltiazem.  She was ultimately discharged home with close outpatient follow-up recommended.  Carrie Case reports feeling well today.  She is asymptomatic currently and does not have any acute concerns to discuss.  Past Medical History:  Diagnosis Date   Acute respiratory failure with hypoxia (HCC) 12/22/2018   Tobacco dependence    Past Surgical History:  Procedure Laterality Date   TUBAL LIGATION     Social History   Tobacco Use   Smoking status: Every Day    Packs/day: 0.50    Years: 47.00    Additional pack years: 0.00    Total pack years: 23.50    Types: Cigarettes    Passive exposure: Never   Smokeless tobacco: Never   Tobacco comments:    Smoking about 1/2 ppd per patient on 11/01/2021  Vaping Use   Vaping Use: Never used  Substance Use Topics   Alcohol use: No   Drug use: No   Family History  Problem Relation Age of Onset   Hypertension Mother    Hyperlipidemia Mother    Cancer Mother    COPD Mother    Anxiety disorder Mother    Allergies  Allergen Reactions   Ibuprofen Hives   Review of Systems  Constitutional:  Negative for chills and fever.  HENT:  Negative for sore throat.   Respiratory:  Negative for cough and shortness of breath.   Cardiovascular:  Negative for chest pain, palpitations and leg swelling.  Gastrointestinal:  Negative for  abdominal pain, blood in stool, constipation, diarrhea, nausea and vomiting.  Genitourinary:  Negative for dysuria and hematuria.  Musculoskeletal:  Negative for myalgias.  Skin:  Negative for itching and rash.  Neurological:  Negative for dizziness and headaches.  Psychiatric/Behavioral:  Negative for depression and suicidal ideas.      Objective:     BP 119/76 (BP Location: Right Arm, Patient Position: Sitting, Cuff Size: Normal)   Pulse 81   Ht 5\' 6"  (1.676 m)   Wt 130 lb (59 kg)   SpO2 93%   BMI 20.98 kg/m  BP Readings from Last 3 Encounters:  02/18/23 119/76  02/07/23 130/79  02/07/23 102/72   Physical Exam Vitals reviewed.  Constitutional:      General: She is not in acute distress.    Appearance: Normal appearance. She is not toxic-appearing.  HENT:     Head: Normocephalic and atraumatic.     Right Ear: External ear normal.     Left Ear: External ear normal.     Nose: Nose normal. No congestion or rhinorrhea.     Mouth/Throat:     Mouth: Mucous membranes are moist.     Pharynx: Oropharynx is clear. No oropharyngeal exudate or posterior oropharyngeal erythema.  Eyes:     General: No scleral icterus.  Extraocular Movements: Extraocular movements intact.     Conjunctiva/sclera: Conjunctivae normal.     Pupils: Pupils are equal, round, and reactive to light.  Cardiovascular:     Rate and Rhythm: Normal rate. Rhythm irregular.     Pulses: Normal pulses.     Heart sounds: Normal heart sounds. No murmur heard.    No friction rub. No gallop.  Pulmonary:     Effort: Pulmonary effort is normal.     Breath sounds: Normal breath sounds. No wheezing, rhonchi or rales.  Abdominal:     General: Abdomen is flat. Bowel sounds are normal. There is no distension.     Palpations: Abdomen is soft.     Tenderness: There is no abdominal tenderness.  Musculoskeletal:        General: No swelling. Normal range of motion.     Cervical back: Normal range of motion.     Right  lower leg: No edema.     Left lower leg: No edema.  Lymphadenopathy:     Cervical: No cervical adenopathy.  Skin:    General: Skin is warm and dry.     Capillary Refill: Capillary refill takes less than 2 seconds.     Coloration: Skin is not jaundiced.  Neurological:     General: No focal deficit present.     Mental Status: She is alert and oriented to person, place, and time.  Psychiatric:        Mood and Affect: Mood normal.        Behavior: Behavior normal.   Last CBC Lab Results  Component Value Date   WBC 9.1 02/07/2023   HGB 16.9 (H) 02/07/2023   HCT 50.9 (H) 02/07/2023   MCV 97.1 02/07/2023   MCH 32.3 02/07/2023   RDW 15.0 02/07/2023   PLT 253 02/07/2023   Last metabolic panel Lab Results  Component Value Date   GLUCOSE 94 02/07/2023   NA 138 02/07/2023   K 4.4 02/07/2023   CL 100 02/07/2023   CO2 30 02/07/2023   BUN 8 02/07/2023   CREATININE 1.02 (H) 02/07/2023   GFRNONAA >60 02/07/2023   CALCIUM 9.8 02/07/2023   PROT 7.6 06/18/2022   ALBUMIN 4.0 06/18/2022   LABGLOB 3.6 06/18/2022   AGRATIO 1.1 (L) 06/18/2022   BILITOT 0.2 06/18/2022   ALKPHOS 76 06/18/2022   AST 17 06/18/2022   ALT 10 06/18/2022   ANIONGAP 8 02/07/2023   Last lipids Lab Results  Component Value Date   CHOL 145 02/14/2022   HDL 47 02/14/2022   LDLCALC 80 02/14/2022   TRIG 94 02/14/2022   CHOLHDL 3.1 02/14/2022   Last hemoglobin A1c Lab Results  Component Value Date   HGBA1C 5.6 05/21/2022   Last thyroid functions Lab Results  Component Value Date   TSH 3.290 02/14/2022   T4TOTAL 6.3 02/14/2022   Last vitamin D Lab Results  Component Value Date   VD25OH 60.3 08/16/2022     Assessment & Plan:   Problem List Items Addressed This Visit       Atrial fibrillation with RVR (HCC) - Primary (Chronic)    Presenting today for follow-up in the setting of recent ER presentation for A-fib with RVR on 6/20.  It was recommended that she present to the ED for possible DCCV.   Treated with IV diltiazem in the emergency department.  She remained asymptomatic and was ultimately discharged home.  Carrie Case remains asymptomatic today.  Rhythm is irregular, but rate is controlled.  She  has been taking diltiazem 300 mg daily as prescribed, but reports only taking metoprolol tartrate 12.5 mg once daily instead of twice as prescribed.  She also reports that she often takes a full 25 mg tablet because the pills are difficult to split.  She has been taking Xarelto as prescribed. -No medication changes today -I have recommended that she take her medications as prescribed (diltiazem 300 mg daily, metoprolol tartrate 12.5 mg twice daily, and Xarelto 20 mg daily).  Consider switching to metoprolol succinate as she has difficulty splitting pills and taking metoprolol tartrate twice daily.  Cardiology follow-up is scheduled for 7/8.  She will return to care for routine follow-up with me in 4 weeks.       Return in about 4 weeks (around 03/18/2023).    Billie Lade, MD

## 2023-02-19 ENCOUNTER — Ambulatory Visit: Payer: Self-pay | Admitting: Licensed Clinical Social Worker

## 2023-02-19 NOTE — Patient Outreach (Signed)
Care Coordination   Initial Visit Note   02/19/2023 Name: Carrie Case MRN: [id number] DOB: [birth year]  Carrie Case is a 67 y.o. year old female who sees Carrie Case, Carrie Mellow, MD for primary care. I spoke with  Carrie Case by phone today.  What matters to the patients health and wellness today?  Patient is at risk for falls     Goals Addressed             This Visit's Progress    patient is at risk for falls       Interventions:  Discussed client needs with client. She spoke of fall risk . She spoke of sleeping difficulty. She said she sleeps on a love seat and does not sleep very many hours at a time Discussed family support. Daughter, Carrie Case , is supportive Reviewed program support for client with RN, LCSW and Pharmacist Reviewed transport needs. Carrie Case, daughter of client, transport client to appointments and to complete errnds Reviewed medication procurement Discussed back pain issues Discussed walking of client She has some difficulty walking. She has vision issues. She said she has cataract on each eye and cannot afford to have procedure to remove cataracts Discussed relaxation techniques: she likes to watch TV to relax. She likes to listen to music Discussed mood of client. She gets sad occasionally in managing medical needs.  She is taking medications as prescribed Talked of support from PCP, Dr. Christel Mormon.   Provided counseling support for client Discussed appetite. She said she has reduced appetite Discussed friendship with neighbors in apartment complex Receives small amount from [employer]. She gets food allowance from [place] to buy food. ($325.00 per month) Discussed meal preparation. She eats eggs often, eat hotdogs often Discussed support of cardiologist.  She discussed her diagnosis of A-Fib. Encouraged client to call LCSW as needed at [phone].          SDOH assessments and interventions completed:  Yes  SDOH  Interventions Today    Flowsheet Row Most Recent Value  SDOH Interventions   Depression Interventions/Treatment  Counseling  Physical Activity Interventions Other (Comments)  [decreased walking. she has COPD]  Stress Interventions Provide Counseling  [client has some concern about residence environment]        Care Coordination Interventions:  Yes, provided   Interventions Today    Flowsheet Row Most Recent Value  Chronic Disease   Chronic disease during today's visit Other  [spoke with client about client needs]  General Interventions   General Interventions Discussed/Reviewed General Interventions Discussed, [employer]  [discussed program support for client with RN, Pharmacist, and LCSW]  Exercise Interventions   Exercise Discussed/Reviewed Physical Activity  Physical Activity Discussed/Reviewed Physical Activity Discussed  Francis Dowse has a shower chair she uses]  Education Interventions   Education Provided Provided Education  Provided Verbal Education On Community Resources  Mental Health Interventions   Mental Health Discussed/Reviewed Anxiety, Coping Strategies  [regarding mood, she said she feels sad occasionally . She likes to watch TV to help her relax. She likes to read, knit.]  Nutrition Interventions   Nutrition Discussed/Reviewed Nutrition Discussed  Pharmacy Interventions   Pharmacy Dicussed/Reviewed Pharmacy Topics Discussed  Safety Interventions   Safety Discussed/Reviewed Fall Risk        Follow up plan: Follow up call scheduled for 03/25/23 at 11:00 AM     Encounter Outcome:  Pt. Visit Completed   Kelton Pillar.Carlin Attridge MSW, LCSW Licensed Visual merchandiser [facility] Care Management [phone]

## 2023-02-19 NOTE — Patient Instructions (Signed)
Visit Information  Thank you for taking time to visit with me today. Please don't hesitate to contact me if I can be of assistance to you.   Following are the goals we discussed today:   Goals Addressed             This Visit's Progress    patient is at risk for falls       Interventions:  Discussed client needs with client. She spoke of fall risk . She spoke of sleeping difficulty. She said she sleeps on a love seat and does not sleep very many hours at a time Discussed family support. Daughter, Tonie Griffith , is supportive Reviewed program support for client with RN, LCSW and Pharmacist Reviewed transport needs. Tonie Griffith, daughter of client, transport client to appointments and to complete errnds Reviewed medication procurement Discussed back pain issues Discussed walking of client She has some difficulty walking. She has vision issues. She said she has cataract on each eye and cannot afford to have procedure to remove cataracts Discussed relaxation techniques: she likes to watch TV to relax. She likes to listen to music Discussed mood of client. She gets sad occasionally in managing medical needs.  She is taking medications as prescribed Talked of support from PCP, Dr. Christel Mormon.   Provided counseling support for client Discussed appetite. She said she has reduced appetite Discussed friendship with neighbors in apartment complex Receives small amount from [employer]. She gets food allowance from [place] to buy food. ($325.00 per month) Discussed meal preparation. She eats eggs often, eat hotdogs often Discussed support of cardiologist.  She discussed her diagnosis of A-Fib. Encouraged client to call LCSW as needed at [phone].          Our next appointment is by telephone on 03/25/23 at 11:00 AM   Please call the care guide team at [phone] if you need to cancel or reschedule your appointment.   If you are experiencing a Mental Health or Behavioral  Health Crisis or need someone to talk to, please go to [facility] [address], [place] [phone])   The patient verbalized understanding of instructions, educational materials, and care plan provided today and DECLINED offer to receive copy of patient instructions, educational materials, and care plan.   The patient has been provided with contact information for the care management team and has been advised to call with any health related questions or concerns.   Kelton Pillar.Chaundra Abreu MSW, LCSW Licensed Visual merchandiser [facility] Care Management [phone]

## 2023-02-25 ENCOUNTER — Other Ambulatory Visit (HOSPITAL_COMMUNITY): Payer: Self-pay

## 2023-02-25 ENCOUNTER — Encounter: Payer: Self-pay | Admitting: Nurse Practitioner

## 2023-02-25 ENCOUNTER — Ambulatory Visit: Payer: Medicare HMO | Attending: Nurse Practitioner | Admitting: Nurse Practitioner

## 2023-02-25 VITALS — BP 114/78 | HR 62 | Ht 66.0 in | Wt 131.2 lb

## 2023-02-25 DIAGNOSIS — I6523 Occlusion and stenosis of bilateral carotid arteries: Secondary | ICD-10-CM

## 2023-02-25 DIAGNOSIS — I4891 Unspecified atrial fibrillation: Secondary | ICD-10-CM | POA: Diagnosis not present

## 2023-02-25 DIAGNOSIS — E785 Hyperlipidemia, unspecified: Secondary | ICD-10-CM

## 2023-02-25 DIAGNOSIS — J449 Chronic obstructive pulmonary disease, unspecified: Secondary | ICD-10-CM

## 2023-02-25 DIAGNOSIS — I739 Peripheral vascular disease, unspecified: Secondary | ICD-10-CM

## 2023-02-25 MED ORDER — METOPROLOL SUCCINATE ER 25 MG PO TB24
25.0000 mg | ORAL_TABLET | Freq: Every day | ORAL | 3 refills | Status: DC
  Filled 2023-02-25: qty 90, 90d supply, fill #0
  Filled 2023-05-22: qty 90, 90d supply, fill #1
  Filled 2023-08-17: qty 90, 90d supply, fill #2

## 2023-02-25 NOTE — Patient Instructions (Addendum)
Medication Instructions:  Your physician has recommended you make the following change in your medication: \ Start taking Toprol XL 25 Mg daily, instead of Metoprolol tartrate BID  Labwork: none  Testing/Procedures: none  Follow-Up: Your physician recommends that you schedule a follow-up appointment in: 3 Months with Philis Nettle   Any Other Special Instructions Will Be Listed Below (If Applicable).  [facility])  If you need a refill on your cardiac medications before your next appointment, please call your pharmacy.

## 2023-02-25 NOTE — Progress Notes (Signed)
Cardiology Office Note:  .   Date:  02/25/2023  ID:  Carrie Case, DOB 1955/12/09, MRN [id number] PCP: Billie Lade, MD  [facility] Providers Cardiologist:  Dina Rich, MD PV Cardiologist:  [name], MD    History of Present Illness: .   Carrie Case is a 67 y.o. female with a PMH of A-fib, PAD, HLD, carotid artery stenosis, and COPD, who presents today for 2 week follow-up.   Last saw her on February 07, 2023, was found to be in A-fib with RVR, O2 was low, was sent to ED for evaluation. Was given IV Cardizem. Pt requested d/c, instructed to have close f/u with cardiology.   Today she presents for follow-up. She states she is doing well. Denies any chest pain, shortness of breath, palpitations, syncope, presyncope, dizziness, orthopnea, PND, swelling or significant weight changes, acute bleeding, or claudication.   Studies Reviewed: Marland Kitchen    EKG Interpretation Date/Time:  Monday February 25 2023 08:59:15 EDT Ventricular Rate:  71 PR Interval:    QRS Duration:  84 QT Interval:  380 QTC Calculation: 412 R Axis:   88  Text Interpretation: Atrial fibrillation with early repolarization. Confirmed by Sharlene Dory [phone]) on 02/25/2023 9:04:24 AM   Echo 10/2022:  1. Left ventricular ejection fraction, by estimation, is 55 to 60%. The  left ventricle has normal function. The left ventricle has no regional  wall motion abnormalities. There is mild concentric left ventricular  hypertrophy. Left ventricular diastolic  parameters are indeterminate. The average left ventricular global  longitudinal strain is -19.6 %. The global longitudinal strain is normal.   2. Right ventricular systolic function is low normal. The right  ventricular size is normal. There is normal pulmonary artery systolic  pressure. The estimated right ventricular systolic pressure is 35.7 mmHg.   3. Left atrial size was severely dilated.   4. Right atrial size was moderately dilated.   5. The mitral  valve is degenerative. Mild mitral valve regurgitation.  Moderate mitral annular calcification.   6. The aortic valve is tricuspid. There is mild calcification of the  aortic valve. Aortic valve regurgitation is mild. Aortic valve sclerosis  is present, with no evidence of aortic valve stenosis. Aortic  regurgitation PHT measures 560 msec.   7. The inferior vena cava is normal in size with greater than 50%  respiratory variability, suggesting right atrial pressure of 3 mmHg.   Comparison(s): Prior images reviewed side by side. LVEF remains normal  range at 55-60%.   Risk Assessment/Calculations:    CHA2DS2-VASc Score = 3  This indicates a 3.2% annual risk of stroke. The patient's score is based upon: CHF History: 0 HTN History: 0 Diabetes History: 0 Stroke History: 0 Vascular Disease History: 1 Age Score: 1 Gender Score: 1   Physical Exam:   VS:  BP 114/78   Pulse 62   Ht 5\' 6"  (1.676 m)   Wt 131 lb 3.2 oz (59.5 kg)   SpO2 95%   BMI 21.18 kg/m    Wt Readings from Last 3 Encounters:  02/25/23 131 lb 3.2 oz (59.5 kg)  02/18/23 130 lb (59 kg)  02/07/23 129 lb (58.5 kg)    GEN: Well nourished, well developed in no acute distress NECK: No JVD; No carotid bruits CARDIAC: S1/S2, RRR, no murmurs, rubs, gallops RESPIRATORY:  Clear to auscultation without rales, wheezing or rhonchi  ABDOMEN: Soft, non-tender, non-distended EXTREMITIES:  No edema; No deformity   ASSESSMENT AND PLAN: .  A-fib EKG today reveals rate controlled A-fib.  Denies any palpitations.  She is requesting to switch Lopressor to Toprol XL.  Will initiate metoprolol succinate 25 mg daily.  Continue diltiazem.  Continue Xarelto 20 mg daily, denies any bleeding issues and on appropriate dosage. Recommended Kardia mobile device for monitoring her A-fib. At next OV, will discuss possible DCCV - will discuss further with Dr. Wyline Mood. Heart healthy diet and regular cardiovascular exercise encouraged.    PAD,  HLD, carotid artery stenosis Denies any  symptoms.  Asymptomatic left subclavian stenosis, bilateral 1 to 39% ICA stenosis.  LDL 80 01/2022.  Continue rosuvastatin.  Managed by PCP.  Continue to follow-up with Dr. Kirke Corin.   COPD Denies any recent symptoms.  Continue current medication regimen.  Continue follow-up with Dr. Sherene Sires.   Dispo: Follow-up with me or APP in 3 months or sooner if anything changes.  Signed, Sharlene Dory, NP

## 2023-03-02 NOTE — Progress Notes (Signed)
Carrie Case, female    DOB: February 06, 1956  MRN: [id number]   Brief patient profile:  75 yowf  active smoker  referred to pulmonary clinic in [place]  11/01/2021 by Carrie Case for doe. Proved to have GOLD 2 COPD criteria 02/2022    History of Present Illness  11/01/2021  Pulmonary/ 1st office eval/ Carrie Case / Carrie Case Office  Chief Complaint  Patient presents with   Consult    Referred by Carrie Case for Chronic bronchitis and COPD   Dyspnea:  MMRC2 = can't walk a nl pace on a flat grade s sob but does fine slow and flat no better on spiriva /some better on neb  Cough: none  Sleep: 3-4 x per week 30-45 degrees due to habit  SABA use: neb twice daily on avg - typically p exertion  Rec Plan A = Automatic = Always=    Spiriva one capsule each am  Plan B = Backup (to supplement plan A, not to replace it) Only use your albuterol inhaler  Plan C = Crisis (instead of Plan B but only if Plan B stops working) - only use your albuterol nebulizer if you first try Plan B    12/11/2022  f/u ov/Carrie Case office/Carrie Case re: GOLD 2  maint on Stiolto/ still smoking   Chief Complaint  Patient presents with   Follow-up    Pt f/u states that she is doing "ok", no questions or concerns  Dyspnea:  worse and "has to stop while working in kitchen 50 times / tightness in chest" not reproducible in office walking on day of ov x 300 ft Cough: none  Sleeping: sleeping in recliner same angle as usual x years  SABA use: rarely using  02: none  Rec Stop alendronate x 6 week trial as it may have made your chest tightness  Pantoprazole (protonix) 40 mg   Take  30-60 min before first meal of the day and Pepcid (famotidine)  20 mg after supper until return to office Also  Ok to try albuterol 15 min before an activity (on alternating days)  that you know would usually make you short of breath The key is to stop smoking completely before smoking completely stops you! Please schedule a follow up office visit in 6  weeks, call sooner if needed with all medications /inhalers/ solutions in hand   03/04/2023  f/u ov/ office/Carrie Case re: GOLD 2 copd  maint on stiolto(though very poor technique)   did  bring meds / did not start gerd rx/ breathing  worse x 3-4 m  assoc with fast heart rate ? in CAF "? For years" Chief Complaint  Patient presents with   COPD    Gold 2  Dyspnea:  10-15 min walking in front of appt/ [employer] each aisle stops gives out with chest tigthness that can last all day, not necessarily worse with exertion  Cough: says she has none but has a typical smoker's rattle/hack during interview Sleeping: in recliner with 45 degree no resp cc / no chest tightness  SABA use: not at all  02: none  Lung cancer screening : refused multiple times "I'd rather not know"    No obvious day to day or daytime variability or assoc excess/ purulent sputum or mucus plugs or hemoptysis or subjective wheeze or overt sinus or hb symptoms.   Sleeping as above  without nocturnal  or early am exacerbation  of respiratory  c/o's or need for noct saba. Also denies any obvious  fluctuation of symptoms with weather or environmental changes or other aggravating or alleviating factors except as outlined above   No unusual exposure hx or h/o childhood pna/ asthma or knowledge of premature birth.  Current Allergies, Complete Past Medical History, Past Surgical History, Family History, and Social History were reviewed in [employer] record.  ROS  The following are not active complaints unless bolded Hoarseness, sore throat, dysphagia, dental problems, itching, sneezing,  nasal congestion or discharge of excess mucus or purulent secretions, ear ache,   fever, chills, sweats, unintended wt loss or wt gain, classically pleuritic or exertional cp,  orthopnea pnd or arm/hand swelling  or leg swelling, presyncope, palpitations, abdominal pain, anorexia, nausea, vomiting, diarrhea  or change in bowel  habits or change in bladder habits, change in stools or change in urine, dysuria, hematuria,  rash, arthralgias, visual complaints, headache, numbness, weakness or ataxia or problems with walking or coordination,  change in mood or  memory.        Current Meds  Medication Sig   albuterol (PROVENTIL) (2.5 MG/3ML) 0.083% nebulizer solution Take 3 mLs (2.5 mg total) by nebulization every 6 (six) hours as needed for wheezing or shortness of breath.   diltiazem (CARTIA XT) 300 MG 24 hr capsule Take 1 capsule (300 mg total) by mouth daily.   metoprolol succinate (TOPROL XL) 25 MG 24 hr tablet Take 1 tablet (25 mg total) by mouth daily.   rivaroxaban (XARELTO) 20 MG TABS tablet Take 1 tablet (20 mg total) by mouth daily with supper.   rosuvastatin (CRESTOR) 20 MG tablet Take 1 tablet (20 mg total) by mouth daily.   Tiotropium Bromide-Olodaterol (STIOLTO RESPIMAT) 2.5-2.5 MCG/ACT AERS Inhale 2 puffs by mouth every morning.              Past Medical History:  Diagnosis Date   Acute respiratory failure with hypoxia (HCC) 12/22/2018   Tobacco dependence        Objective:    Wts   03/04/2023       127  12/11/2022       131   04/20/2022         131   02/14/22 132 lb 6.4 oz (60.1 kg)  11/01/21 137 lb (62.1 kg)  08/29/21 139 lb (63 kg)    Vital signs reviewed  03/04/2023  - Note at rest 02 sats  95% on RA  with pulse around 75 apically  General appearance:    anxious amb wf nad  / congested sounding cough   HEENT : Oropharynx clear     NECK :  without  apparent JVD/ palpable Nodes/TM    LUNGS: no acc muscle use,  Mild barrel  contour chest wall with bilateral  Distant bs s audible wheeze and  without cough on insp or exp maneuvers  and mild  Hyperresonant  to  percussion bilaterally     CV:  IRIR  no s3 or murmur or increase in P2, and no edema   ABD:  soft and nontender with pos end  insp Hoover's  in the supine position.  No bruits or organomegaly appreciated   MS:  Nl gait/ ext warm  without deformities Or obvious joint restrictions  calf tenderness, cyanosis or clubbing     SKIN: warm and dry without lesions    NEURO:  alert, approp, nl sensorium with  no motor or cerebellar deficits apparent.         Assessment

## 2023-03-04 ENCOUNTER — Encounter: Payer: Self-pay | Admitting: Internal Medicine

## 2023-03-04 ENCOUNTER — Ambulatory Visit: Payer: Medicare HMO | Admitting: Internal Medicine

## 2023-03-04 ENCOUNTER — Other Ambulatory Visit (HOSPITAL_COMMUNITY): Payer: Self-pay

## 2023-03-04 VITALS — BP 115/73 | HR 75 | Ht 66.0 in | Wt 127.0 lb

## 2023-03-04 DIAGNOSIS — F17210 Nicotine dependence, cigarettes, uncomplicated: Secondary | ICD-10-CM | POA: Diagnosis not present

## 2023-03-04 DIAGNOSIS — R0789 Other chest pain: Secondary | ICD-10-CM

## 2023-03-04 DIAGNOSIS — J449 Chronic obstructive pulmonary disease, unspecified: Secondary | ICD-10-CM

## 2023-03-04 MED ORDER — PANTOPRAZOLE SODIUM 40 MG PO TBEC
40.0000 mg | DELAYED_RELEASE_TABLET | Freq: Every day | ORAL | 2 refills | Status: DC
  Filled 2023-03-04: qty 30, 30d supply, fill #0
  Filled 2023-03-22 – 2023-03-29 (×2): qty 30, 30d supply, fill #1
  Filled 2023-04-27: qty 30, 30d supply, fill #2

## 2023-03-04 MED ORDER — FAMOTIDINE 20 MG PO TABS
20.0000 mg | ORAL_TABLET | Freq: Every day | ORAL | 11 refills | Status: DC
  Filled 2023-03-04: qty 30, 30d supply, fill #0
  Filled 2023-03-27: qty 30, 30d supply, fill #1
  Filled 2023-04-27: qty 30, 30d supply, fill #2
  Filled 2023-05-22: qty 30, 30d supply, fill #3
  Filled 2023-06-28 – 2023-06-29 (×2): qty 30, 30d supply, fill #4
  Filled 2023-07-27: qty 30, 30d supply, fill #5
  Filled 2023-09-05: qty 30, 30d supply, fill #6
  Filled 2023-10-02: qty 30, 30d supply, fill #7

## 2023-03-04 NOTE — Assessment & Plan Note (Addendum)
Counseled re importance of smoking cessation but did not meet time criteria for separate billing    Each maintenance medication was reviewed in detail including emphasizing most importantly the difference between maintenance and prns and under what circumstances the prns are to be triggered using an action plan format where appropriate.  Total time for H and P, chart review, counseling, reviewing  when to use neb  device(s) , directly observing portions of ambulatory 02 saturation study/ and generating customized AVS unique to this office visit / same day charting  > 40 min for multiple  refractory respiratory  symptoms of uncertain etiology.

## 2023-03-04 NOTE — Assessment & Plan Note (Signed)
Worse since rx fosamax suggesting possible GERD related cp  12/11/2022 > try off x 6 weeks and on max gerd rx/ f/u cards as well > did not follow instructions form 12/11/22 so try again 03/04/2023   see avs for instructions unique to this ov

## 2023-03-04 NOTE — Patient Instructions (Addendum)
The key is to stop smoking completely before smoking completely stops you!  Pantoprazole (protonix) 40 mg   Take  30-60 min before first meal of the day and Pepcid (famotidine)  20 mg after supper until return to office - this is the best way to tell whether stomach acid is contributing to your problem.     GERD (REFLUX)  is an extremely common cause of respiratory symptoms and chest tightness just like yours , many times with no obvious heartburn at all.    It can be treated with medication, but also with lifestyle changes including elevation of the head of your bed (ideally with 6 -8inch blocks under the headboard of your bed),  Smoking cessation, avoidance of late meals, excessive alcohol, and avoid fatty foods, chocolate, peppermint, colas, red wine, and acidic juices such as orange juice.  NO MINT OR MENTHOL PRODUCTS SO NO COUGH DROPS  USE SUGARLESS CANDY INSTEAD (Jolley ranchers or Stover's or Life Savers) or even ice chips will also do - the key is to swallow to prevent all throat clearing. NO OIL BASED VITAMINS - use powdered substitutes.  Avoid fish oil when coughing.   If you are having a bad day with the chest tightness go ahead and try the nebulizer if your heart rate is less than 100   Work on inhaler technique:  relax and gently blow all the way out then take a nice smooth full deep breath back in, triggering the inhaler at same time you start breathing in.  Hold breath in for at least  5 seconds if you can.   Rinse and gargle with water when done.  If mouth or throat bother you at all,  try brushing teeth/gums/tongue with arm and hammer toothpaste/ make a slurry and gargle and spit out.   You are cleared for cardioversion from a lung perspective.   Holter monitoring with a diary is the best way to sort out whether the fast atrial fib is the problem causing your symptoms or not   Please schedule a follow up office visit in 6 weeks, call sooner if needed

## 2023-03-04 NOTE — Assessment & Plan Note (Addendum)
Active smoking  - 11/01/2021  After extensive coaching inhaler device,  effectiveness =  75%> continue spiriva dpi and prn saba  - 11/01/2021   Walked on RA  x  3  lap(s) =  approx 450  ft  @ mod pace, stopped due to end of study, sob on 3rd lap with lowest 02 sats 94%  - PFT's  03/08/22  FEV1 1.62 (61 % ) ratio 0.69  p 5 % improvement from saba p  spiriva prior to study with DLCO  8.80 (41%)   and FV curve mild concavity   - 04/20/2022  After extensive coaching inhaler device,  effectiveness = 75%    SMI > try spiriva smi sample then rx with stiolto trial > f/u q 3 m   - 12/11/2022   Walked on RA  x  2  lap(s) =  approx 300  ft  @ slow pace, stopped due to sob  with lowest 02 sats 92% s cp /chest tightness   - 03/04/2023  After extensive coaching inhaler device,  effectiveness =  75% from a baseline of 25% (short Ti) with smi > continue stiolto and off fosamax (chest tightness) and max gerd rx then regroup in 6 weeks    - 03/04/2023   Walked on RA  x  1  lap(s) =  approx 150  ft  @ mod pace, stopped due to tired/chest tight "but no worse than at rest)  with lowest 02 sats 95% but no sob   Pt is Group B in terms of symptom/risk and laba/lama therefore appropriate rx at this point >>>  continue stiolto and approp saba as long as rapid afib not an issue which she should always check for before taking saba.  Re SABA :  I spent extra time with pt today reviewing appropriate use of albuterol for prn use on exertion with the following points: 1) saba is for relief of sob that does not improve by walking a slower pace or resting but rather if the pt does not improve after trying this first. 2) If the pt is convinced, as many are, that saba helps recover from activity faster then it's easy to tell if this is the case by re-challenging : ie stop, take the inhaler, then p 5 minutes try the exact same activity (intensity of workload) that just caused the symptoms and see if they are substantially diminished or not after  saba 3) if there is an activity that reproducibly causes the symptoms, try the saba 15 min before the activity on alternate days   If in fact the saba really does help, then fine to continue to use it prn but advised may need to look closer at the maintenance regimen (stiolto for now) being used to achieve better control of airways disease with exertion.    The chest tightness which can "last all day" but not worse with ex or at 45 degrees sleeping is more likely gerd than copd and she did not follow the last set of instructions so advised to try gerd rx/ diet and f/u here in 6 week with all meds in hand using a trust but verify approach to confirm accurate Medication  Reconciliation The principal here is that until we are certain that the  patients are doing what we've asked, it makes no sense to ask them to do more.   In meantime ok to use GA for cardioversion attempt and / or Amiodarone with the risk of the latter  on gas exchange acknowledged but manageable.

## 2023-03-05 ENCOUNTER — Other Ambulatory Visit: Payer: Self-pay

## 2023-03-23 ENCOUNTER — Other Ambulatory Visit (HOSPITAL_COMMUNITY): Payer: Self-pay

## 2023-03-25 ENCOUNTER — Ambulatory Visit (INDEPENDENT_AMBULATORY_CARE_PROVIDER_SITE_OTHER): Payer: Medicare HMO | Admitting: Internal Medicine

## 2023-03-25 ENCOUNTER — Encounter: Payer: Self-pay | Admitting: Internal Medicine

## 2023-03-25 ENCOUNTER — Encounter: Payer: Medicare HMO | Admitting: Licensed Clinical Social Worker

## 2023-03-25 ENCOUNTER — Other Ambulatory Visit (HOSPITAL_COMMUNITY): Payer: Self-pay

## 2023-03-25 VITALS — BP 96/68 | HR 66 | Resp 16 | Ht 66.0 in | Wt 135.0 lb

## 2023-03-25 DIAGNOSIS — M810 Age-related osteoporosis without current pathological fracture: Secondary | ICD-10-CM

## 2023-03-25 DIAGNOSIS — D751 Secondary polycythemia: Secondary | ICD-10-CM | POA: Diagnosis not present

## 2023-03-25 DIAGNOSIS — F5104 Psychophysiologic insomnia: Secondary | ICD-10-CM

## 2023-03-25 DIAGNOSIS — H9223 Otorrhagia, bilateral: Secondary | ICD-10-CM | POA: Diagnosis not present

## 2023-03-25 MED ORDER — HYDROXYZINE PAMOATE 25 MG PO CAPS
25.0000 mg | ORAL_CAPSULE | Freq: Three times a day (TID) | ORAL | 0 refills | Status: DC | PRN
  Filled 2023-03-25: qty 30, 10d supply, fill #0

## 2023-03-25 NOTE — Assessment & Plan Note (Signed)
She noted bleeding from both ears earlier today after using Q-tips to clean them.  This is the first time that this has happened.  There has been no change in hearing or significant discomfort.  Blood noted in each ear canal, but no additional acute findings.  I suspect that blood is secondary to irritation from Q-tips.  She was counseled on avoiding future use of Q-tips to clean her ears.

## 2023-03-25 NOTE — Assessment & Plan Note (Signed)
Persistent issue.  Trazodone 50 mg nightly was prescribed at her last appointment, but she states that it was ineffective.  She is interested in additional treatment options today.  Her insomnia is largely attributed to stress.  She reports that Prozac was previously effective in helping her to sleep. -Hydroxyzine 25 mg nightly as needed for insomnia has been prescribed today

## 2023-03-25 NOTE — Progress Notes (Signed)
Established Patient Office Visit  Subjective   Patient ID: Carrie Case, female    DOB: 1956/08/02  Age: 67 y.o. MRN: [id number]  Chief Complaint  Patient presents with   ears bleeding     Took a shower and cleaned her ears and bright red blood was on the qtip. Tender in both ears    Medication Problem    Doesn't know why she is on famotadine and protonix. Never has complained about reflux    Carrie Case returns to care today for follow-up.  She has multiple acute concerns to discuss.  She reports that she noted bleeding in both ears this morning after cleaning them with Q-tips.  This is the first time that this has occurred.  She has also recently been started on Pepcid and Protonix in the setting of chest tightness with suspected GERD/esophagitis secondary to Fosamax.  She would like to know why she is taking both Pepcid and Protonix.  Lastly, she endorses persistent insomnia.  Trazodone was prescribed at her last appointment, but proved ineffective.  She is interested in additional treatment options today.  Past Medical History:  Diagnosis Date   Acute respiratory failure with hypoxia (HCC) 12/22/2018   Tobacco dependence    Past Surgical History:  Procedure Laterality Date   TUBAL LIGATION     Social History   Tobacco Use   Smoking status: Every Day    Current packs/day: 0.50    Average packs/day: 0.5 packs/day for 47.0 years (23.5 ttl pk-yrs)    Types: Cigarettes    Passive exposure: Never   Smokeless tobacco: Never   Tobacco comments:    Smoking about 1/2 ppd per patient on 11/01/2021  Vaping Use   Vaping status: Never Used  Substance Use Topics   Alcohol use: No   Drug use: No   Family History  Problem Relation Age of Onset   Hypertension Mother    Hyperlipidemia Mother    Cancer Mother    COPD Mother    Anxiety disorder Mother    Allergies  Allergen Reactions   Ibuprofen Hives   Review of Systems  HENT:         Bleeding ears (bilateral)   Psychiatric/Behavioral:  The patient has insomnia.   All other systems reviewed and are negative.     Objective:     BP 96/68   Pulse 66   Resp 16   Ht 5\' 6"  (1.676 m)   Wt 135 lb (61.2 kg)   SpO2 (!) 89%   BMI 21.79 kg/m  BP Readings from Last 3 Encounters:  03/25/23 96/68  03/04/23 115/73  02/25/23 114/78   Physical Exam Vitals reviewed.  Constitutional:      General: She is not in acute distress.    Appearance: Normal appearance. She is not toxic-appearing.  HENT:     Head: Normocephalic and atraumatic.     Right Ear: Tympanic membrane and external ear normal.     Left Ear: Tympanic membrane and external ear normal.     Ears:     Comments: Blood noted in each ear canal. No trauma. TM intact. No concern for infection.    Nose: Nose normal. No congestion or rhinorrhea.     Mouth/Throat:     Mouth: Mucous membranes are moist.     Pharynx: Oropharynx is clear. No oropharyngeal exudate or posterior oropharyngeal erythema.  Eyes:     General: No scleral icterus.    Extraocular Movements: Extraocular movements intact.  Conjunctiva/sclera: Conjunctivae normal.     Pupils: Pupils are equal, round, and reactive to light.  Cardiovascular:     Rate and Rhythm: Normal rate and regular rhythm.     Pulses: Normal pulses.     Heart sounds: Normal heart sounds. No murmur heard.    No friction rub. No gallop.  Pulmonary:     Effort: Pulmonary effort is normal.     Breath sounds: Normal breath sounds. No wheezing, rhonchi or rales.  Abdominal:     General: Abdomen is flat. Bowel sounds are normal. There is no distension.     Palpations: Abdomen is soft.     Tenderness: There is no abdominal tenderness.  Musculoskeletal:        General: No swelling. Normal range of motion.     Cervical back: Normal range of motion.     Right lower leg: No edema.     Left lower leg: No edema.  Lymphadenopathy:     Cervical: No cervical adenopathy.  Skin:    General: Skin is warm and  dry.     Capillary Refill: Capillary refill takes less than 2 seconds.     Coloration: Skin is not jaundiced.  Neurological:     General: No focal deficit present.     Mental Status: She is alert and oriented to person, place, and time.  Psychiatric:        Mood and Affect: Mood normal.        Behavior: Behavior normal.     Last CBC Lab Results  Component Value Date   WBC 9.1 02/07/2023   HGB 16.9 (H) 02/07/2023   HCT 50.9 (H) 02/07/2023   MCV 97.1 02/07/2023   MCH 32.3 02/07/2023   RDW 15.0 02/07/2023   PLT 253 02/07/2023   Last metabolic panel Lab Results  Component Value Date   GLUCOSE 94 02/07/2023   NA 138 02/07/2023   K 4.4 02/07/2023   CL 100 02/07/2023   CO2 30 02/07/2023   BUN 8 02/07/2023   CREATININE 1.02 (H) 02/07/2023   GFRNONAA >60 02/07/2023   CALCIUM 9.8 02/07/2023   PROT 7.6 06/18/2022   ALBUMIN 4.0 06/18/2022   LABGLOB 3.6 06/18/2022   AGRATIO 1.1 (L) 06/18/2022   BILITOT 0.2 06/18/2022   ALKPHOS 76 06/18/2022   AST 17 06/18/2022   ALT 10 06/18/2022   ANIONGAP 8 02/07/2023   Last lipids Lab Results  Component Value Date   CHOL 145 02/14/2022   HDL 47 02/14/2022   LDLCALC 80 02/14/2022   TRIG 94 02/14/2022   CHOLHDL 3.1 02/14/2022   Last hemoglobin A1c Lab Results  Component Value Date   HGBA1C 5.6 05/21/2022   Last thyroid functions Lab Results  Component Value Date   TSH 3.290 02/14/2022   T4TOTAL 6.3 02/14/2022   Last vitamin D Lab Results  Component Value Date   VD25OH 60.3 08/16/2022   Last vitamin B12 and Folate No results found for: "VITAMINB12", "FOLATE"    The ASCVD Risk score (Arnett DK, et al., 2019) failed to calculate for the following reasons:   The patient has a prior MI or stroke diagnosis    Assessment & Plan:   Problem List Items Addressed This Visit       Ear bleeding, bilateral    She noted bleeding from both ears earlier today after using Q-tips to clean them.  This is the first time that this  has happened.  There has been no change in hearing or  significant discomfort.  Blood noted in each ear canal, but no additional acute findings.  I suspect that blood is secondary to irritation from Q-tips.  She was counseled on avoiding future use of Q-tips to clean her ears.      Osteoporosis    Fosamax recently discontinued due to suspected pill induced esophagitis as she reported chest pain during a recent pulmonology visit.  She is currently prescribed Pepcid and Protonix.  Asymptomatic currently. -Consider additional treatment options for osteoporosis at her next follow-up appointment      Insomnia    Persistent issue.  Trazodone 50 mg nightly was prescribed at her last appointment, but she states that it was ineffective.  She is interested in additional treatment options today.  Her insomnia is largely attributed to stress.  She reports that Prozac was previously effective in helping her to sleep. -Hydroxyzine 25 mg nightly as needed for insomnia has been prescribed today      Erythrocytosis - Primary    Noted recent labs.  Repeat CBC ordered today.       Return in about 3 months (around 06/25/2023).    Billie Lade, MD

## 2023-03-25 NOTE — Assessment & Plan Note (Signed)
Noted recent labs.  Repeat CBC ordered today.

## 2023-03-25 NOTE — Patient Instructions (Signed)
It was a pleasure to see you today.  Thank you for giving [place] the opportunity to be involved in your care.  Below is a brief recap of your visit and next steps.  We will plan to see you again in 3 months.  Summary Try hydroxyzine for sleep Repeat cell count Follow up in 3 months

## 2023-03-25 NOTE — Assessment & Plan Note (Addendum)
Fosamax recently discontinued due to suspected pill induced esophagitis as she reported chest pain during a recent pulmonology visit.  She is currently prescribed Pepcid and Protonix.  Asymptomatic currently. -Consider additional treatment options for osteoporosis at her next follow-up appointment

## 2023-03-26 ENCOUNTER — Ambulatory Visit: Payer: Self-pay | Admitting: Licensed Clinical Social Worker

## 2023-03-26 NOTE — Patient Instructions (Signed)
Visit Information  Thank you for taking time to visit with me today. Please don't hesitate to contact me if I can be of assistance to you.   Following are the goals we discussed today:   Goals Addressed             This Visit's Progress    patient is at risk for falls       Interventions:  Discussed client needs with client via phone today . LCSW spoke with client about her risk for falls. Client said she had not had a fall in one year Spoke with client about sleeping issues. She has difficulty sleeping. She sleeps in a recliner Discussed family support. Daughter, Tonie Griffith , is supportive of client. Daughter transports client as needed. Daughter helps client obtain needed grocery items Reviewed program support for client with RN, LCSW and Pharmacist Reviewed medication procurement. Client has prescribed medications  Discussed back pain issues Discussed walking of client She has some difficulty walking. She is not using a device to help her walk. Discussed relaxation techniques: she likes to watch TV to relax. She likes to listen to music Discussed mood of client. She gets sad occasionally in managing medical needs.  She is taking medications as prescribed. She does have strong support from her daughter and this is very helpful to client Talked of support from PCP, Dr. Christel Mormon.   Provided counseling support for client Discussed appetite. She said she has reduced appetite Discussed friendship with neighbors in apartment complex. She said she enjoys talking with her neighbors at apartment complex Thanked client for phone call with LCSW. Encouraged client to call LCSW as needed at [phone].          Our next appointment is by telephone on 05/20/23 at 9:00 AM   Please call the care guide team at [phone] if you need to cancel or reschedule your appointment.   If you are experiencing a Mental Health or Behavioral Health Crisis or need someone to talk to, please  go to [facility] [address], [place] [phone])   The patient verbalized understanding of instructions, educational materials, and care plan provided today and DECLINED offer to receive copy of patient instructions, educational materials, and care plan.   The patient has been provided with contact information for the care management team and has been advised to call with any health related questions or concerns.   Kelton Pillar. MSW, LCSW Licensed Visual merchandiser [facility] Care Management [phone]

## 2023-03-26 NOTE — Patient Outreach (Signed)
  Care Coordination   Follow Up Visit Note   03/26/2023 Name: SADAE FLEETWOOD MRN: [id number] DOB: 1956-02-07  BALEE IGNACIO is a 67 y.o. year old female who sees [name], [name], MD for primary care. I spoke with  [name] by phone today.  What matters to the patients health and wellness today? Patient is at risk for falls    Goals Addressed             This Visit's Progress    patient is at risk for falls       Interventions:  Discussed client needs with client via phone today . LCSW spoke with client about her risk for falls. Client said she had not had a fall in one year Spoke with client about sleeping issues. She has difficulty sleeping. She sleeps in a recliner Discussed family support. Daughter, Tonie Griffith , is supportive of client. Daughter transports client as needed. Daughter helps client obtain needed grocery items Reviewed program support for client with RN, LCSW and Pharmacist Reviewed medication procurement. Client has prescribed medications  Discussed back pain issues Discussed walking of client She has some difficulty walking. She is not using a device to help her walk. Discussed relaxation techniques: she likes to watch TV to relax. She likes to listen to music Discussed mood of client. She gets sad occasionally in managing medical needs.  She is taking medications as prescribed. She does have strong support from her daughter and this is very helpful to client Talked of support from PCP, Dr. Christel Mormon.   Provided counseling support for client Discussed appetite. She said she has reduced appetite Discussed friendship with neighbors in apartment complex. She said she enjoys talking with her neighbors at apartment complex Thanked client for phone call with LCSW. Encouraged client to call LCSW as needed at [phone].          SDOH assessments and interventions completed:  Yes  SDOH Interventions Today    Flowsheet Row Most Recent Value   SDOH Interventions   Depression Interventions/Treatment  Counseling  Physical Activity Interventions Other (Comments)  [some walking challenges]  Stress Interventions Provide Counseling  [has stress in managing medical needs]        Care Coordination Interventions:  Yes, provided   Interventions Today    Flowsheet Row Most Recent Value  Chronic Disease   Chronic disease during today's visit Other  [spoke with client about client needs]  General Interventions   General Interventions Discussed/Reviewed General Interventions Discussed, Scientist, water quality program support with RN, Teacher, early years/pre, LCSW]  Exercise Interventions   Exercise Discussed/Reviewed Physical Activity  Education Interventions   Education Provided Provided Education  Provided Verbal Education On [employer]  [discussed client support by cardiologist]  Mental Health Interventions   Mental Health Discussed/Reviewed Coping Strategies, Anxiety  [reviewed mood of client.She gets sad occasionally related to managing medical needs]  Nutrition Interventions   Nutrition Discussed/Reviewed Nutrition Discussed  Pharmacy Interventions   Pharmacy Dicussed/Reviewed Pharmacy Topics Discussed  Safety Interventions   Safety Discussed/Reviewed Fall Risk        Follow up plan: Follow up call scheduled for 05/20/23 at 9:00 AM    Encounter Outcome:  Pt. Visit Completed   Kelton Pillar. MSW, LCSW Licensed Visual merchandiser [facility] Care Management [phone]

## 2023-03-27 ENCOUNTER — Other Ambulatory Visit (HOSPITAL_COMMUNITY): Payer: Self-pay

## 2023-03-29 ENCOUNTER — Other Ambulatory Visit: Payer: Self-pay

## 2023-04-06 ENCOUNTER — Other Ambulatory Visit (HOSPITAL_COMMUNITY): Payer: Self-pay

## 2023-04-06 NOTE — Progress Notes (Unsigned)
Carrie Case, female    DOB: 1956/08/07  MRN: [id number]   Brief patient profile:  72   yowf  active smoker  referred to pulmonary clinic in [place]  11/01/2021 by Dr Dolphus Jenny for doe.  Proved to have GOLD 2 COPD criteria 02/2022    History of Present Illness  11/01/2021  Pulmonary/ 1st office eval/ [name] / [place] [facility]  Chief Complaint  Patient presents with   Consult    Referred by Dr. [name] for Chronic bronchitis and COPD   Dyspnea:  MMRC2 = can't walk a nl pace on a flat grade s sob but does fine slow and flat no better on spiriva /some better on neb  Cough: none  Sleep: 3-4 x per week 30-45 degrees due to habit  SABA use: neb twice daily on avg - typically p exertion  Rec Plan A = Automatic = Always=    Spiriva one capsule each am  Plan B = Backup (to supplement plan A, not to replace it) Only use your albuterol inhaler  Plan C = Crisis (instead of Plan B but only if Plan B stops working) - only use your albuterol nebulizer if you first try Plan B    12/11/2022  f/u ov/[place] office/[name] re: GOLD 2  maint on Stiolto/ still smoking   Chief Complaint  Patient presents with   Follow-up    Pt f/u states that she is doing "ok", no questions or concerns  Dyspnea:  worse and "has to stop while working in kitchen 50 times / tightness in chest" not reproducible in office walking on day of ov x 300 ft Cough: none  Sleeping: sleeping in recliner same angle as usual x years  SABA use: rarely using  02: none  Rec Stop alendronate x 6 week trial as it may have made your chest tightness  Pantoprazole (protonix) 40 mg   Take  30-60 min before first meal of the day and Pepcid (famotidine)  20 mg after supper until return to office Also  Ok to try albuterol 15 min before an activity (on alternating days)  that you know would usually make you short of breath The key is to stop smoking completely before smoking completely stops you! Please schedule a follow up office visit  in 6 weeks, call sooner if needed with all medications /inhalers/ solutions in hand   03/04/2023  f/u ov/[place] office/[name] re: GOLD 2 copd  maint on stiolto(though very poor technique)   did  bring meds / did not start gerd rx/ breathing  worse x 3-4 m  assoc with fast heart rate ? in CAF "? For years" Chief Complaint  Patient presents with   COPD    Gold 2  Dyspnea:  10-15 min walking in front of appt/ [employer] each aisle stops gives out with chest tigthness that can last all day, not necessarily worse with exertion  Cough: says she has none but has a typical smoker's rattle/hack during interview Sleeping: in recliner with 45 degree no resp cc / no chest tightness  SABA use: not at all  02: none  Lung cancer screening : refused multiple times "I'd rather not know"  Rec The key is to stop smoking completely before smoking completely stops you! Pantoprazole (protonix) 40 mg   Take  30-60 min before first meal of the day and Pepcid (famotidine)  20 mg after supper until return to office - this is the best way to tell whether stomach acid is  contributing to your problem.    GERD diet reviewed, bed blocks rec  If you are having a bad day with the chest tightness go ahead and try the nebulizer if your heart rate is less than 100  Work on inhaler technique:   You are cleared for cardioversion from a lung perspective.   Holter monitoring with a diary is the best way to sort out whether the fast atrial fib is the problem causing your symptoms or not     04/08/2023  6 week f/u ov/[place] office/[name] re: GOLD 2 copd maint on stiolto  Chief Complaint  Patient presents with   COPD    Gold 2, still smoking  Dyspnea:  not doing [employer] /  walking in from of appt s chest tightness  Cough: none  Sleeping: recliner at 45 degrees s resp cc  SABA use: none  02: none  Lung ca screening > refuses      No obvious day to day or daytime variability or assoc excess/ purulent sputum or mucus  plugs or hemoptysis or cp or chest tightness, subjective wheeze or overt sinus or hb symptoms.     Also denies any obvious fluctuation of symptoms with weather or environmental changes or other aggravating or alleviating factors except as outlined above   No unusual exposure hx or h/o childhood pna/ asthma or knowledge of premature birth.  Current Allergies, Complete Past Medical History, Past Surgical History, Family History, and Social History were reviewed in [employer] record.  ROS  The following are not active complaints unless bolded Hoarseness, sore throat, dysphagia, dental problems, itching, sneezing,  nasal congestion or discharge of excess mucus or purulent secretions, ear ache,   fever, chills, sweats, unintended wt loss or wt gain, classically pleuritic or exertional cp,  orthopnea pnd or arm/hand swelling  or leg swelling, presyncope, palpitations, abdominal pain, anorexia, nausea, vomiting, diarrhea  or change in bowel habits or change in bladder habits, change in stools or change in urine, dysuria, hematuria,  rash, arthralgias, visual complaints, headache, numbness, weakness or ataxia or problems with walking or coordination,  change in mood or  memory.        Current Meds  Medication Sig   albuterol (PROVENTIL) (2.5 MG/3ML) 0.083% nebulizer solution Take 3 mLs (2.5 mg total) by nebulization every 6 (six) hours as needed for wheezing or shortness of breath.   diltiazem (CARTIA XT) 300 MG 24 hr capsule Take 1 capsule (300 mg total) by mouth daily.   famotidine (PEPCID) 20 MG tablet Take 1 tablet (20 mg total) by mouth daily after supper.   hydrOXYzine (VISTARIL) 25 MG capsule Take 1 capsule (25 mg total) by mouth every 8 (eight) hours as needed for anxiety (insmonia).   metoprolol succinate (TOPROL XL) 25 MG 24 hr tablet Take 1 tablet (25 mg total) by mouth daily.   pantoprazole (PROTONIX) 40 MG tablet Take 1 tablet (40 mg total) by mouth daily. Take 30-60  min before first meal of the day   rivaroxaban (XARELTO) 20 MG TABS tablet Take 1 tablet (20 mg total) by mouth daily with supper.   rosuvastatin (CRESTOR) 20 MG tablet Take 1 tablet (20 mg total) by mouth daily.   Tiotropium Bromide-Olodaterol (STIOLTO RESPIMAT) 2.5-2.5 MCG/ACT AERS Inhale 2 puffs by mouth every morning.             Past Medical History:  Diagnosis Date   Acute respiratory failure with hypoxia (HCC) 12/22/2018   Tobacco dependence  Objective:    Wts   04/08/2023       130  03/04/2023       127  12/11/2022       131   04/20/2022         131   02/14/22 132 lb 6.4 oz (60.1 kg)  11/01/21 137 lb (62.1 kg)  08/29/21 139 lb (63 kg)    Vital signs reviewed  04/08/2023  - Note at rest 02 sats  91% on RA   General appearance:    stoic amb wf nad   HEENT : Oropharynx  clear    NECK :  without  apparent JVD/ palpable Nodes/TM    LUNGS: no acc muscle use,  Mild barrel  contour chest wall with bilateral  Distant bs s audible wheeze and a few crackles R > L base  and  without cough on insp or exp maneuvers  and mild  Hyperresonant  to  percussion bilaterally     CV:  RRR  no s3 or murmur or increase in P2, and no edema   ABD:  soft and nontender with pos end  insp Hoover's  in the supine position.  No bruits or organomegaly appreciated   MS:  Nl gait/ ext warm without deformities Or obvious joint restrictions  calf tenderness, cyanosis or clubbing     SKIN: warm and dry without lesions    NEURO:  alert, approp, nl sensorium with  no motor or cerebellar deficits apparent.       Cxr ordered 04/08/2023 > did not go as rec  Assessment

## 2023-04-08 ENCOUNTER — Ambulatory Visit (INDEPENDENT_AMBULATORY_CARE_PROVIDER_SITE_OTHER): Payer: Medicare HMO | Admitting: Internal Medicine

## 2023-04-08 ENCOUNTER — Encounter: Payer: Self-pay | Admitting: Internal Medicine

## 2023-04-08 VITALS — BP 114/81 | HR 77 | Ht 66.0 in | Wt 130.0 lb

## 2023-04-08 DIAGNOSIS — F17210 Nicotine dependence, cigarettes, uncomplicated: Secondary | ICD-10-CM

## 2023-04-08 DIAGNOSIS — J449 Chronic obstructive pulmonary disease, unspecified: Secondary | ICD-10-CM

## 2023-04-08 DIAGNOSIS — R0789 Other chest pain: Secondary | ICD-10-CM

## 2023-04-08 NOTE — Assessment & Plan Note (Addendum)
Worse since rx fosamax suggesting possible GERD related cp  12/11/2022 > try off x 6 weeks and on max gerd rx/ f/u cards as well > did not follow instructions form 12/11/22 so try again 03/04/2023 > resolve as of 04/08/2023 off fosamax and on gerd rx  Once back to no level of acivity ok to try back off gerd rx (now that off fosamax ) and see if still having chest tightness and f/u with PCP or GI for gerd rx if this turns out to be the case.         Each maintenance medication was reviewed in detail including emphasizing most importantly the difference between maintenance and prns and under what circumstances the prns are to be triggered using an action plan format where appropriate.         Each maintenance medication was reviewed in detail including emphasizing most importantly the difference between maintenance and prns and under what circumstances the prns are to be triggered using an action plan format where appropriate.  Total time for H and P, chart review, counseling, reviewing hfa/smi/neb  device(s) , directly observing portions of ambulatory 02 saturation study/ and generating customized AVS unique to this office visit / same day charting = 30 min

## 2023-04-08 NOTE — Assessment & Plan Note (Signed)
Active smoking  - 11/01/2021  After extensive coaching inhaler device,  effectiveness =  75%> continue spiriva dpi and prn saba  - 11/01/2021   Walked on RA  x  3  lap(s) =  approx 450  ft  @ mod pace, stopped due to end of study, sob on 3rd lap with lowest 02 sats 94%  - PFT's  03/08/22  FEV1 1.62 (61 % ) ratio 0.69  p 5 % improvement from saba p  spiriva prior to study with DLCO  8.80 (41%)   and FV curve mild concavity   - 04/20/2022  After extensive coaching inhaler device,  effectiveness = 75%    SMI > try spiriva smi sample then rx with stiolto trial > f/u q 3 m   - 12/11/2022   Walked on RA  x  2  lap(s) =  approx 300  ft  @ slow pace, stopped due to sob  with lowest 02 sats 92% s cp /chest tightness   - 03/04/2023  After extensive coaching inhaler device,  effectiveness =  75% from a baseline of 25% (short Ti) with smi > continue stiolto and off fosamax (chest tightness) and max gerd rx then regroup in 6 weeks    - 03/04/2023   Walked on RA  x  1  lap(s) =  approx 150  ft  @ mod pace, stopped due to tired/chest tight "but no worse than at rest)  with lowest 02 sats 95% but no sob  - 04/08/2023   Walked on RA  x  2  lap(s) =  approx 300  ft  @ nl  pace, stopped due to fatigue s chest tight or sob  with lowest 02 sats 91%    Pt is Group B in terms of symptom/risk and laba/lama therefore appropriate rx at this point >>>  continue stiolto and approp saba

## 2023-04-08 NOTE — Assessment & Plan Note (Signed)

## 2023-04-08 NOTE — Patient Instructions (Addendum)
Stay on reflux medications until you are fully as active as you were prior to taking the medicine and if chest tightness with exertion  still present while on medications then call me, otherwise ok to try off to see if it comes back in which case you'll need to stay on it until you return to see your PCP   The key is to stop smoking completely before smoking completely stops you!  Also  Ok to try albuterol neb 15 min before an activity (on alternating days)  that you know would usually make you short of breath and see if it makes any difference and if makes none then don't take albuterol after activity unless you can't catch your breath as this means it's the resting that helps, not the albuterol.  Please remember to go to the  x-ray department  @  [facility] for your tests - we will call you with the results when they are available      Please schedule a follow up visit in 6 months but call sooner if needed

## 2023-04-27 ENCOUNTER — Other Ambulatory Visit (HOSPITAL_COMMUNITY): Payer: Self-pay

## 2023-04-30 ENCOUNTER — Other Ambulatory Visit (HOSPITAL_COMMUNITY): Payer: Self-pay

## 2023-05-08 ENCOUNTER — Other Ambulatory Visit: Payer: Self-pay

## 2023-05-08 ENCOUNTER — Other Ambulatory Visit: Payer: Self-pay | Admitting: Cardiology

## 2023-05-08 DIAGNOSIS — I4891 Unspecified atrial fibrillation: Secondary | ICD-10-CM

## 2023-05-08 MED ORDER — RIVAROXABAN 20 MG PO TABS
20.0000 mg | ORAL_TABLET | Freq: Every day | ORAL | 1 refills | Status: DC
  Filled 2023-05-08: qty 90, 90d supply, fill #0

## 2023-05-08 NOTE — Telephone Encounter (Signed)
Xarelto 20mg  refill request received. Pt is 66 years old, weight-59kg, Crea-1.02 on 02/07/23, last seen by Sharlene Dory, NP on 02/25/23, Diagnosis-Afib, CrCl-50.53 mL/min; Dose is appropriate based on dosing criteria. Will send in refill to requested pharmacy.    PharmD advised to continue current xarelto 20mg  dose.

## 2023-05-09 ENCOUNTER — Other Ambulatory Visit: Payer: Self-pay

## 2023-05-09 ENCOUNTER — Other Ambulatory Visit (HOSPITAL_COMMUNITY): Payer: Self-pay

## 2023-05-12 ENCOUNTER — Other Ambulatory Visit: Payer: Self-pay | Admitting: Internal Medicine

## 2023-05-13 ENCOUNTER — Other Ambulatory Visit: Payer: Self-pay

## 2023-05-13 MED ORDER — STIOLTO RESPIMAT 2.5-2.5 MCG/ACT IN AERS
2.0000 | INHALATION_SPRAY | Freq: Every morning | RESPIRATORY_TRACT | 11 refills | Status: DC
  Filled 2023-05-13: qty 4, 30d supply, fill #0
  Filled 2023-06-08: qty 4, 30d supply, fill #1
  Filled 2023-07-01 (×2): qty 4, 30d supply, fill #2
  Filled 2023-08-02: qty 4, 30d supply, fill #3
  Filled 2023-08-30: qty 4, 30d supply, fill #4
  Filled 2023-09-26: qty 4, 30d supply, fill #5

## 2023-05-20 ENCOUNTER — Ambulatory Visit: Payer: Self-pay | Admitting: Licensed Clinical Social Worker

## 2023-05-20 NOTE — Patient Outreach (Signed)
Care Coordination   Follow Up Visit Note   05/20/2023 Name: Carrie Case MRN: [id number] DOB: 1956/02/27  Carrie Case is a 67 y.o. year old female who sees [name], [name], MD for primary care. I spoke with  [name] by phone today.  What matters to the patients health and wellness today? At risk for falls. Concerned over managing health needs     Goals Addressed             This Visit's Progress    patient is at risk for falls. Patient is concerned over managing health needs       Interventions:  Discussed client needs with client via phone today . LCSW spoke with client about her risk for falls. Client said she had not had a fall in one year. Client is concerned over managing her health needs Client said she is fatigued regularly. She gets short of breath sometimes when walking. She uses inhaler as prescribed. She has nebulizer to use as needed Spoke with client about sleeping issues. She has difficulty sleeping. She sleeps in a recliner Discussed family support. Daughter, Tonie Griffith , is supportive of client. Daughter transports client as needed. Daughter helps client obtain needed grocery items Reviewed program support for client with RN, LCSW and Pharmacist Reviewed medication procurement. Client has prescribed medications  Discussed back pain issues. She has ongoing back pain issues during the day Provided counseling support for client Discussed walking of client She has some difficulty walking. She is not using a device to help her walk. Discussed relaxation techniques: she likes to watch TV to relax. She likes to listen to music Discussed mood of client. She gets sad occasionally in managing medical needs.  She said she would like to do activities she once did in home such as cleaning , cooking, etc; but she has reduced energoy Talked of support from PCP, Dr. Christel Mormon.  She has appointment with PCP in next 2 weeks Discussed appetite. She said she  has reduced appetite Client has loved to cook in the past.  Thanked client for phone call with LCSW. Encouraged client to call LCSW as needed at [phone].          SDOH assessments and interventions completed:  Yes  SDOH Interventions Today    Flowsheet Row Most Recent Value  SDOH Interventions   Depression Interventions/Treatment  Counseling  Physical Activity Interventions Other (Comments)  [gets tired in walking. Short of breath on occasion]  Stress Interventions Provide Counseling  [has some stress in managing medical needs. concerned about decreased energy]        Care Coordination Interventions:  Yes, provided   Interventions Today    Flowsheet Row Most Recent Value  Chronic Disease   Chronic disease during today's visit Other  [spoke with client about her health needs]  General Interventions   General Interventions Discussed/Reviewed General Interventions Discussed, [employer]  [discussed program support]  Education Interventions   Education Provided Provided Education  Provided Engineer, [employer] On [employer]  Mental Health Interventions   Mental Health Discussed/Reviewed Coping Strategies  [has stress in managing medical needs. Concerned about heart issues. Says she could be in A Fib on occasion and may not realize it]  Nutrition Interventions   Nutrition Discussed/Reviewed Nutrition Discussed  Pharmacy Interventions   Pharmacy Dicussed/Reviewed Pharmacy Topics Discussed  Safety Interventions   Safety Discussed/Reviewed Fall Risk        Follow up plan: Follow up call scheduled for  07/08/23 at 10:00 AM    Encounter Outcome:  Patient Visit Completed   Kelton Pillar.Latesa Fratto MSW, LCSW Licensed Visual merchandiser [facility] Care Management [phone]

## 2023-05-20 NOTE — Patient Instructions (Signed)
Visit Information  Thank you for taking time to visit with me today. Please don't hesitate to contact me if I can be of assistance to you.   Following are the goals we discussed today:   Goals Addressed             This Visit's Progress    patient is at risk for falls. Patient is concerned over managing health needs       Interventions:  Discussed client needs with client via phone today . LCSW spoke with client about her risk for falls. Client said she had not had a fall in one year. Client is concerned over managing her health needs Client said she is fatigued regularly. She gets short of breath sometimes when walking. She uses inhaler as prescribed. She has nebulizer to use as needed Spoke with client about sleeping issues. She has difficulty sleeping. She sleeps in a recliner Discussed family support. Daughter, Tonie Griffith , is supportive of client. Daughter transports client as needed. Daughter helps client obtain needed grocery items Reviewed program support for client with RN, LCSW and Pharmacist Reviewed medication procurement. Client has prescribed medications  Discussed back pain issues. She has ongoing back pain issues during the day Provided counseling support for client Discussed walking of client She has some difficulty walking. She is not using a device to help her walk. Discussed relaxation techniques: she likes to watch TV to relax. She likes to listen to music Discussed mood of client. She gets sad occasionally in managing medical needs.  She said she would like to do activities she once did in home such as cleaning , cooking, etc; but she has reduced energoy Talked of support from PCP, Dr. Christel Mormon.  She has appointment with PCP in next 2 weeks Discussed appetite. She said she has reduced appetite Client has loved to cook in the past.  Thanked client for phone call with LCSW. Encouraged client to call LCSW as needed at [phone].          Our next  appointment is by telephone on 07/08/23 at 10:00 AM   Please call the care guide team at [phone] if you need to cancel or reschedule your appointment.   If you are experiencing a Mental Health or Behavioral Health Crisis or need someone to talk to, please go to [facility] [address] [place] [phone])   The patient verbalized understanding of instructions, educational materials, and care plan provided today and DECLINED offer to receive copy of patient instructions, educational materials, and care plan.   The patient has been provided with contact information for the care management team and has been advised to call with any health related questions or concerns.   Kelton Pillar.Nirel Babler MSW, LCSW Licensed Visual merchandiser [facility] Care Management [phone]

## 2023-05-22 ENCOUNTER — Other Ambulatory Visit: Payer: Self-pay | Admitting: Internal Medicine

## 2023-05-22 ENCOUNTER — Other Ambulatory Visit (HOSPITAL_COMMUNITY): Payer: Self-pay

## 2023-05-23 ENCOUNTER — Other Ambulatory Visit: Payer: Self-pay

## 2023-05-24 ENCOUNTER — Other Ambulatory Visit (HOSPITAL_COMMUNITY): Payer: Self-pay

## 2023-05-24 MED ORDER — PANTOPRAZOLE SODIUM 40 MG PO TBEC
40.0000 mg | DELAYED_RELEASE_TABLET | Freq: Every day | ORAL | 2 refills | Status: DC
  Filled 2023-05-24: qty 30, 30d supply, fill #0
  Filled 2023-06-28: qty 30, 30d supply, fill #1
  Filled 2023-07-27: qty 30, 30d supply, fill #2

## 2023-06-03 ENCOUNTER — Ambulatory Visit: Payer: Medicare HMO | Attending: Nurse Practitioner | Admitting: Nurse Practitioner

## 2023-06-03 ENCOUNTER — Encounter: Payer: Self-pay | Admitting: Nurse Practitioner

## 2023-06-03 VITALS — BP 110/60 | HR 97 | Ht 66.0 in | Wt 128.8 lb

## 2023-06-03 DIAGNOSIS — R5383 Other fatigue: Secondary | ICD-10-CM | POA: Diagnosis not present

## 2023-06-03 DIAGNOSIS — I6523 Occlusion and stenosis of bilateral carotid arteries: Secondary | ICD-10-CM

## 2023-06-03 DIAGNOSIS — I771 Stricture of artery: Secondary | ICD-10-CM | POA: Diagnosis not present

## 2023-06-03 DIAGNOSIS — I739 Peripheral vascular disease, unspecified: Secondary | ICD-10-CM

## 2023-06-03 DIAGNOSIS — I4891 Unspecified atrial fibrillation: Secondary | ICD-10-CM

## 2023-06-03 DIAGNOSIS — R519 Headache, unspecified: Secondary | ICD-10-CM

## 2023-06-03 DIAGNOSIS — J449 Chronic obstructive pulmonary disease, unspecified: Secondary | ICD-10-CM

## 2023-06-03 DIAGNOSIS — Z9181 History of falling: Secondary | ICD-10-CM | POA: Diagnosis not present

## 2023-06-03 DIAGNOSIS — E785 Hyperlipidemia, unspecified: Secondary | ICD-10-CM | POA: Diagnosis not present

## 2023-06-03 NOTE — Patient Instructions (Addendum)
Medication Instructions:  Your physician recommends that you continue on your current medications as directed. Please refer to the Current Medication list given to you today.  Labwork: None  Testing/Procedures: None  Follow-Up: Your physician recommends that you schedule a follow-up appointment in: 4 Weeks   Any Other Special Instructions Will Be Listed Below (If Applicable).  If you need a refill on your cardiac medications before your next appointment, please call your pharmacy.

## 2023-06-03 NOTE — Progress Notes (Signed)
Cardiology Office Note:  .   Date:  06/03/2023  ID:  Carrie Case, DOB April 10, 1956, MRN [id number] PCP: Billie Lade, MD  [facility] Providers Cardiologist:  Dina Rich, MD PV Cardiologist:  [name], MD    History of Present Illness: .   Carrie Case is a 67 y.o. female with a PMH of A-fib, PAD, HLD, carotid artery stenosis, tobacco use, and COPD, who presents today for scheduled follow-up.   I last saw this patient on February 25, 2023.  She was overall doing well at the time.  Today she presents for follow-up. Patient has main chief concern/complaint is fatigue, and says she has lost her get up and go.  Says "I do not like being a couch potato.  I sit on the couch 24/7."  Says she has missed maybe 1 dose of Xarelto in the past 6 weeks.  Also admits to constant headaches, and intermittent bleeding noted in her ear canals.  Denies any migraine symptoms/red flag symptoms.  Says she has also been evaluated by PCP regarding bleeding in her ear canals, says she was told that this may be swimmer's ear, but says she knows this is not swimmer's ear.  Denies any acute vision changes or strokelike symptoms. Denies any chest pain, shortness of breath, palpitations, syncope, presyncope, dizziness, orthopnea, PND, swelling or significant weight changes, acute bleeding, or claudication.  She states she has had 1 fall in the past 6 months, denied any syncope related to event.  Stated "I just fell."  Says this happens sometimes, not as often as it used to.  Says this is due to possibly feeling off balance.  Admits to insomnia, has been on multiple medications that do not seem to work.  Admits that she takes a friend's Xanax prescription that helps her poor sleep-says PCP is also aware of this.  ROS: Negative. Seen HPI.   Studies Reviewed: .     Echo 10/2022:  1. Left ventricular ejection fraction, by estimation, is 55 to 60%. The  left ventricle has normal function. The left  ventricle has no regional  wall motion abnormalities. There is mild concentric left ventricular  hypertrophy. Left ventricular diastolic  parameters are indeterminate. The average left ventricular global  longitudinal strain is -19.6 %. The global longitudinal strain is normal.   2. Right ventricular systolic function is low normal. The right  ventricular size is normal. There is normal pulmonary artery systolic  pressure. The estimated right ventricular systolic pressure is 35.7 mmHg.   3. Left atrial size was severely dilated.   4. Right atrial size was moderately dilated.   5. The mitral valve is degenerative. Mild mitral valve regurgitation.  Moderate mitral annular calcification.   6. The aortic valve is tricuspid. There is mild calcification of the  aortic valve. Aortic valve regurgitation is mild. Aortic valve sclerosis  is present, with no evidence of aortic valve stenosis. Aortic  regurgitation PHT measures 560 msec.   7. The inferior vena cava is normal in size with greater than 50%  respiratory variability, suggesting right atrial pressure of 3 mmHg.   Comparison(s): Prior images reviewed side by side. LVEF remains normal  range at 55-60%.   Risk Assessment/Calculations:    CHA2DS2-VASc Score = 3  This indicates a 3.2% annual risk of stroke. The patient's score is based upon: CHF History: 0 HTN History: 0 Diabetes History: 0 Stroke History: 0 Vascular Disease History: 1 Age Score: 1 Gender Score: 1  Physical Exam:   VS:  BP 110/60   Pulse 97   Ht 5\' 6"  (1.676 m)   Wt 128 lb 12.8 oz (58.4 kg)   SpO2 94%   BMI 20.79 kg/m    Wt Readings from Last 3 Encounters:  06/03/23 128 lb 12.8 oz (58.4 kg)  04/08/23 130 lb (59 kg)  03/25/23 135 lb (61.2 kg)    GEN: Well nourished, well developed in no acute distress NECK: No JVD; No carotid bruits CARDIAC: S1/S2, irregularly irregular rhythm, no murmurs, rubs, gallops RESPIRATORY:  Clear to auscultation without rales,  wheezing or rhonchi  ABDOMEN: Soft, non-tender, non-distended EXTREMITIES:  No edema; No deformity   ASSESSMENT AND PLAN: .    A-fib, fatigue Denies any palpitations or tachycardia.  Does notice fatigue, which I believe is due to her A-fib.  She has missed maybe 1 dose of Xarelto within the past 6 weeks, unsure when this was.  Discussed medication compliance, and patient verbalized understanding.  Continue Toprol-XL, diltiazem, and Xarelto 20 mg daily. Recommend to f/u with PCP regarding ear canal issue (see HPI), not noted on exam. At next OV, will discuss DCCV - will discuss further with Dr. Wyline Mood. Heart healthy diet and regular cardiovascular exercise encouraged. Recommend obtaining thyroid panel at next office visit with PCP.  PAD, HLD, carotid artery stenosis Denies any  symptoms.  Asymptomatic left subclavian stenosis, bilateral 1 to 39% ICA stenosis.  LDL 80 01/2022.  Continue rosuvastatin.  Managed by PCP.  Continue to follow-up with Dr. Kirke Corin.   COPD Denies any recent symptoms.  Continue current medication regimen.  Continue follow-up with Dr. Sherene Sires.   4. Headache Etiology unknown.  Patient denies that this is migraine related.  Recommended to keep a food diary and log what foods she is eating and presents this to her PCP next month.  She says Excedrin seems to help headache somewhat.  May benefit from seeing a neurologist.  5. Personal history of fall Patient states she has had 1 fall in the last 6 months.  Denies any head injury or acute injuries.  This is improved per her report.  Recommended to discuss this with PCP, may benefit from physical therapy.  Dispo: Follow-up with me or APP in 4-6 weeks or sooner if anything changes.  Signed, Sharlene Dory, NP

## 2023-06-08 ENCOUNTER — Other Ambulatory Visit: Payer: Self-pay

## 2023-06-12 ENCOUNTER — Telehealth: Payer: Self-pay | Admitting: Internal Medicine

## 2023-06-12 NOTE — Telephone Encounter (Signed)
Patient called in complaining of severe headaches.  Wants a call back with OTC recommendations

## 2023-06-12 NOTE — Telephone Encounter (Signed)
Spoke to patient

## 2023-06-17 ENCOUNTER — Other Ambulatory Visit: Payer: Self-pay

## 2023-06-18 ENCOUNTER — Other Ambulatory Visit: Payer: Self-pay

## 2023-06-21 ENCOUNTER — Encounter (HOSPITAL_COMMUNITY): Payer: Self-pay

## 2023-06-21 DIAGNOSIS — R0902 Hypoxemia: Secondary | ICD-10-CM | POA: Diagnosis not present

## 2023-06-21 DIAGNOSIS — F17210 Nicotine dependence, cigarettes, uncomplicated: Secondary | ICD-10-CM | POA: Diagnosis not present

## 2023-06-21 DIAGNOSIS — G9389 Other specified disorders of brain: Secondary | ICD-10-CM | POA: Diagnosis not present

## 2023-06-21 DIAGNOSIS — E871 Hypo-osmolality and hyponatremia: Secondary | ICD-10-CM | POA: Diagnosis not present

## 2023-06-21 DIAGNOSIS — R0602 Shortness of breath: Secondary | ICD-10-CM | POA: Diagnosis not present

## 2023-06-21 DIAGNOSIS — I509 Heart failure, unspecified: Secondary | ICD-10-CM | POA: Diagnosis not present

## 2023-06-21 DIAGNOSIS — R000 Tachycardia, unspecified: Secondary | ICD-10-CM | POA: Diagnosis not present

## 2023-06-21 DIAGNOSIS — I6782 Cerebral ischemia: Secondary | ICD-10-CM | POA: Diagnosis not present

## 2023-06-21 DIAGNOSIS — I499 Cardiac arrhythmia, unspecified: Secondary | ICD-10-CM | POA: Diagnosis not present

## 2023-06-21 DIAGNOSIS — A419 Sepsis, unspecified organism: Secondary | ICD-10-CM | POA: Diagnosis not present

## 2023-06-21 DIAGNOSIS — N3001 Acute cystitis with hematuria: Secondary | ICD-10-CM | POA: Diagnosis not present

## 2023-06-21 DIAGNOSIS — J189 Pneumonia, unspecified organism: Secondary | ICD-10-CM | POA: Diagnosis not present

## 2023-06-21 DIAGNOSIS — I4891 Unspecified atrial fibrillation: Secondary | ICD-10-CM | POA: Diagnosis not present

## 2023-06-21 DIAGNOSIS — J449 Chronic obstructive pulmonary disease, unspecified: Secondary | ICD-10-CM | POA: Diagnosis not present

## 2023-06-21 DIAGNOSIS — J9811 Atelectasis: Secondary | ICD-10-CM | POA: Diagnosis not present

## 2023-06-21 DIAGNOSIS — E876 Hypokalemia: Secondary | ICD-10-CM | POA: Diagnosis not present

## 2023-06-21 DIAGNOSIS — I700 Atherosclerosis of aorta: Secondary | ICD-10-CM | POA: Diagnosis not present

## 2023-06-21 DIAGNOSIS — R918 Other nonspecific abnormal finding of lung field: Secondary | ICD-10-CM | POA: Diagnosis not present

## 2023-06-21 DIAGNOSIS — R6521 Severe sepsis with septic shock: Secondary | ICD-10-CM | POA: Diagnosis not present

## 2023-06-22 ENCOUNTER — Other Ambulatory Visit: Payer: Self-pay

## 2023-06-22 ENCOUNTER — Inpatient Hospital Stay (HOSPITAL_COMMUNITY)
Admit: 2023-06-22 | Discharge: 2023-06-27 | DRG: 291 | Disposition: A | Discharge status: Home Health | Payer: Medicare HMO | Attending: Internal Medicine | Admitting: Internal Medicine

## 2023-06-22 ENCOUNTER — Inpatient Hospital Stay (HOSPITAL_COMMUNITY): Payer: Medicare HMO

## 2023-06-22 ENCOUNTER — Encounter (HOSPITAL_COMMUNITY): Payer: Self-pay | Admitting: Student

## 2023-06-22 DIAGNOSIS — Z66 Do not resuscitate: Secondary | ICD-10-CM | POA: Diagnosis present

## 2023-06-22 DIAGNOSIS — I351 Nonrheumatic aortic (valve) insufficiency: Secondary | ICD-10-CM | POA: Diagnosis present

## 2023-06-22 DIAGNOSIS — R54 Age-related physical debility: Secondary | ICD-10-CM | POA: Diagnosis present

## 2023-06-22 DIAGNOSIS — I739 Peripheral vascular disease, unspecified: Secondary | ICD-10-CM | POA: Diagnosis not present

## 2023-06-22 DIAGNOSIS — Z681 Body mass index (BMI) 19 or less, adult: Secondary | ICD-10-CM | POA: Diagnosis not present

## 2023-06-22 DIAGNOSIS — N390 Urinary tract infection, site not specified: Secondary | ICD-10-CM | POA: Diagnosis present

## 2023-06-22 DIAGNOSIS — I4892 Unspecified atrial flutter: Secondary | ICD-10-CM | POA: Diagnosis not present

## 2023-06-22 DIAGNOSIS — I509 Heart failure, unspecified: Secondary | ICD-10-CM | POA: Diagnosis not present

## 2023-06-22 DIAGNOSIS — E782 Mixed hyperlipidemia: Secondary | ICD-10-CM | POA: Diagnosis not present

## 2023-06-22 DIAGNOSIS — I5033 Acute on chronic diastolic (congestive) heart failure: Principal | ICD-10-CM | POA: Insufficient documentation

## 2023-06-22 DIAGNOSIS — E440 Moderate protein-calorie malnutrition: Secondary | ICD-10-CM

## 2023-06-22 DIAGNOSIS — Z8249 Family history of ischemic heart disease and other diseases of the circulatory system: Secondary | ICD-10-CM | POA: Diagnosis not present

## 2023-06-22 DIAGNOSIS — I480 Paroxysmal atrial fibrillation: Secondary | ICD-10-CM | POA: Diagnosis present

## 2023-06-22 DIAGNOSIS — Z825 Family history of asthma and other chronic lower respiratory diseases: Secondary | ICD-10-CM | POA: Diagnosis not present

## 2023-06-22 DIAGNOSIS — N3001 Acute cystitis with hematuria: Secondary | ICD-10-CM | POA: Diagnosis not present

## 2023-06-22 DIAGNOSIS — E871 Hypo-osmolality and hyponatremia: Secondary | ICD-10-CM | POA: Diagnosis present

## 2023-06-22 DIAGNOSIS — J440 Chronic obstructive pulmonary disease with acute lower respiratory infection: Secondary | ICD-10-CM | POA: Diagnosis not present

## 2023-06-22 DIAGNOSIS — Z79899 Other long term (current) drug therapy: Secondary | ICD-10-CM

## 2023-06-22 DIAGNOSIS — Z886 Allergy status to analgesic agent status: Secondary | ICD-10-CM

## 2023-06-22 DIAGNOSIS — E785 Hyperlipidemia, unspecified: Secondary | ICD-10-CM | POA: Diagnosis present

## 2023-06-22 DIAGNOSIS — E876 Hypokalemia: Secondary | ICD-10-CM | POA: Diagnosis not present

## 2023-06-22 DIAGNOSIS — R001 Bradycardia, unspecified: Secondary | ICD-10-CM | POA: Diagnosis not present

## 2023-06-22 DIAGNOSIS — R918 Other nonspecific abnormal finding of lung field: Secondary | ICD-10-CM | POA: Diagnosis not present

## 2023-06-22 DIAGNOSIS — A419 Sepsis, unspecified organism: Secondary | ICD-10-CM | POA: Diagnosis not present

## 2023-06-22 DIAGNOSIS — F17210 Nicotine dependence, cigarettes, uncomplicated: Secondary | ICD-10-CM | POA: Diagnosis not present

## 2023-06-22 DIAGNOSIS — Z83438 Family history of other disorder of lipoprotein metabolism and other lipidemia: Secondary | ICD-10-CM | POA: Diagnosis not present

## 2023-06-22 DIAGNOSIS — Z7901 Long term (current) use of anticoagulants: Secondary | ICD-10-CM | POA: Diagnosis not present

## 2023-06-22 DIAGNOSIS — I4891 Unspecified atrial fibrillation: Principal | ICD-10-CM | POA: Diagnosis present

## 2023-06-22 DIAGNOSIS — J449 Chronic obstructive pulmonary disease, unspecified: Secondary | ICD-10-CM | POA: Diagnosis present

## 2023-06-22 DIAGNOSIS — J9601 Acute respiratory failure with hypoxia: Secondary | ICD-10-CM | POA: Diagnosis present

## 2023-06-22 DIAGNOSIS — I700 Atherosclerosis of aorta: Secondary | ICD-10-CM | POA: Diagnosis not present

## 2023-06-22 DIAGNOSIS — R6521 Severe sepsis with septic shock: Secondary | ICD-10-CM | POA: Diagnosis not present

## 2023-06-22 DIAGNOSIS — J189 Pneumonia, unspecified organism: Secondary | ICD-10-CM | POA: Diagnosis present

## 2023-06-22 DIAGNOSIS — I4811 Longstanding persistent atrial fibrillation: Secondary | ICD-10-CM | POA: Diagnosis present

## 2023-06-22 DIAGNOSIS — K219 Gastro-esophageal reflux disease without esophagitis: Secondary | ICD-10-CM | POA: Diagnosis present

## 2023-06-22 DIAGNOSIS — R0602 Shortness of breath: Secondary | ICD-10-CM | POA: Diagnosis not present

## 2023-06-22 HISTORY — DX: Age-related osteoporosis without current pathological fracture: M81.0

## 2023-06-22 HISTORY — DX: Unspecified atrial fibrillation: I48.91

## 2023-06-22 HISTORY — DX: Hyperlipidemia, unspecified: E78.5

## 2023-06-22 HISTORY — DX: Other amnesia: R41.3

## 2023-06-22 HISTORY — DX: Repeated falls: R29.6

## 2023-06-22 HISTORY — DX: Dependence on supplemental oxygen: Z99.81

## 2023-06-22 HISTORY — DX: Heart failure, unspecified: I50.9

## 2023-06-22 HISTORY — DX: Occlusion and stenosis of unspecified carotid artery: I65.29

## 2023-06-22 HISTORY — DX: Peripheral vascular disease, unspecified: I73.9

## 2023-06-22 HISTORY — DX: Chronic obstructive pulmonary disease, unspecified: J44.9

## 2023-06-22 LAB — CBC WITH DIFFERENTIAL/PLATELET
Abs Immature Granulocytes: 0.11 10*3/uL — ABNORMAL HIGH (ref 0.00–0.07)
Basophils Absolute: 0 10*3/uL (ref 0.0–0.1)
Basophils Relative: 0 %
Eosinophils Absolute: 0 10*3/uL (ref 0.0–0.5)
Eosinophils Relative: 0 %
HCT: 39.7 % (ref 36.0–46.0)
Hemoglobin: 13.7 g/dL (ref 12.0–15.0)
Immature Granulocytes: 1 %
Lymphocytes Relative: 8 %
Lymphs Abs: 1.2 10*3/uL (ref 0.7–4.0)
MCH: 33 pg (ref 26.0–34.0)
MCHC: 34.5 g/dL (ref 30.0–36.0)
MCV: 95.7 fL (ref 80.0–100.0)
Monocytes Absolute: 1.3 10*3/uL — ABNORMAL HIGH (ref 0.1–1.0)
Monocytes Relative: 9 %
Neutro Abs: 12 10*3/uL — ABNORMAL HIGH (ref 1.7–7.7)
Neutrophils Relative %: 82 %
Platelets: 354 10*3/uL (ref 150–400)
RBC: 4.15 MIL/uL (ref 3.87–5.11)
RDW: 14.4 % (ref 11.5–15.5)
WBC: 14.7 10*3/uL — ABNORMAL HIGH (ref 4.0–10.5)
nRBC: 0 % (ref 0.0–0.2)

## 2023-06-22 LAB — COMPREHENSIVE METABOLIC PANEL
ALT: 52 U/L — ABNORMAL HIGH (ref 0–44)
AST: 109 U/L — ABNORMAL HIGH (ref 15–41)
Albumin: 2.2 g/dL — ABNORMAL LOW (ref 3.5–5.0)
Alkaline Phosphatase: 122 U/L (ref 38–126)
Anion gap: 10 (ref 5–15)
BUN: 18 mg/dL (ref 8–23)
CO2: 22 mmol/L (ref 22–32)
Calcium: 10.1 mg/dL (ref 8.9–10.3)
Chloride: 104 mmol/L (ref 98–111)
Creatinine, Ser: 1.09 mg/dL — ABNORMAL HIGH (ref 0.44–1.00)
GFR, Estimated: 56 mL/min — ABNORMAL LOW (ref >60)
Glucose, Bld: 112 mg/dL — ABNORMAL HIGH (ref 70–99)
Potassium: 3.7 mmol/L (ref 3.5–5.1)
Sodium: 136 mmol/L (ref 135–145)
Total Bilirubin: 0.6 mg/dL (ref 0.3–1.2)
Total Protein: 6.9 g/dL (ref 6.5–8.1)

## 2023-06-22 LAB — MRSA NEXT GEN BY PCR, NASAL: MRSA by PCR Next Gen: DETECTED — AB

## 2023-06-22 LAB — LIPID PANEL
Cholesterol: 59 mg/dL (ref 0–200)
HDL: 20 mg/dL — ABNORMAL LOW (ref >40)
LDL Cholesterol: 24 mg/dL (ref 0–99)
Total CHOL/HDL Ratio: 3 {ratio}
Triglycerides: 75 mg/dL (ref <150)
VLDL: 15 mg/dL (ref 0–40)

## 2023-06-22 LAB — MAGNESIUM: Magnesium: 2.3 mg/dL (ref 1.7–2.4)

## 2023-06-22 LAB — PHOSPHORUS: Phosphorus: 2.7 mg/dL (ref 2.5–4.6)

## 2023-06-22 LAB — TSH: TSH: 0.905 u[IU]/mL (ref 0.350–4.500)

## 2023-06-22 LAB — BRAIN NATRIURETIC PEPTIDE: B Natriuretic Peptide: 746.2 pg/mL — ABNORMAL HIGH (ref 0.0–100.0)

## 2023-06-22 LAB — HIV ANTIBODY (ROUTINE TESTING W REFLEX): HIV Screen 4th Generation wRfx: NONREACTIVE

## 2023-06-22 MED ORDER — AMIODARONE HCL IN DEXTROSE 360-4.14 MG/200ML-% IV SOLN
30.0000 mg/h | INTRAVENOUS | Status: DC
  Administered 2023-06-22 – 2023-06-26 (×10): 30 mg/h via INTRAVENOUS
  Filled 2023-06-22 (×10): qty 200

## 2023-06-22 MED ORDER — ACETAMINOPHEN 325 MG PO TABS
650.0000 mg | ORAL_TABLET | Freq: Four times a day (QID) | ORAL | Status: DC | PRN
  Administered 2023-06-23: 650 mg via ORAL
  Filled 2023-06-22: qty 2

## 2023-06-22 MED ORDER — SENNOSIDES-DOCUSATE SODIUM 8.6-50 MG PO TABS: 1.0000 | ORAL_TABLET | Freq: Every evening | ORAL | Status: DC | PRN

## 2023-06-22 MED ORDER — UMECLIDINIUM BROMIDE 62.5 MCG/ACT IN AEPB
1.0000 | INHALATION_SPRAY | Freq: Every day | RESPIRATORY_TRACT | Status: DC
  Administered 2023-06-23 – 2023-06-24 (×2): 1 via RESPIRATORY_TRACT
  Filled 2023-06-22: qty 7

## 2023-06-22 MED ORDER — ONDANSETRON HCL 4 MG/2ML IJ SOLN: 4.0000 mg | Freq: Four times a day (QID) | INTRAMUSCULAR | Status: DC | PRN

## 2023-06-22 MED ORDER — ONDANSETRON HCL 4 MG PO TABS: 4.0000 mg | ORAL_TABLET | Freq: Four times a day (QID) | ORAL | Status: DC | PRN

## 2023-06-22 MED ORDER — ARFORMOTEROL TARTRATE 15 MCG/2ML IN NEBU
15.0000 ug | INHALATION_SOLUTION | Freq: Two times a day (BID) | RESPIRATORY_TRACT | Status: DC
  Administered 2023-06-22 – 2023-06-27 (×10): 15 ug via RESPIRATORY_TRACT
  Filled 2023-06-22 (×10): qty 2

## 2023-06-22 MED ORDER — PANTOPRAZOLE SODIUM 40 MG PO TBEC
40.0000 mg | DELAYED_RELEASE_TABLET | Freq: Every day | ORAL | Status: DC
  Administered 2023-06-22 – 2023-06-27 (×6): 40 mg via ORAL
  Filled 2023-06-22 (×7): qty 1

## 2023-06-22 MED ORDER — ROSUVASTATIN CALCIUM 20 MG PO TABS
20.0000 mg | ORAL_TABLET | Freq: Every day | ORAL | Status: DC
  Administered 2023-06-22 – 2023-06-27 (×6): 20 mg via ORAL
  Filled 2023-06-22 (×6): qty 1

## 2023-06-22 MED ORDER — IPRATROPIUM-ALBUTEROL 0.5-2.5 (3) MG/3ML IN SOLN: 3.0000 mL | Freq: Four times a day (QID) | RESPIRATORY_TRACT | Status: DC | PRN

## 2023-06-22 MED ORDER — RIVAROXABAN 20 MG PO TABS
20.0000 mg | ORAL_TABLET | Freq: Every day | ORAL | Status: DC
  Administered 2023-06-22: 20 mg via ORAL
  Filled 2023-06-22 (×2): qty 1

## 2023-06-22 MED ORDER — FUROSEMIDE 10 MG/ML IJ SOLN
40.0000 mg | Freq: Once | INTRAMUSCULAR | Status: AC
  Administered 2023-06-22: 40 mg via INTRAVENOUS
  Filled 2023-06-22: qty 4

## 2023-06-22 MED ORDER — AMIODARONE LOAD VIA INFUSION
150.0000 mg | Freq: Once | INTRAVENOUS | Status: AC
  Administered 2023-06-22: 150 mg via INTRAVENOUS
  Filled 2023-06-22: qty 83.34

## 2023-06-22 MED ORDER — POTASSIUM CHLORIDE CRYS ER 20 MEQ PO TBCR
40.0000 meq | EXTENDED_RELEASE_TABLET | Freq: Once | ORAL | Status: AC
  Administered 2023-06-22: 40 meq via ORAL
  Filled 2023-06-22: qty 2

## 2023-06-22 MED ORDER — ACETAMINOPHEN 650 MG RE SUPP: 650.0000 mg | Freq: Four times a day (QID) | RECTAL | Status: DC | PRN

## 2023-06-22 NOTE — H&P (Signed)
History and Physical    Patient: Carrie Case ZOX:[id number] DOB: 1956-02-12 DOA: 06/22/2023 DOS: the patient was seen and examined on 06/22/2023 PCP: Billie Lade, MD  Patient coming from: [facility]  Chief Complaint: Shortness of breath  HPI: Carrie Case is a 67 y.o. female with medical history significant of A-fib on Xarelto, PAD, HLD, carotid artery stenosis, tobacco use, and COPD, who presents as a transfer from [facility] for further evaluation of shortness of breath.  Patient reports that she started having shortness of breath and coughing 2 days ago. En route to [facility] ED, her heart rate was found to be in the 200s.  She was evaluated in the ED and found to have fluid on her lungs, low potassium, tachycardia and a UTI.  States did give her some antibiotics and attempted to replete her potassium.  She was transferred here due to inability to bring her heart rate down.  During my evaluation, patient comfortably on 2 L [place]. She denies any chest pain, headaches, dizziness, abdominal pain, dysuria, leg swelling or palpitations.  [place] ED data:  Pro BNP 8570 INR 4.69 Mg 2.3 K+ 2.7 BUN 19 Cr 1.47 Na 129 WBC 17 Hgb 14.3 Plt 372  lactic acid 1.9 ABG 7.46/39/45/27.6 UA +WBC, +bacteria,  CXR likely edema, Vital Signs: BP 105/82  Pulse 137  Temp 36.9 C (98.4 F) (Oral)  Resp 25  CXR showed interstitial edema.  CT head negative for acute intracranial abnormality.   Review of Systems: As mentioned in the history of present illness. All other systems reviewed and are negative. Past Medical History:  Diagnosis Date   Acute respiratory failure with hypoxia (HCC) 12/22/2018   Tobacco dependence    Past Surgical History:  Procedure Laterality Date   TUBAL LIGATION     Social History:  reports that she has been smoking cigarettes. She has a 23.5 pack-year smoking history. She has never been exposed to tobacco smoke. She has never used smokeless tobacco. She reports  that she does not drink alcohol and does not use drugs.  Allergies  Allergen Reactions   Ibuprofen Hives    Family History  Problem Relation Age of Onset   Hypertension Mother    Hyperlipidemia Mother    Cancer Mother    COPD Mother    Anxiety disorder Mother     Prior to Admission medications   Medication Sig Start Date End Date Taking? Authorizing Provider  albuterol (PROVENTIL) (2.5 MG/3ML) 0.083% nebulizer solution Take 3 mLs (2.5 mg total) by nebulization every 6 (six) hours as needed for wheezing or shortness of breath. 01/23/23   Nyoka Cowden, MD  diltiazem (CARTIA XT) 300 MG 24 hr capsule Take 1 capsule (300 mg total) by mouth daily. 01/28/23   Billie Lade, MD  famotidine (PEPCID) 20 MG tablet Take 1 tablet (20 mg total) by mouth daily after supper. 03/04/23   Nyoka Cowden, MD  hydrOXYzine (VISTARIL) 25 MG capsule Take 1 capsule (25 mg total) by mouth every 8 (eight) hours as needed for anxiety (insmonia). 03/25/23   Billie Lade, MD  metoprolol succinate (TOPROL XL) 25 MG 24 hr tablet Take 1 tablet (25 mg total) by mouth daily. 02/25/23   Sharlene Dory, NP  pantoprazole (PROTONIX) 40 MG tablet Take 1 tablet (40 mg total) by mouth daily. Take 30-60 min before first meal of the day 05/24/23   Nyoka Cowden, MD  rivaroxaban (XARELTO) 20 MG TABS tablet Take 1 tablet (  20 mg total) by mouth daily with supper. 05/08/23   Antoine Poche, MD  rosuvastatin (CRESTOR) 20 MG tablet Take 1 tablet (20 mg total) by mouth daily. 01/29/23   Billie Lade, MD  Tiotropium Bromide-Olodaterol (STIOLTO RESPIMAT) 2.5-2.5 MCG/ACT AERS Inhale 2 puffs into the lungs in the morning. 05/13/23   Nyoka Cowden, MD    Physical Exam: Vitals:   06/22/23 1000 06/22/23 1155 06/22/23 1300 06/22/23 1644  BP: 92/72 98/86  92/67  Pulse: (!) 147 (!) 125    Resp: 19 (!) 25  (!) 25  Temp:  97.6 F (36.4 C)  97.8 F (36.6 C)  TempSrc:  Oral  Oral  SpO2: 94% 96%  96%  Weight:   58.4 kg    General: Pleasant, well-appearing elderly woman laying in bed. No acute distress. Neck: JVD to the mid neck. CV: Tachycardic. Irregularly irregular rhythm. No murmurs, rubs, or gallops. No LE edema Pulmonary: Lungs CTAB. Normal effort. Rales throughout. No wheezing. Abdominal: Soft, nontender, nondistended. Normal bowel sounds. Extremities: Palpable radial pulse. 1+ pedal pulses.  Normal ROM. Skin: Warm and dry. No obvious rash or lesions. Neuro: A&Ox3. Moves all extremities. Normal sensation. No focal deficit. Psych: Normal mood and affect  Data Reviewed:  BNP 746, TSH 0.905, mag 2.3, Phos 2.7, CBC with mild white count of 14.7 and normal hemoglobin at 13.7. CMP shows K+ 3.7, creatinine of 1.09. Positive MRSA screen EKG shows A-fib with RVR at a rate of 138  Assessment and Plan: Carrie Case is a 67 y.o. female with medical history significant of A-fib on Xarelto, PAD, HLD, carotid artery stenosis, GERD, tobacco use, and COPD, who presents as a transfer from [facility] for further evaluation of shortness of breath and admitted for A-fib with RVR and heart failure exacerbation.  # A-fib with RVR Patient with history of persistent A-fib on Xarelto transferred from [facility] ED for further evaluation of A-fib with RVR. EKG on arrival shows A-fib with RVR with rates in the 130s.  BP remains soft in the 90s to 100s. She is s/p IV Dilt by EMS and route to [facility] ED as well as IV amiodarone in the ED without significant improvement in heart rate. Normal mag, Phos and TSH. HR persistently in the 130s to 150s. Patient denies any palpitations, chest pain or dizziness. -Cardiology consulted, appreciate recs -IV amiodarone bolus and drip -Xarelto 20 mg daily -Trend and replete electrolytes  # Heart failure exacerbation Patient with history of heart failure with preserved ejection fraction here for progressive shortness of breath and dyspnea on exertion. Last TTE 10/2022 showed EF  55-60%, mild LVH, severely dilated LA, and moderately dilated RA. Current presentation shows BNP of 700, pulmonary edema on CXR, JVD and rales on exam. Findings consistent with acute on chronic heart failure. Her BP remains low so we will provide gentle diuresing with close monitoring of vitals. -Cardiology consulted, appreciate recs -TTE to evaluate systolic function -IV Lasix 40 mg x 1 -Strict I&O's, daily weights  # Asymptomatic bacteriuria Patient found to have UA with positive WBC, positive bacteria at [facility] ED however patient does not have any urinary symptoms. White count slightly up today but patient afebrile.  Will hold off any further antibiotics. -Trend CBC, fever curve  # COPD Stiolto Respimat and albuterol nebs at home. No wheezing on exam. Comfortable on 2 L [place]. -Resume home inhaler with substitute -As needed DuoNebs  # HLD # PAD Lipid panel showed LDL at goal at  24 -Crestor 20 mg daily  # Tobacco use disorder States she is down to a few cigarettes a day. -Tobacco abuse counseling  # GERD -Protonix 40 mg daily   Advance Care Planning:   Code Status: Limited: Do not attempt resuscitation (DNR) -DNR-LIMITED -Do Not Intubate/DNI    Consults: Cardiology  Family Communication: Discussed admission with daughter on the phone  Severity of Illness: The appropriate patient status for this patient is INPATIENT. Inpatient status is judged to be reasonable and necessary in order to provide the required intensity of service to ensure the patient's safety. The patient's presenting symptoms, physical exam findings, and initial radiographic and laboratory data in the context of their chronic comorbidities is felt to place them at high risk for further clinical deterioration. Furthermore, it is not anticipated that the patient will be medically stable for discharge from the hospital within 2 midnights of admission.   * I certify that at the point of admission it is my clinical  judgment that the patient will require inpatient hospital care spanning beyond 2 midnights from the point of admission due to high intensity of service, high risk for further deterioration and high frequency of surveillance required.*  Author: Steffanie Rainwater, MD 06/22/2023 10:58 AM  For on call review [email or web address].

## 2023-06-22 NOTE — Consult Note (Signed)
Cardiology Consultation   Patient ID: Carrie Case MRN: [id number]; DOB: Dec 22, 1955  Admit date: 06/22/2023 Date of Consult: 06/22/2023  PCP:  Carrie Lade, MD   [facility] Providers Cardiologist:  Dina Rich, MD  PV Cardiologist:  [name], MD  {   Patient Profile:   Carrie Case is a 67 y.o. female with a hx of long standing persistent afib, COPD, PAD  who is being seen 06/22/2023 for the evaluation of afib with RVR at the request of Dr Kirke Corin.  History of Present Illness:   [name] of PAF, PAD, HLD, carotid stenosis, COPD, presented initially to [facility] with SOB and cough. SIgnificant tachcyardia during by EMS and on ER evaluation, found to be in afib with RVR. While at [facility] started on IV diltiazem, later transitioned to IV amiodarone. She was transferred to [facility] for further management of her afib with RVR.    [facility] Data Pro BNP 8570 INR 4.69 Mg 2.3 K 2.7 BUN 19 Cr 1.47 Na 129 WBC 17 Hgb 14.3 Plt 372 [place] 1.9 ABG 7.46/39/45/27.6 UA +WBC, +bacteria,  CXR likely edema,   [place] labs pending EKG afib 138   Past Medical History:  Diagnosis Date   Acute respiratory failure with hypoxia (HCC) 12/22/2018   Tobacco dependence     Past Surgical History:  Procedure Laterality Date   TUBAL LIGATION        Inpatient Medications: Scheduled Meds:  Continuous Infusions:  PRN Meds: acetaminophen **OR** acetaminophen, ondansetron **OR** ondansetron (ZOFRAN) IV, senna-docusate  Allergies:    Allergies  Allergen Reactions   Ibuprofen Hives    Social History:   Social History   Socioeconomic History   Marital status: Widowed    Spouse name: Not on file   Number of children: Not on file   Years of education: Not on file   Highest education level: Not on file  Occupational History   Occupation: unemployed  Tobacco Use   Smoking status: Every Day    Current  packs/day: 0.50    Average packs/day: 0.5 packs/day for 47.0 years (23.5 ttl pk-yrs)    Types: Cigarettes    Passive exposure: Never   Smokeless tobacco: Never   Tobacco comments:    Smoking about 1/2 ppd per patient on 11/01/2021  Vaping Use   Vaping status: Never Used  Substance and Sexual Activity   Alcohol use: No   Drug use: No   Sexual activity: Not Currently  Other Topics Concern   Not on file  Social History Narrative   Not on file   Social Determinants of Health   Financial Resource Strain: Low Risk  (11/15/2022)   Overall Financial Resource Strain (CARDIA)    Difficulty of Paying Living Expenses: Not very hard  Food Insecurity: No Food Insecurity (11/15/2022)   Hunger Vital Sign    Worried About Running Out of Food in the Last Year: Never true    Ran Out of Food in the Last Year: Never true  Transportation Needs: No Transportation Needs (12/29/2021)   PRAPARE - Administrator, Civil Service (Medical): No    Lack of Transportation (Non-Medical): No  Physical Activity: Inactive (05/20/2023)   Exercise Vital Sign    Days of Exercise per Week: 0 days    Minutes of Exercise per Session: 0 min  Stress: Stress Concern Present (05/20/2023)   [employer] of Occupational Health - Occupational Stress Questionnaire  Feeling of Stress : Rather much  Social Connections: Socially Isolated (11/15/2022)   Social Connection and Isolation Panel [NHANES]    Frequency of Communication with Friends and Family: More than three times a week    Frequency of Social Gatherings with Friends and Family: Once a week    Attends Religious Services: Never    Database administrator or Organizations: No    Attends Banker Meetings: Never    Marital Status: Widowed  Intimate Partner Violence: Not At Risk (11/15/2022)   Humiliation, Afraid, Rape, and Kick questionnaire    Fear of Current or Ex-Partner: No    Emotionally Abused: No    Physically Abused: No    Sexually  Abused: No    Family History:    Family History  Problem Relation Age of Onset   Hypertension Mother    Hyperlipidemia Mother    Cancer Mother    COPD Mother    Anxiety disorder Mother      ROS:  Please see the history of present illness.   All other ROS reviewed and negative.     Physical Exam/Data:   Vitals:   06/22/23 0900 06/22/23 1000 06/22/23 1155  BP:  92/72 98/86  Pulse:  (!) 147 (!) 125  Resp:  19 (!) 25  Temp: (!) 97.4 F (36.3 C)  97.6 F (36.4 C)  TempSrc: Axillary  Oral  SpO2:  94% 96%   No intake or output data in the 24 hours ending 06/22/23 1224    06/03/2023   10:15 AM 04/08/2023   10:13 AM 03/25/2023   10:36 AM  Last 3 Weights  Weight (lbs) 128 lb 12.8 oz 130 lb 135 lb  Weight (kg) 58.423 kg 58.968 kg 61.236 kg     There is no height or weight on file to calculate BMI.  General:  Well nourished, well developed, in no acute distress HEENT: normal Neck: +JVD Vascular: No carotid bruits; Distal pulses 2+ bilaterally Cardiac:  irreg Lungs:  bilateral crackles Abd: soft, nontender, no hepatomegaly  Ext: no edema Musculoskeletal:  No deformities, BUE and BLE strength normal and equal Skin: warm and dry  Neuro:  CNs 2-12 intact, no focal abnormalities noted Psych:  Normal affect     Laboratory Data:  High Sensitivity Troponin:  No results for input(s): "TROPONINIHS" in the last 720 hours.   ChemistryNo results for input(s): "NA", "K", "CL", "CO2", "GLUCOSE", "BUN", "CREATININE", "CALCIUM", "MG", "GFRNONAA", "GFRAA", "ANIONGAP" in the last 168 hours.  No results for input(s): "PROT", "ALBUMIN", "AST", "ALT", "ALKPHOS", "BILITOT" in the last 168 hours. Lipids No results for input(s): "CHOL", "TRIG", "HDL", "LABVLDL", "LDLCALC", "CHOLHDL" in the last 168 hours.  Hematology Recent Labs  Lab 06/22/23 1154  WBC 14.7*  RBC 4.15  HGB 13.7  HCT 39.7  MCV 95.7  MCH 33.0  MCHC 34.5  RDW 14.4  PLT 354   Thyroid No results for input(s): "TSH",  "FREET4" in the last 168 hours.  BNPNo results for input(s): "BNP", "PROBNP" in the last 168 hours.  DDimer No results for input(s): "DDIMER" in the last 168 hours.   Radiology/Studies:  No results found.   Assessment and Plan:   1.Longstanding.Persistent afib - prior history, first diagonsis during admission with lung infection but subsequently had recurrences - last SR I see is Jan 2023. Has been longstanding persistent afib - considerations for outpatient DCCV held off due to missed dose of anticoag. Rates had been controlled by clinic notes but dynamap  historically can be inaccurate for her and rates better assessed by EKG.  - has been on diltiazem 300mg  daily at home, toprol 25mg . Reports compliance.   - presents with RVR in setting of UTI - at [facility] only ER notes. Appears started on IV diltiazem initiially, followed by IV amiodarone. There is no documentation why diltiazem was transitioned to IV amiodarone. As of now just on IV amio, SBPs soft in the 90s and rates remain elevated 140s to 150s. Rebolus amio 150mg  and continue drip. May require cardioversion Monday if remains uncontrolled. She reports has not missed any doses of anticoag within the last 3 weeks.  - plan for short course of amio given her lung disease.  - CrCl 47 today, Cr mildly abover her baseline, pending trend may have to lower her dose to 15mg  daily.    2.Acute HF - 10/2022 echo: LVE 55-60%, no WMAs, indet diastolic function, severe LAE but no index reported - proBNP 8000, CXR with edema - volume overload in setting of afib with RVR as likely exacerbating factor - follow echo to see if any underlying cardiomyopathy, perhaps tachy mediated - ongoing signs of fluid overload, dose IV lasix 40mg  x 1.    3. UTI - per primary team.      For questions or updates, please contact [facility] Please consult [email or web address] for contact info under    Signed, Dina Rich, MD  06/22/2023 12:24  PM

## 2023-06-22 NOTE — Progress Notes (Signed)
Heart rate is too high for accurate echo at this time. 

## 2023-06-22 NOTE — Plan of Care (Signed)
  Problem: Education: Goal: Knowledge of General Education information will improve Description: Including pain rating scale, medication(s)/side effects and non-pharmacologic comfort measures Outcome: Progressing   Problem: Health Behavior/Discharge Planning: Goal: Ability to manage health-related needs will improve Outcome: Progressing   Problem: Clinical Measurements: Goal: Ability to maintain clinical measurements within normal limits will improve Outcome: Progressing Goal: Will remain free from infection Outcome: Progressing Goal: Diagnostic test results will improve Outcome: Progressing Goal: Respiratory complications will improve Outcome: Progressing Goal: Cardiovascular complication will be avoided Outcome: Progressing   Problem: Activity: Goal: Risk for activity intolerance will decrease Outcome: Progressing   Problem: Nutrition: Goal: Adequate nutrition will be maintained Outcome: Progressing   Problem: Coping: Goal: Level of anxiety will decrease Outcome: Progressing   Problem: Elimination: Goal: Will not experience complications related to bowel motility Outcome: Progressing Goal: Will not experience complications related to urinary retention Outcome: Progressing   Problem: Pain Management: Goal: General experience of comfort will improve Outcome: Progressing   Problem: Safety: Goal: Ability to remain free from injury will improve Outcome: Progressing   Problem: Skin Integrity: Goal: Risk for impaired skin integrity will decrease Outcome: Progressing   Problem: Education: Goal: Ability to demonstrate management of disease process will improve Outcome: Progressing Goal: Ability to verbalize understanding of medication therapies will improve Outcome: Progressing Goal: Individualized Educational Video(s) Outcome: Progressing   Problem: Activity: Goal: Capacity to carry out activities will improve Outcome: Progressing   Problem: Cardiac: Goal:  Ability to achieve and maintain adequate cardiopulmonary perfusion will improve Outcome: Progressing   Problem: Education: Goal: Ability to demonstrate management of disease process will improve Outcome: Progressing Goal: Ability to verbalize understanding of medication therapies will improve Outcome: Progressing Goal: Individualized Educational Video(s) Outcome: Progressing   Problem: Activity: Goal: Capacity to carry out activities will improve Outcome: Progressing   Problem: Cardiac: Goal: Ability to achieve and maintain adequate cardiopulmonary perfusion will improve Outcome: Progressing

## 2023-06-23 ENCOUNTER — Telehealth: Payer: Self-pay | Admitting: Cardiology

## 2023-06-23 ENCOUNTER — Inpatient Hospital Stay (HOSPITAL_COMMUNITY): Payer: Medicare HMO

## 2023-06-23 DIAGNOSIS — R0602 Shortness of breath: Secondary | ICD-10-CM | POA: Diagnosis not present

## 2023-06-23 DIAGNOSIS — I4891 Unspecified atrial fibrillation: Secondary | ICD-10-CM | POA: Diagnosis not present

## 2023-06-23 DIAGNOSIS — I5033 Acute on chronic diastolic (congestive) heart failure: Secondary | ICD-10-CM

## 2023-06-23 DIAGNOSIS — J449 Chronic obstructive pulmonary disease, unspecified: Secondary | ICD-10-CM | POA: Diagnosis not present

## 2023-06-23 DIAGNOSIS — E876 Hypokalemia: Secondary | ICD-10-CM

## 2023-06-23 DIAGNOSIS — E782 Mixed hyperlipidemia: Secondary | ICD-10-CM

## 2023-06-23 LAB — BASIC METABOLIC PANEL
Anion gap: 12 (ref 5–15)
Anion gap: 11 (ref 5–15)
BUN: 18 mg/dL (ref 8–23)
BUN: 19 mg/dL (ref 8–23)
CO2: 26 mmol/L (ref 22–32)
CO2: 23 mmol/L (ref 22–32)
Calcium: 10.5 mg/dL — ABNORMAL HIGH (ref 8.9–10.3)
Calcium: 10.4 mg/dL — ABNORMAL HIGH (ref 8.9–10.3)
Chloride: 97 mmol/L — ABNORMAL LOW (ref 98–111)
Chloride: 99 mmol/L (ref 98–111)
Creatinine, Ser: 1.17 mg/dL — ABNORMAL HIGH (ref 0.44–1.00)
Creatinine, Ser: 1.1 mg/dL — ABNORMAL HIGH (ref 0.44–1.00)
GFR, Estimated: 51 mL/min — ABNORMAL LOW (ref >60)
GFR, Estimated: 55 mL/min — ABNORMAL LOW (ref >60)
Glucose, Bld: 109 mg/dL — ABNORMAL HIGH (ref 70–99)
Glucose, Bld: 121 mg/dL — ABNORMAL HIGH (ref 70–99)
Potassium: 2.9 mmol/L — ABNORMAL LOW (ref 3.5–5.1)
Potassium: 4.1 mmol/L (ref 3.5–5.1)
Sodium: 135 mmol/L (ref 135–145)
Sodium: 133 mmol/L — ABNORMAL LOW (ref 135–145)

## 2023-06-23 LAB — CBC
HCT: 42.5 % (ref 36.0–46.0)
Hemoglobin: 14.8 g/dL (ref 12.0–15.0)
MCH: 33.1 pg (ref 26.0–34.0)
MCHC: 34.8 g/dL (ref 30.0–36.0)
MCV: 95.1 fL (ref 80.0–100.0)
Platelets: 397 10*3/uL (ref 150–400)
RBC: 4.47 MIL/uL (ref 3.87–5.11)
RDW: 14.3 % (ref 11.5–15.5)
WBC: 16.8 10*3/uL — ABNORMAL HIGH (ref 4.0–10.5)
nRBC: 0 % (ref 0.0–0.2)

## 2023-06-23 LAB — ECHOCARDIOGRAM COMPLETE
Area-P 1/2: 4.45 cm2
Calc EF: 40.7 %
Height: 66 in
P 1/2 time: 500 ms
S' Lateral: 3 cm
Single Plane A2C EF: 38.8 %
Single Plane A4C EF: 44.8 %
Weight: 1884.8 [oz_av]

## 2023-06-23 LAB — PROTIME-INR
INR: 3.5 — ABNORMAL HIGH (ref 0.8–1.2)
Prothrombin Time: 35.7 s — ABNORMAL HIGH (ref 11.4–15.2)

## 2023-06-23 MED ORDER — CHLORHEXIDINE GLUCONATE CLOTH 2 % EX PADS
6.0000 | MEDICATED_PAD | Freq: Every day | CUTANEOUS | Status: DC
  Administered 2023-06-24 – 2023-06-27 (×4): 6 via TOPICAL

## 2023-06-23 MED ORDER — RIVAROXABAN 15 MG PO TABS
15.0000 mg | ORAL_TABLET | Freq: Every day | ORAL | Status: DC
  Administered 2023-06-23 – 2023-06-26 (×4): 15 mg via ORAL
  Filled 2023-06-23 (×5): qty 1

## 2023-06-23 MED ORDER — MUPIROCIN 2 % EX OINT
1.0000 | TOPICAL_OINTMENT | Freq: Two times a day (BID) | CUTANEOUS | Status: DC
  Administered 2023-06-24 – 2023-06-27 (×7): 1 via NASAL
  Filled 2023-06-23 (×3): qty 22

## 2023-06-23 MED ORDER — CEFTRIAXONE SODIUM 1 G IJ SOLR
1.0000 g | INTRAMUSCULAR | Status: DC
  Administered 2023-06-23 – 2023-06-26 (×4): 1 g via INTRAVENOUS
  Filled 2023-06-23 (×4): qty 10

## 2023-06-23 MED ORDER — POTASSIUM CHLORIDE CRYS ER 20 MEQ PO TBCR
40.0000 meq | EXTENDED_RELEASE_TABLET | ORAL | Status: AC
  Administered 2023-06-23 (×2): 40 meq via ORAL
  Filled 2023-06-23 (×2): qty 2

## 2023-06-23 MED ORDER — AZITHROMYCIN 500 MG PO TABS
500.0000 mg | ORAL_TABLET | Freq: Every day | ORAL | Status: AC
  Administered 2023-06-23 – 2023-06-27 (×5): 500 mg via ORAL
  Filled 2023-06-23 (×5): qty 1

## 2023-06-23 MED ORDER — SODIUM CHLORIDE 0.9% FLUSH
10.0000 mL | Freq: Two times a day (BID) | INTRAVENOUS | Status: DC
Start: 2023-06-23 — End: 2023-06-24
  Administered 2023-06-23 – 2023-06-24 (×2): 10 mL via INTRAVENOUS

## 2023-06-23 MED ORDER — AMIODARONE LOAD VIA INFUSION
150.0000 mg | Freq: Once | INTRAVENOUS | Status: AC
  Administered 2023-06-23: 150 mg via INTRAVENOUS
  Filled 2023-06-23: qty 83.34

## 2023-06-23 NOTE — Progress Notes (Signed)
2D echo attempted, staff in room. Will try later.

## 2023-06-23 NOTE — Assessment & Plan Note (Signed)
Patient continue in atrial fibrillation/ atrial flutter rhythm with rate up to 150 bpm.  Blood pressure has been stable 99 to 100 mmHg.   Plan to continue amiodarone for rate control and anticoagulation with rivaroxaban. Direct current cardioversion today.

## 2023-06-23 NOTE — Assessment & Plan Note (Addendum)
No clinical signs of acute exacerbation.  Oxymetry is 95% on 3 L/min pre [place].  Continue bronchodilator therapy.   Chest radiograph from [facility] with signs of pulmonary edema and left lung base atelectasis.  CT chest with patchy nodular tree in bud infiltrates in both lungs with more dense consolidation in the lower lungs.  Positive emphysematous changes in the upper lungs.  Positive leukocytosis, will place patient on empiric antibiotic therapy for community acquired pneumonia and will repeat chest radiograph.  Follow up cell count, and check blood cultures.  Hold on systemic corticosteroids for now.  Add incentive spirometer and flutter valve,  Out of bed to chair tid with meals.  Consult nutrition.

## 2023-06-23 NOTE — Telephone Encounter (Signed)
Error

## 2023-06-23 NOTE — Progress Notes (Addendum)
Progress Note   Patient: Carrie Case ION:[id number] DOB: 1955/10/16 DOA: 06/22/2023     1 DOS: the patient was seen and examined on 06/23/2023   Brief hospital course: [name] was admitted to the hospital with the working diagnosis of atrial fibrillation and heart failure in the setting of community acquired pneumonia.   67 yo female with the past medical history of atrial fibrillation, COPD, carotid artery stenosis, peripheral vascular disease and dyslipidemia who presented with dyspnea. Reported dyspnea for 2 days, worsening symptoms prompting her to call EMS. She was found with heart rate in the 200's and transported to [facility] ED, where she was found in heart failure decompensation. She was transferred to [facility] for further medical therapy. At the time of her transfer her blood pressure was 92/72, HR 147, RR25 and 02 saturation 96%, lungs with no wheezing or rhonchi, heart with S1 and S2 present, irregularly irregular, abdomen with no distention and no lower extremity edema.   Na 136, K 3,7 Cl 104, bicarbonate 22, glucose 112, bun 18, cr 1,0  AST 109, ALT 52,  BNP 746  Wbc 14.7 hgb 13,7 plt 354  TSH 0,9   EKG 138 bpm, normal axis, normal intervals, atrial fibrillation rhythm with no significant ST segment or T wave changes.   Patient was placed on Amiodarone drip and received furosemide.    Assessment and Plan: * Atrial fibrillation with rapid ventricular response (HCC) Patient continue in atrial fibrillation rhythm with rate up to 150 bpm.  Blood pressure has been stable at 100 mmHg.   Plan to continue amiodarone for rate control and anticoagulation with rivaroxaban. Direct current cardioversion in am.   Acute on chronic diastolic CHF (congestive heart failure) (HCC) Improved volume status/ Follow up on echocardiogram.  Holding diuretic therapy for now.  Continue rate control atrial fibrillation.   Acute hypoxemic respiratory failure due to cardiogenic pulmonary  edema. Continue supplemental 02 per [place], likely component of COPD.    COPD  GOLD 2 / still smoking  No clinical signs of acute exacerbation.  Oxymetry is 95% on 3 L/min pre [place].  Continue bronchodilator therapy.   Chest radiograph from [facility] with signs of pulmonary edema and left lung base atelectasis.  CT chest with patchy nodular tree in bud infiltrates in both lungs with more dense consolidation in the lower lungs.  Positive emphysematous changes in the upper lungs.  Positive leukocytosis, will place patient on empiric antibiotic therapy for community acquired pneumonia and will repeat chest radiograph.  Follow up cell count, and check blood cultures.  Hold on systemic corticosteroids for now.  Add incentive spirometer and flutter valve,  Out of bed to chair tid with meals.  Consult nutrition.   Hypokalemia Renal function with serum cr at 1,17 with K at 2,9 and serum bicarbonate at 26.  Na 135   Plan to continue K correction with Kcl 40 meq x2 and follow up renal function in am.  Holding on diuretic therapy for now.   Hyperlipidemia Continue with statin therapy.         Subjective: patient feeling very tired and deconditioned, denies chest pain or dyspnea, no palpitations   Physical Exam: Vitals:   06/23/23 0600 06/23/23 0721 06/23/23 0809 06/23/23 1109  BP:  90/79    Pulse: (!) 122     Resp: 20     Temp:  97.8 F (36.6 C)  97.6 F (36.4 C)  TempSrc:  Oral  Oral  SpO2: 94%  95%  Weight:      Height:       Neurology awake and alert ENT with mild pallor Cardiovascular with S1 and S2 present, irregularly irregular with no gallops, rubs or murmurs No JVD No lower extremity edema Respiratory with rales bilaterally at bases with no wheezing, scattered rhonchi, and prolonged expiratory phase Abdomen with no distention  Data Reviewed:    Family Communication: no family at the bedside   Disposition: Status is: Inpatient Remains inpatient appropriate because:  atrial fibrillation   Planned Discharge Destination: Home      Author: Coralie Keens, MD 06/23/2023 11:14 AM  For on call review [email or web address].

## 2023-06-23 NOTE — Progress Notes (Signed)
  Echocardiogram 2D Echocardiogram has been performed.  Carrie Case 06/23/2023, 10:39 AM

## 2023-06-23 NOTE — Progress Notes (Signed)
Rounding Note    Patient Name: Carrie Case Date of Encounter: 06/23/2023  [facility] Cardiologist: Dina Rich, MD   Subjective   No complaints  Inpatient Medications    Scheduled Meds:  amiodarone  150 mg Intravenous Once   arformoterol  15 mcg Nebulization BID   And   umeclidinium bromide  1 puff Inhalation Daily   pantoprazole  40 mg Oral Daily   potassium chloride  40 mEq Oral Q4H   rivaroxaban  20 mg Oral Daily   rosuvastatin  20 mg Oral Daily   Continuous Infusions:  amiodarone 30 mg/hr (06/23/23 0700)   PRN Meds: acetaminophen **OR** acetaminophen, ipratropium-albuterol, ondansetron **OR** ondansetron (ZOFRAN) IV, senna-docusate   Vital Signs    Vitals:   06/23/23 0359 06/23/23 0400 06/23/23 0600 06/23/23 0721  BP: 101/87   90/79  Pulse: (!) 116 (!) 128 (!) 122   Resp: (!) 22 (!) 22 20   Temp:    97.8 F (36.6 C)  TempSrc:    Oral  SpO2: 92% 96% 94%   Weight: 53.4 kg     Height:        Intake/Output Summary (Last 24 hours) at 06/23/2023 0750 Last data filed at 06/23/2023 0700 Gross per 24 hour  Intake 936.62 ml  Output --  Net 936.62 ml      06/23/2023    3:59 AM 06/22/2023    1:00 PM 06/22/2023    9:00 AM  Last 3 Weights  Weight (lbs) 117 lb 12.8 oz 128 lb 12 oz 128 lb 12.8 oz  Weight (kg) 53.434 kg 58.4 kg 58.423 kg      Telemetry    Afib elevated rates - Personally Reviewed  ECG    N/a - Personally Reviewed  Physical Exam   GEN: No acute distress.   Neck: No JVD Cardiac: irreg, tachy Respiratory: coarse bilaterally GI: Soft, nontender, non-distended  MS: No edema; No deformity. Neuro:  Nonfocal  Psych: Normal affect   Labs    High Sensitivity Troponin:  No results for input(s): "TROPONINIHS" in the last 720 hours.   Chemistry Recent Labs  Lab 06/22/23 1154 06/23/23 0234  NA 136 135  K 3.7 2.9*  CL 104 97*  CO2 22 26  GLUCOSE 112* 109*  BUN 18 18  CREATININE 1.09* 1.17*  CALCIUM 10.1 10.5*   MG 2.3  --   PROT 6.9  --   ALBUMIN 2.2*  --   AST 109*  --   ALT 52*  --   ALKPHOS 122  --   BILITOT 0.6  --   GFRNONAA 56* 51*  ANIONGAP 10 12    Lipids  Recent Labs  Lab 06/22/23 1154  CHOL 59  TRIG 75  HDL 20*  LDLCALC 24  CHOLHDL 3.0    Hematology Recent Labs  Lab 06/22/23 1154 06/23/23 0234  WBC 14.7* 16.8*  RBC 4.15 4.47  HGB 13.7 14.8  HCT 39.7 42.5  MCV 95.7 95.1  MCH 33.0 33.1  MCHC 34.5 34.8  RDW 14.4 14.3  PLT 354 397   Thyroid  Recent Labs  Lab 06/22/23 1154  TSH 0.905    BNP Recent Labs  Lab 06/22/23 1154  BNP 746.2*    DDimer No results for input(s): "DDIMER" in the last 168 hours.   Radiology    No results found.  Cardiac Studies     Patient Profile     Carrie Case is a 67 y.o. female with  a hx of long standing persistent afib, COPD, PAD  who is being seen 06/22/2023 for the evaluation of afib with RVR at the request of Dr Kirke Corin.   Assessment & Plan    1.Longstanding.Persistent afib - prior history, first diagonsis during admission with lung infection but subsequently had recurrences - last SR I see is Jan 2023. Has been longstanding persistent afib - considerations for outpatient DCCV held off due to missed dose of anticoag. Rates had been controlled by clinic notes but dynamap historically can be inaccurate for her and rates better assessed by EKG.  - has been on diltiazem 300mg  daily at home, toprol 25mg . Reports compliance.    - presents with RVR in setting of UTI - presented ot  [facility] ER. Appears started on IV diltiazem initiially, followed by IV amiodarone. There is no documentation why diltiazem was transitioned to IV amiodarone.  - continued on IV amiodarone here, rates remain elevated. Rebolus 150mg  and continue drip.  - She reports has not missed any doses of anticoag within the last 3 weeks, plan for DCCV if does not self convert.  - plan for short course of amio given her lung disease as outpatient.    Estimated Creatinine Clearance: 39.9 mL/min (A) (by C-G formula based on SCr of 1.17 mg/dL (H)). Lower her xarelto to 15mg  daily.  - will schedule DCCV for tomorrow  Informed Consent   Shared Decision Making/Informed Consent{  The risks (stroke, cardiac arrhythmias rarely resulting in the need for a temporary or permanent pacemaker, skin irritation or burns and complications associated with conscious sedation including aspiration, arrhythmia, respiratory failure and death), benefits (restoration of normal sinus rhythm) and alternatives of a direct current cardioversion were explained in detail to Carrie Case and she agrees to proceed.          2.Acute HF - 10/2022 echo: LVE 55-60%, no WMAs, indet diastolic function, severe LAE but no index reported - proBNP 8000, CXR with edema - volume overload in setting of afib with RVR as likely exacerbating factor - received IV lasix 40mg  x 1 yesterday, I/Os incomplete. Mild uptrend in Cr, hold on further diuresis today.  - follow echo to see if any underlying cardiomyopathy, perhaps tachy mediated - obtain echo when rates are controlled     3. UTI - per primary team.  For questions or updates, please contact [facility] Please consult [email or web address] for contact info under        Signed, Dina Rich, MD  06/23/2023, 7:50 AM

## 2023-06-23 NOTE — Assessment & Plan Note (Signed)
Renal function with serum cr at 1,17 with K at 2,9 and serum bicarbonate at 26.  Na 135   Plan to continue K correction with Kcl 40 meq x2 and follow up renal function in am.  Holding on diuretic therapy for now.

## 2023-06-23 NOTE — Hospital Course (Addendum)
Mrs. Carrie Case was admitted to the hospital with the working diagnosis of atrial fibrillation and heart failure in the setting of community acquired pneumonia.   67 yo female with the past medical history of atrial fibrillation, COPD, carotid artery stenosis, peripheral vascular disease and dyslipidemia who presented with dyspnea. Reported dyspnea for 2 days, worsening symptoms prompting her to call EMS. She was found with heart rate in the 200's and transported to [facility] ED, where she was found in heart failure decompensation. She was transferred to [facility] for further medical therapy. At the time of her transfer her blood pressure was 92/72, HR 147, RR25 and 02 saturation 96%, lungs with no wheezing or rhonchi, heart with S1 and S2 present, irregularly irregular, abdomen with no distention and no lower extremity edema.   Na 136, K 3,7 Cl 104, bicarbonate 22, glucose 112, bun 18, cr 1,0  AST 109, ALT 52,  BNP 746  Wbc 14.7 hgb 13,7 plt 354  TSH 0,9   EKG 138 bpm, normal axis, normal intervals, atrial fibrillation rhythm with no significant ST segment or T wave changes.   Patient was placed on Amiodarone drip and received furosemide.  Started on antibiotic therapy for community acquired pneumonia.  11/04 direct current cardioversion. 11/05 patient is back in atrial fibrillation with  RVR.

## 2023-06-23 NOTE — Assessment & Plan Note (Signed)
Continue with statin therapy.  ?

## 2023-06-23 NOTE — Assessment & Plan Note (Addendum)
Improved volume status/ Follow up on echocardiogram.  Holding diuretic therapy for now.  Continue rate control atrial fibrillation.   Acute hypoxemic respiratory failure due to cardiogenic pulmonary edema. Continue supplemental 02 per [place], likely component of COPD.

## 2023-06-24 ENCOUNTER — Encounter (HOSPITAL_COMMUNITY): Disposition: A | Discharge status: Home Health | Payer: Self-pay | Attending: Internal Medicine

## 2023-06-24 ENCOUNTER — Ambulatory Visit: Payer: Medicare HMO | Admitting: Internal Medicine

## 2023-06-24 ENCOUNTER — Inpatient Hospital Stay (HOSPITAL_COMMUNITY): Payer: Medicare HMO | Admitting: Anesthesiology

## 2023-06-24 DIAGNOSIS — I5033 Acute on chronic diastolic (congestive) heart failure: Secondary | ICD-10-CM | POA: Diagnosis not present

## 2023-06-24 DIAGNOSIS — I4891 Unspecified atrial fibrillation: Secondary | ICD-10-CM | POA: Diagnosis not present

## 2023-06-24 DIAGNOSIS — E876 Hypokalemia: Secondary | ICD-10-CM | POA: Diagnosis not present

## 2023-06-24 DIAGNOSIS — J449 Chronic obstructive pulmonary disease, unspecified: Secondary | ICD-10-CM | POA: Diagnosis not present

## 2023-06-24 DIAGNOSIS — E440 Moderate protein-calorie malnutrition: Secondary | ICD-10-CM

## 2023-06-24 HISTORY — PX: CARDIOVERSION: EP1203

## 2023-06-24 LAB — BASIC METABOLIC PANEL
Anion gap: 12 (ref 5–15)
BUN: 17 mg/dL (ref 8–23)
CO2: 25 mmol/L (ref 22–32)
Calcium: 10.5 mg/dL — ABNORMAL HIGH (ref 8.9–10.3)
Chloride: 97 mmol/L — ABNORMAL LOW (ref 98–111)
Creatinine, Ser: 1.14 mg/dL — ABNORMAL HIGH (ref 0.44–1.00)
GFR, Estimated: 53 mL/min — ABNORMAL LOW (ref >60)
Glucose, Bld: 97 mg/dL (ref 70–99)
Potassium: 4 mmol/L (ref 3.5–5.1)
Sodium: 134 mmol/L — ABNORMAL LOW (ref 135–145)

## 2023-06-24 LAB — CBC
HCT: 41.5 % (ref 36.0–46.0)
Hemoglobin: 14.3 g/dL (ref 12.0–15.0)
MCH: 32.6 pg (ref 26.0–34.0)
MCHC: 34.5 g/dL (ref 30.0–36.0)
MCV: 94.7 fL (ref 80.0–100.0)
Platelets: 409 10*3/uL — ABNORMAL HIGH (ref 150–400)
RBC: 4.38 MIL/uL (ref 3.87–5.11)
RDW: 14.6 % (ref 11.5–15.5)
WBC: 15.5 10*3/uL — ABNORMAL HIGH (ref 4.0–10.5)
nRBC: 0 % (ref 0.0–0.2)

## 2023-06-24 SURGERY — CARDIOVERSION (CATH LAB)
Anesthesia: General

## 2023-06-24 MED ORDER — PROPOFOL 10 MG/ML IV BOLUS
INTRAVENOUS | Status: DC | PRN
  Administered 2023-06-24: 60 mg via INTRAVENOUS

## 2023-06-24 MED ORDER — METOPROLOL TARTRATE 5 MG/5ML IV SOLN
5.0000 mg | INTRAVENOUS | Status: DC | PRN
  Administered 2023-06-24 – 2023-06-25 (×2): 5 mg via INTRAVENOUS
  Filled 2023-06-24 (×2): qty 5

## 2023-06-24 MED ORDER — UMECLIDINIUM BROMIDE 62.5 MCG/ACT IN AEPB
1.0000 | INHALATION_SPRAY | Freq: Every day | RESPIRATORY_TRACT | Status: DC
  Administered 2023-06-25 – 2023-06-26 (×2): 1 via RESPIRATORY_TRACT
  Filled 2023-06-24: qty 7

## 2023-06-24 MED ORDER — ADULT MULTIVITAMIN W/MINERALS CH
1.0000 | ORAL_TABLET | Freq: Every day | ORAL | Status: DC
  Administered 2023-06-25 – 2023-06-27 (×3): 1 via ORAL
  Filled 2023-06-24 (×3): qty 1

## 2023-06-24 MED ORDER — TRAMADOL HCL 50 MG PO TABS
50.0000 mg | ORAL_TABLET | Freq: Four times a day (QID) | ORAL | Status: DC | PRN
  Administered 2023-06-24 – 2023-06-26 (×4): 50 mg via ORAL
  Filled 2023-06-24 (×4): qty 1

## 2023-06-24 SURGICAL SUPPLY — 1 items: PAD DEFIB RADIO PHYSIO CONN (PAD) ×1 IMPLANT

## 2023-06-24 NOTE — H&P (View-Only) (Signed)
Patient Name: Carrie Case Date of Encounter: 06/24/2023 [name] Cardiologist: Dina Rich, MD   Interval Summary  .    Feeling tired.  Denies palpitations.   Vital Signs .    Vitals:   06/24/23 0413 06/24/23 0500 06/24/23 0802 06/24/23 0827  BP: 99/78   100/78  Pulse: (!) 128   (!) 150  Resp: 20   (!) 25  Temp: (!) 97.2 F (36.2 C)   (!) 97.3 F (36.3 C)  TempSrc: Axillary   Axillary  SpO2: 97%  97% 95%  Weight:  53.7 kg    Height:        Intake/Output Summary (Last 24 hours) at 06/24/2023 0836 Last data filed at 06/24/2023 0451 Gross per 24 hour  Intake 678.62 ml  Output --  Net 678.62 ml      06/24/2023    5:00 AM 06/23/2023    3:59 AM 06/22/2023    1:00 PM  Last 3 Weights  Weight (lbs) 118 lb 4.8 oz 117 lb 12.8 oz 128 lb 12 oz  Weight (kg) 53.661 kg 53.434 kg 58.4 kg      Telemetry/ECG    Atrial fibrillation with RVR.  - Personally Reviewed  Physical Exam .    VS:  BP 100/78 (BP Location: Right Arm) Comment (BP Location): pt requested this arm  Pulse (!) 150   Temp (!) 97.3 F (36.3 C) (Axillary)   Resp (!) 25   Ht 5\' 6"  (1.676 m)   Wt 53.7 kg Comment: Scale A  SpO2 95%   BMI 19.09 kg/m  , BMI Body mass index is 19.09 kg/m. GENERAL:  Chronically ill-appearing.  Frail.  HEENT: Pupils equal round and reactive, fundi not visualized, oral mucosa unremarkable NECK:  No jugular venous distention, waveform within normal limits, carotid upstroke brisk and symmetric, no bruits, no thyromegaly LUNGS:  Bilateral rhonchi.  +productive cough HEART:  Tachycardic.  Irregularly irregular.  PMI not displaced or sustained,S1 and S2 within normal limits, no S3, no S4, no clicks, no rubs, no murmurs ABD:  Flat, positive bowel sounds normal in frequency in pitch, no bruits, no rebound, no guarding, no midline pulsatile mass, no hepatomegaly, no splenomegaly EXT:  2 plus pulses throughout, no edema, no cyanosis no clubbing SKIN:  No rashes no  nodules NEURO:  Cranial nerves II through XII grossly intact, motor grossly intact throughout PSYCH:  Cognitively intact, oriented to person place and time   Assessment & Plan .     # Persistent afib with RVR:  She has a history of atrial fibrillation.  Now with rapid ventricular response in the setting of UTI.  Plan for DCCV today.  She has not missed any doses of her Xarelto.  Informed Consent   Shared Decision Making/Informed Consent The risks (stroke, cardiac arrhythmias rarely resulting in the need for a temporary or permanent pacemaker, skin irritation or burns and complications associated with conscious sedation including aspiration, arrhythmia, respiratory failure and death), benefits (restoration of normal sinus rhythm) and alternatives of a direct current cardioversion were explained in detail to Ms. Vivia Ewing and she agrees to proceed.   # Acute on chronic HFpEF:  Echo this admission revealed LVEF 55-60%.  Mild LVH.  She has moderate mitral annular calcification.  Right atrial pressure was 3 mmHg.  BNP was 746.  Her lungs have more rhonchi than crackles.  Blood pressure has been too low for diuresis thus far.  Will attempt gentle diuresis after cardioversion if BP  allows.  # Hyperlipidemia: # Carotid stenosis: # PAD:  Continue rosuvastatin and Xarelto.  # CAP: # COPD: Management per primary team.   # Tobacco abuse;  Continue to encourage smoking cessation.   For questions or updates, please contact [facility] Please consult [email or web address] for contact info under        Signed, Chilton Si, MD

## 2023-06-24 NOTE — Plan of Care (Signed)
  Problem: Clinical Measurements: Goal: Will remain free from infection Outcome: Progressing   Problem: Activity: Goal: Risk for activity intolerance will decrease Outcome: Progressing   Problem: Elimination: Goal: Will not experience complications related to bowel motility Outcome: Progressing Goal: Will not experience complications related to urinary retention Outcome: Progressing   Problem: Safety: Goal: Ability to remain free from injury will improve Outcome: Progressing   Problem: Skin Integrity: Goal: Risk for impaired skin integrity will decrease Outcome: Progressing   

## 2023-06-24 NOTE — Progress Notes (Signed)
Initial Nutrition Assessment  DOCUMENTATION CODES:   Non-severe (moderate) malnutrition in context of chronic illness  INTERVENTION:  Liberalize diet to regular to remove restrictions and encourage oral intake.   Provide chocolate Carnation Breakfast Essentials in whole milk po TID with meals, each supplement provides 290 kcal and 13 grams of protein.   Provide Magic cup po BID with lunch and dinner, each supplement provides 290 kcal and 9 grams of protein.   Monitor magnesium, potassium, and phosphorus daily for at least 3 days, MD to replete as needed, as pt is at risk for refeeding syndrome.   Provide multivitamin with minerals po daily.  NUTRITION DIAGNOSIS:   Moderate Malnutrition related to chronic illness (COPD, CHF) as evidenced by moderate fat depletion, moderate muscle depletion, severe muscle depletion.  GOAL:   Patient will meet greater than or equal to 90% of their needs  MONITOR:   PO intake, Supplement acceptance, Labs, Weight trends, I & O's  REASON FOR ASSESSMENT:   Consult Assessment of nutrition requirement/status  ASSESSMENT:   67 year old female with PMHx of A-fib, COPD, CHF, coronary artery stenosis, peripheral vascular disease and dyslipidemia admitted with A-fib with RVR, acute on chronic diastolic CHF, PNA.  11/4: s/p DCCV  Met with pt and her daughter at bedside. At time of RD assessment pt was very upset about having to stay admitted in the hospital so was not able to provide the best history. She reports her appetite has been poor for a while now. She is unable to provide details or timeline for how long her appetite has been poor. Endorses lack of hunger. Pt reports she is "not eating anything and won't eat anything." However, unable to provide further information. Unsure if this report that she is "not eating anything" is a fully reliable report. Daughter reports pt will occasionally eat a small meal like a sandwich or burger and that she also  likes orange sherbet. Pt reports she will drink coffee and soda. She reports she was given an Ensure to try yesterday and she did not like them because they were too thick. RD discussed importance of adequate intake of calories and protein. Discussed other oral nutrition supplements available for pt to try. Pt kept repeating that she "would not eat anything." RD will order supplements for pt to try. Encouraged intake of supplements and meals to help meet calorie/protein needs.  Pt reports she has lost a lot of weight but unable to provide any further details about UBW or time frame of weight loss. Per review of chart pt was 59.5 kg on 02/25/23. Currently 53.7 kg. She has lost 5.8 kg or 9.7% weight over the past 4 months, which is significant for time frame. However, with history of HF, unsure of impact fluid status may have had on weight trend with pt unable to provide history.  Medications reviewed and include: azithromycin, pantoprazole, amiodarone, ceftriaxone  Labs reviewed: Sodium 134, Chloride 97, Creatinine 1.14  UOP: 1 occurrence unmeasured UOP in previous 24 hours  I/O: +1615.2 mL since admission  Will use nutrition diagnosis of moderate chronic malnutrition, but pt could have severe chronic malnutrition pending more accurate weight/nutrition history. Unsure of impact from fluid status on weight trends in chart.  NUTRITION - FOCUSED PHYSICAL EXAM:  Flowsheet Row Most Recent Value  Orbital Region Moderate depletion  Upper Arm Region Moderate depletion  Thoracic and Lumbar Region Mild depletion  Buccal Region Moderate depletion  Temple Region Moderate depletion  Clavicle Bone Region Severe depletion  Clavicle and Acromion Bone Region Severe depletion  Scapular Bone Region Moderate depletion  Dorsal Hand Severe depletion  Patellar Region Moderate depletion  Anterior Thigh Region Moderate depletion  Posterior Calf Region Severe depletion  Edema (RD Assessment) None  Hair Reviewed   Eyes Reviewed  Mouth Reviewed  Skin Reviewed  Nails Reviewed      Diet Order:   Diet Order             Diet Heart Room service appropriate? Yes; Fluid consistency: Thin  Diet effective now                  EDUCATION NEEDS:   No education needs have been identified at this time  Skin:  Skin Assessment: Reviewed RN Assessment  Last BM:  06/21/23 per chart  Height:   Ht Readings from Last 1 Encounters:  06/22/23 5\' 6"  (1.676 m)   Weight:   Wt Readings from Last 1 Encounters:  06/24/23 53.7 kg   Ideal Body Weight:  59.1 kg  BMI:  Body mass index is 19.09 kg/m.  Estimated Nutritional Needs:   Kcal:  1500-1700  Protein:  75-85 grams  Fluid:  1.5-1.7 L/day  Letta Median, MS, RD, LDN, CNSC Pager number available on Amion

## 2023-06-24 NOTE — Progress Notes (Signed)
Progress Note   Patient: Carrie Case UXN:[id number] DOB: 1956/04/01 DOA: 06/22/2023     2 DOS: the patient was seen and examined on 06/24/2023   Brief hospital course: [name] was admitted to the hospital with the working diagnosis of atrial fibrillation and heart failure in the setting of community acquired pneumonia.   67 yo female with the past medical history of atrial fibrillation, COPD, carotid artery stenosis, peripheral vascular disease and dyslipidemia who presented with dyspnea. Reported dyspnea for 2 days, worsening symptoms prompting her to call EMS. She was found with heart rate in the 200's and transported to [facility] ED, where she was found in heart failure decompensation. She was transferred to [facility] for further medical therapy. At the time of her transfer her blood pressure was 92/72, HR 147, RR25 and 02 saturation 96%, lungs with no wheezing or rhonchi, heart with S1 and S2 present, irregularly irregular, abdomen with no distention and no lower extremity edema.   Na 136, K 3,7 Cl 104, bicarbonate 22, glucose 112, bun 18, cr 1,0  AST 109, ALT 52,  BNP 746  Wbc 14.7 hgb 13,7 plt 354  TSH 0,9   EKG 138 bpm, normal axis, normal intervals, atrial fibrillation rhythm with no significant ST segment or T wave changes.   Patient was placed on Amiodarone drip and received furosemide.  Started on antibiotic therapy for community acquired pneumonia.  11/04 direct current cardioversion.   Assessment and Plan: * Atrial fibrillation with rapid ventricular response (HCC) Patient continue in atrial fibrillation/ atrial flutter rhythm with rate up to 150 bpm.  Blood pressure has been stable 99 to 100 mmHg.   Plan to continue amiodarone for rate control and anticoagulation with rivaroxaban. Direct current cardioversion today.   Acute on chronic diastolic CHF (congestive heart failure) (HCC) Echocardiogram with preserved LV systolic function with EF 55 to 60%, mid left  ventricular hypertrophy, RV systolic function preserved, RVSP 46.6, LA and RA with moderate dilatation, mild to moderate aortic regurgitation.   Volume status has improved.  Continue to hold on loop diuretic therapy. Continue rate control atrial fibrillation.   Acute hypoxemic respiratory failure due to cardiogenic pulmonary edema. Continue supplemental 02 per [place], likely component of COPD.    COPD  GOLD 2 / still smoking  No clinical signs of acute exacerbation.  Oxymetry is 95% on 3 L/min pre [place].  Continue bronchodilator therapy.   Chest radiograph from [facility] with signs of pulmonary edema and left lung base atelectasis.  CT chest with patchy nodular tree in bud infiltrates in both lungs with more dense consolidation in the lower lungs.  Positive emphysematous changes in the upper lungs.  11/03  chest radiograph with hyperinflation, increased lung marking. Positive interstitial infiltrates at bases bilaterally, no effusions.   Community acquired pneumonia  Azithromycin and ceftriaxone.  Hold on systemic corticosteroids for now.  Incentive spirometer and flutter valve,  Out of bed to chair tid with meals.  Consult nutrition.   Hypokalemia Hyponatremia.  Renal function with serum cr at 1,14 with K at 4,0 and serum bicarbonate at 25.  Na 134   Continue close follow up renal function and electrolytes.   Hyperlipidemia Continue with statin therapy.         Subjective: Patient continue very weak and deconditioned, has no chest pain or dyspnea, no palpitations or chest pain   Physical Exam: Vitals:   06/24/23 0802 06/24/23 0827 06/24/23 0900 06/24/23 0919  BP:  100/78  (!) 123/100  Pulse:  (!) 150  (!) 126  Resp:  (!) 25  (!) 34  Temp:  (!) 97.3 F (36.3 C)  97.8 F (36.6 C)  TempSrc:  Axillary    SpO2: 97% 95% 95% 95%  Weight:      Height:       Neurology awake and alert ENT with mild pallor Cardiovascular with S1 and S2 present and tachycardic, irregularly  irregular with no gallops, rubs or murmurs Respiratory with prolonged expiratory phase and rales at bases, scattered rhonchi with no wheezing Abdomen with no distention  No lower extremity edema.  Data Reviewed:    Family Communication: I spoke with patient's daughter at the bedside, we talked in detail about patient's condition, plan of care and prognosis and all questions were addressed.   Disposition: Status is: Inpatient Remains inpatient appropriate because: atrial fibrillation requiring IV antiarrhythmic therapy and direct current cardioversion   Planned Discharge Destination: Home     Author: Coralie Keens, MD 06/24/2023 10:20 AM  For on call review [email or web address].

## 2023-06-24 NOTE — Progress Notes (Signed)
Heart Failure Navigator Progress Note  Assessed for Heart & Vascular TOC clinic readiness.  Patient does not meet criteria per Dr. Ella Jubilee. [facility] appointment on 11/11. . Navigator will sign off at this time.  Roxy Horseman, RN, BSN [facility] Heart Failure Navigator Secure Chat Only

## 2023-06-24 NOTE — CV Procedure (Signed)
Procedure:   DCCV  Indication:  Symptomatic atrial fibrillation  Procedure Note:  The patient signed informed consent.  They have had had therapeutic anticoagulation with Xarelto greater than 3 weeks.  Anesthesia was administered by Dr. Tacy Dura.  Adequate airway was maintained throughout and vital followed per protocol.  They were cardioverted x 1 with 200J of biphasic synchronized energy.  They converted to NSR with rate 90s.  There were no apparent complications.  The patient had normal neuro status and respiratory status post procedure with vitals stable as recorded elsewhere.    Follow up:  They will continue on current medical therapy and follow up with cardiology as scheduled.  Epifanio Lesches, MD 06/24/2023 10:39 AM

## 2023-06-24 NOTE — Progress Notes (Signed)
Patient Name: Carrie Case Date of Encounter: 06/24/2023 [name] Cardiologist: Dina Rich, MD   Interval Summary  .    Feeling tired.  Denies palpitations.   Vital Signs .    Vitals:   06/24/23 0413 06/24/23 0500 06/24/23 0802 06/24/23 0827  BP: 99/78   100/78  Pulse: (!) 128   (!) 150  Resp: 20   (!) 25  Temp: (!) 97.2 F (36.2 C)   (!) 97.3 F (36.3 C)  TempSrc: Axillary   Axillary  SpO2: 97%  97% 95%  Weight:  53.7 kg    Height:        Intake/Output Summary (Last 24 hours) at 06/24/2023 0836 Last data filed at 06/24/2023 0451 Gross per 24 hour  Intake 678.62 ml  Output --  Net 678.62 ml      06/24/2023    5:00 AM 06/23/2023    3:59 AM 06/22/2023    1:00 PM  Last 3 Weights  Weight (lbs) 118 lb 4.8 oz 117 lb 12.8 oz 128 lb 12 oz  Weight (kg) 53.661 kg 53.434 kg 58.4 kg      Telemetry/ECG    Atrial fibrillation with RVR.  - Personally Reviewed  Physical Exam .    VS:  BP 100/78 (BP Location: Right Arm) Comment (BP Location): pt requested this arm  Pulse (!) 150   Temp (!) 97.3 F (36.3 C) (Axillary)   Resp (!) 25   Ht 5\' 6"  (1.676 m)   Wt 53.7 kg Comment: Scale A  SpO2 95%   BMI 19.09 kg/m  , BMI Body mass index is 19.09 kg/m. GENERAL:  Chronically ill-appearing.  Frail.  HEENT: Pupils equal round and reactive, fundi not visualized, oral mucosa unremarkable NECK:  No jugular venous distention, waveform within normal limits, carotid upstroke brisk and symmetric, no bruits, no thyromegaly LUNGS:  Bilateral rhonchi.  +productive cough HEART:  Tachycardic.  Irregularly irregular.  PMI not displaced or sustained,S1 and S2 within normal limits, no S3, no S4, no clicks, no rubs, no murmurs ABD:  Flat, positive bowel sounds normal in frequency in pitch, no bruits, no rebound, no guarding, no midline pulsatile mass, no hepatomegaly, no splenomegaly EXT:  2 plus pulses throughout, no edema, no cyanosis no clubbing SKIN:  No rashes no  nodules NEURO:  Cranial nerves II through XII grossly intact, motor grossly intact throughout PSYCH:  Cognitively intact, oriented to person place and time   Assessment & Plan .     # Persistent afib with RVR:  She has a history of atrial fibrillation.  Now with rapid ventricular response in the setting of UTI.  Plan for DCCV today.  She has not missed any doses of her Xarelto.  Informed Consent   Shared Decision Making/Informed Consent The risks (stroke, cardiac arrhythmias rarely resulting in the need for a temporary or permanent pacemaker, skin irritation or burns and complications associated with conscious sedation including aspiration, arrhythmia, respiratory failure and death), benefits (restoration of normal sinus rhythm) and alternatives of a direct current cardioversion were explained in detail to Carrie Case and she agrees to proceed.   # Acute on chronic HFpEF:  Echo this admission revealed LVEF 55-60%.  Mild LVH.  She has moderate mitral annular calcification.  Right atrial pressure was 3 mmHg.  BNP was 746.  Her lungs have more rhonchi than crackles.  Blood pressure has been too low for diuresis thus far.  Will attempt gentle diuresis after cardioversion if BP  allows.  # Hyperlipidemia: # Carotid stenosis: # PAD:  Continue rosuvastatin and Xarelto.  # CAP: # COPD: Management per primary team.   # Tobacco abuse;  Continue to encourage smoking cessation.   For questions or updates, please contact [facility] Please consult [email or web address] for contact info under        Signed, Chilton Si, MD

## 2023-06-24 NOTE — Anesthesia Postprocedure Evaluation (Signed)
Anesthesia Post Note  Patient: Carrie Case  Procedure(s) Performed: CARDIOVERSION (CATH LAB)     Patient location during evaluation: PACU Anesthesia Type: General Level of consciousness: awake and alert Pain management: pain level controlled Vital Signs Assessment: post-procedure vital signs reviewed and stable Respiratory status: spontaneous breathing, nonlabored ventilation, respiratory function stable and patient connected to nasal cannula oxygen Cardiovascular status: blood pressure returned to baseline and stable Postop Assessment: no apparent nausea or vomiting Anesthetic complications: no   No notable events documented.  Last Vitals:  Vitals:   06/24/23 1124 06/24/23 1308  BP: 129/76 113/88  Pulse:    Resp: 19 19  Temp: 36.6 C   SpO2:      Last Pain:  Vitals:   06/24/23 1427  TempSrc:   PainSc: 5                  Barth Trella

## 2023-06-24 NOTE — Anesthesia Preprocedure Evaluation (Addendum)
Anesthesia Evaluation  Patient identified by MRN, date of birth, ID band Patient awake    Reviewed: Allergy & Precautions, H&P , NPO status , Patient's Chart, lab work & pertinent test results  Airway Mallampati: II  TM Distance: >3 FB Neck ROM: Full    Dental no notable dental hx. (+) Edentulous Upper, Edentulous Lower, Upper Dentures,    Pulmonary COPD, Current Smoker and Patient abstained from smoking.   Pulmonary exam normal breath sounds clear to auscultation       Cardiovascular Exercise Tolerance: Good Pt. on home beta blockers + Peripheral Vascular Disease and +CHF  Normal cardiovascular exam Rhythm:Regular Rate:Normal  Echo 24' 1. Left ventricular ejection fraction, by estimation, is 55 to 60%. The  left ventricle has normal function. The left ventricle has no regional  wall motion abnormalities. There is mild left ventricular hypertrophy.  Left ventricular diastolic function  could not be evaluated.   2. Right ventricular systolic function is normal. The right ventricular  size is normal. There is moderately elevated pulmonary artery systolic  pressure.   3. Left atrial size was moderately dilated.   4. Right atrial size was moderately dilated.   5. The mitral valve is normal in structure. Trivial mitral valve  regurgitation. No evidence of mitral stenosis. Moderate mitral annular  calcification.   6. The aortic valve is tricuspid. Aortic valve regurgitation is mild to  moderate. Aortic valve sclerosis/calcification is present, without any  evidence of aortic stenosis.   7. The inferior vena cava is normal in size with greater than 50%  respiratory variability, suggesting right atrial pressure of 3 mmHg.     Neuro/Psych  PSYCHIATRIC DISORDERS  Depression    negative neurological ROS  negative psych ROS   GI/Hepatic negative GI ROS, Neg liver ROS,,,  Endo/Other  negative endocrine ROS    Renal/GU negative  Renal ROS  negative genitourinary   Musculoskeletal negative musculoskeletal ROS (+)    Abdominal   Peds negative pediatric ROS (+)  Hematology negative hematology ROS (+)   Anesthesia Other Findings   Reproductive/Obstetrics negative OB ROS                             Anesthesia Physical Anesthesia Plan  ASA: 4  Anesthesia Plan: General   Post-op Pain Management: Minimal or no pain anticipated   Induction: Intravenous  PONV Risk Score and Plan: 3 and Propofol infusion  Airway Management Planned: Natural Airway, Simple Face Mask and Mask  Additional Equipment:   Intra-op Plan:   Post-operative Plan:   Informed Consent: I have reviewed the patients History and Physical, chart, labs and discussed the procedure including the risks, benefits and alternatives for the proposed anesthesia with the patient or authorized representative who has indicated his/her understanding and acceptance.       Plan Discussed with: CRNA and Anesthesiologist  Anesthesia Plan Comments: ( )        Anesthesia Quick Evaluation

## 2023-06-24 NOTE — Interval H&P Note (Signed)
History and Physical Interval Note:  06/24/2023 10:16 AM  Carrie Case  has presented today for surgery, with the diagnosis of afib.  The various methods of treatment have been discussed with the patient and family. After consideration of risks, benefits and other options for treatment, the patient has consented to  Procedure(s): CARDIOVERSION (CATH LAB) (N/A) as a surgical intervention.  The patient's history has been reviewed, patient examined, no change in status, stable for surgery.  I have reviewed the patient's chart and labs.  Questions were answered to the patient's satisfaction.     Carrie Case

## 2023-06-24 NOTE — TOC CM/SW Note (Signed)
Transition of Care [facility]) - Inpatient Brief Assessment   Patient Details  Name: Carrie Case MRN: [id number] Date of Birth: 1956-04-25  Transition of Care [facility]) CM/SW Contact:    Harriet Masson, RN Phone Number: 06/24/2023, 12:24 PM   Clinical Narrative:  Patient admitted for atrial fibrillation.  No TOC needs at this time.   Transition of Care Asessment: Insurance and Status: Insurance coverage has been reviewed Patient has primary care physician: Yes Home environment has been reviewed: safe to discharge home when medically stable Prior level of function:: independent Prior/Current Home Services: No current home services Social Determinants of Health Reivew: SDOH reviewed no interventions necessary Readmission risk has been reviewed: Yes Transition of care needs: no transition of care needs at this time

## 2023-06-24 NOTE — Plan of Care (Signed)
  Problem: Education: Goal: Knowledge of General Education information will improve Description: Including pain rating scale, medication(s)/side effects and non-pharmacologic comfort measures Outcome: Progressing   Problem: Health Behavior/Discharge Planning: Goal: Ability to manage health-related needs will improve Outcome: Progressing   Problem: Clinical Measurements: Goal: Ability to maintain clinical measurements within normal limits will improve Outcome: Progressing Goal: Will remain free from infection Outcome: Progressing Goal: Diagnostic test results will improve Outcome: Progressing Goal: Respiratory complications will improve Outcome: Progressing Goal: Cardiovascular complication will be avoided Outcome: Progressing   Problem: Activity: Goal: Risk for activity intolerance will decrease Outcome: Progressing   Problem: Nutrition: Goal: Adequate nutrition will be maintained Outcome: Progressing   Problem: Coping: Goal: Level of anxiety will decrease Outcome: Progressing   Problem: Elimination: Goal: Will not experience complications related to bowel motility Outcome: Progressing Goal: Will not experience complications related to urinary retention Outcome: Progressing   Problem: Pain Management: Goal: General experience of comfort will improve Outcome: Progressing   Problem: Safety: Goal: Ability to remain free from injury will improve Outcome: Progressing   Problem: Skin Integrity: Goal: Risk for impaired skin integrity will decrease Outcome: Progressing   Problem: Education: Goal: Ability to demonstrate management of disease process will improve Outcome: Progressing Goal: Ability to verbalize understanding of medication therapies will improve Outcome: Progressing Goal: Individualized Educational Video(s) Outcome: Progressing   Problem: Activity: Goal: Capacity to carry out activities will improve Outcome: Progressing   Problem: Cardiac: Goal:  Ability to achieve and maintain adequate cardiopulmonary perfusion will improve Outcome: Progressing   Problem: Education: Goal: Ability to demonstrate management of disease process will improve Outcome: Progressing Goal: Ability to verbalize understanding of medication therapies will improve Outcome: Progressing Goal: Individualized Educational Video(s) Outcome: Progressing   Problem: Activity: Goal: Capacity to carry out activities will improve Outcome: Progressing   Problem: Cardiac: Goal: Ability to achieve and maintain adequate cardiopulmonary perfusion will improve Outcome: Progressing

## 2023-06-24 NOTE — Transfer of Care (Signed)
Immediate Anesthesia Transfer of Care Note  Patient: Carrie Case  Procedure(s) Performed: CARDIOVERSION (CATH LAB)  Patient Location: PACU  Anesthesia Type:General  Level of Consciousness: drowsy  Airway & Oxygen Therapy: Patient Spontanous Breathing and Patient connected to nasal cannula oxygen  Post-op Assessment: Report given to RN and Post -op Vital signs reviewed and stable  Post vital signs: Reviewed and stable  Last Vitals:  Vitals Value Taken Time  BP    Temp    Pulse    Resp    SpO2      Last Pain:  Vitals:   06/24/23 0920  TempSrc:   PainSc: 0-No pain         Complications: No notable events documented.

## 2023-06-25 ENCOUNTER — Encounter (HOSPITAL_COMMUNITY): Payer: Self-pay | Admitting: Cardiology

## 2023-06-25 DIAGNOSIS — E876 Hypokalemia: Secondary | ICD-10-CM | POA: Diagnosis not present

## 2023-06-25 DIAGNOSIS — I4891 Unspecified atrial fibrillation: Secondary | ICD-10-CM | POA: Diagnosis not present

## 2023-06-25 DIAGNOSIS — J449 Chronic obstructive pulmonary disease, unspecified: Secondary | ICD-10-CM | POA: Diagnosis not present

## 2023-06-25 DIAGNOSIS — I5033 Acute on chronic diastolic (congestive) heart failure: Secondary | ICD-10-CM | POA: Diagnosis not present

## 2023-06-25 DIAGNOSIS — E440 Moderate protein-calorie malnutrition: Secondary | ICD-10-CM

## 2023-06-25 LAB — CBC
HCT: 43 % (ref 36.0–46.0)
Hemoglobin: 14.8 g/dL (ref 12.0–15.0)
MCH: 32.5 pg (ref 26.0–34.0)
MCHC: 34.4 g/dL (ref 30.0–36.0)
MCV: 94.3 fL (ref 80.0–100.0)
Platelets: 370 10*3/uL (ref 150–400)
RBC: 4.56 MIL/uL (ref 3.87–5.11)
RDW: 14.6 % (ref 11.5–15.5)
WBC: 15.4 10*3/uL — ABNORMAL HIGH (ref 4.0–10.5)
nRBC: 0 % (ref 0.0–0.2)

## 2023-06-25 LAB — BASIC METABOLIC PANEL
Anion gap: 8 (ref 5–15)
BUN: 16 mg/dL (ref 8–23)
CO2: 26 mmol/L (ref 22–32)
Calcium: 9.9 mg/dL (ref 8.9–10.3)
Chloride: 97 mmol/L — ABNORMAL LOW (ref 98–111)
Creatinine, Ser: 1.15 mg/dL — ABNORMAL HIGH (ref 0.44–1.00)
GFR, Estimated: 53 mL/min — ABNORMAL LOW (ref >60)
Glucose, Bld: 92 mg/dL (ref 70–99)
Potassium: 4.5 mmol/L (ref 3.5–5.1)
Sodium: 131 mmol/L — ABNORMAL LOW (ref 135–145)

## 2023-06-25 LAB — MAGNESIUM: Magnesium: 2 mg/dL (ref 1.7–2.4)

## 2023-06-25 LAB — PHOSPHORUS: Phosphorus: 1.9 mg/dL — ABNORMAL LOW (ref 2.5–4.6)

## 2023-06-25 MED ORDER — DIGOXIN 0.25 MG/ML IJ SOLN
0.1250 mg | Freq: Three times a day (TID) | INTRAMUSCULAR | Status: AC
  Administered 2023-06-25 (×2): 0.125 mg via INTRAVENOUS
  Filled 2023-06-25 (×2): qty 0.5

## 2023-06-25 MED ORDER — DIGOXIN 0.25 MG/ML IJ SOLN
0.2500 mg | Freq: Once | INTRAMUSCULAR | Status: AC
  Administered 2023-06-25: 0.25 mg via INTRAVENOUS
  Filled 2023-06-25: qty 1

## 2023-06-25 NOTE — Assessment & Plan Note (Signed)
Continue with nutritional supplements.  

## 2023-06-25 NOTE — Care Management Important Message (Signed)
Important Message  Patient Details  Name: Carrie Case MRN: [id number] Date of Birth: Jan 08, 1956   Important Message Given:  Yes - Medicare IM     Dorena Bodo 06/25/2023, 3:47 PM

## 2023-06-25 NOTE — Progress Notes (Signed)
Patient Name: Carrie Case Date of Encounter: 06/25/2023 [facility] Cardiologist: Dina Rich, MD   Interval Summary  .    Feeling tired.  Denies palpitations. Tired of being in the bed. Back hurts.  Vital Signs .    Vitals:   06/24/23 1609 06/24/23 1943 06/24/23 2330 06/25/23 0522  BP:  102/79 120/80 (!) 107/92  Pulse:  (!) 127 (!) 130 (!) 126  Resp: 19 18 19 15   Temp:  97.6 F (36.4 C) (!) 97.5 F (36.4 C) 97.6 F (36.4 C)  TempSrc:  Axillary Oral Oral  SpO2:  96% 92% 90%  Weight:    53.1 kg  Height:        Intake/Output Summary (Last 24 hours) at 06/25/2023 0734 Last data filed at 06/25/2023 0200 Gross per 24 hour  Intake 489.37 ml  Output 400 ml  Net 89.37 ml      06/25/2023    5:22 AM 06/24/2023    5:00 AM 06/23/2023    3:59 AM  Last 3 Weights  Weight (lbs) 117 lb 118 lb 4.8 oz 117 lb 12.8 oz  Weight (kg) 53.071 kg 53.661 kg 53.434 kg      Telemetry/ECG    Atrial fibrillation with RVR.  - Personally Reviewed  Physical Exam .    VS:  BP (!) 107/92 (BP Location: Left Arm)   Pulse (!) 126   Temp 97.6 F (36.4 C) (Oral)   Resp 15   Ht 5\' 6"  (1.676 m)   Wt 53.1 kg Comment: Scale A  SpO2 90%   BMI 18.88 kg/m  , BMI Body mass index is 18.88 kg/m. GENERAL:  Chronically ill-appearing.  Frail.  HEENT: Pupils equal round and reactive, fundi not visualized, oral mucosa unremarkable NECK:  No jugular venous distention, waveform within normal limits, carotid upstroke brisk and symmetric, no bruits, no thyromegaly LUNGS:  Bilateral rhonchi.  +productive cough HEART:  Tachycardic.  Irregularly irregular.  PMI not displaced or sustained,S1 and S2 within normal limits, no S3, no S4, no clicks, no rubs, no murmurs ABD:  Flat, positive bowel sounds normal in frequency in pitch, no bruits, no rebound, no guarding, no midline pulsatile mass, no hepatomegaly, no splenomegaly EXT:  2 plus pulses throughout, no edema, no cyanosis no clubbing SKIN:  No  rashes no nodules NEURO:  Cranial nerves II through XII grossly intact, motor grossly intact throughout PSYCH:  Cognitively intact, oriented to person place and time   Assessment & Plan .     # Persistent afib with RVR:  She has a history of atrial fibrillation.  Now with rapid ventricular response in the setting of CAP.  She underwent successful DCCV on 11/4 but went back into atrial fibrillation.  Rates remain uncontrolled.  She is on IV amiodarone.  Blood pressures are too low to add any nodal agents.  Will add digoxin 0.25 milligrams IV x 1 followed by 0.125 mg every 8 hours for 2 doses to load.  If this works well, we will transition to oral dosing tomorrow.  I suspect that she would not be able to maintain sinus rhythm until she is over her infection.  Plan to see her in clinic and schedule cardioversion in a couple weeks.  Continue Xarelto.  Resume metoprolol when blood pressure will allow.    # Acute on chronic HFpEF:  Echo this admission revealed LVEF 55-60%.  Mild LVH.  She has moderate mitral annular calcification.  Right atrial pressure was 3 mmHg.  BNP was 746.  Her lungs have more rhonchi than crackles.  Blood pressure has been too low for diuresis thus far.  Will attempt gentle diuresis after cardioversion if BP allows.  She appears to be euvolemic on exam.  # Hyperlipidemia: # Carotid stenosis: # PAD:  Continue rosuvastatin and Xarelto.  # CAP: # COPD: Management per primary team.   # Tobacco abuse;  Continue to encourage smoking cessation.   For questions or updates, please contact [facility] Please consult [email or web address] for contact info under        Signed, Chilton Si, MD

## 2023-06-25 NOTE — Progress Notes (Addendum)
Progress Note   Patient: Carrie Case GUY:[id number] DOB: 01-23-1956 DOA: 06/22/2023     3 DOS: the patient was seen and examined on 06/25/2023   Brief hospital course: Carrie Case was admitted to the hospital with the working diagnosis of atrial fibrillation and heart failure in the setting of community acquired pneumonia.   67 yo female with the past medical history of atrial fibrillation, COPD, carotid artery stenosis, peripheral vascular disease and dyslipidemia who presented with dyspnea. Reported dyspnea for 2 days, worsening symptoms prompting her to call EMS. She was found with heart rate in the 200's and transported to [facility] ED, where she was found in heart failure decompensation. She was transferred to [facility] for further medical therapy. At the time of her transfer her blood pressure was 92/72, HR 147, RR25 and 02 saturation 96%, lungs with no wheezing or rhonchi, heart with S1 and S2 present, irregularly irregular, abdomen with no distention and no lower extremity edema.   Na 136, K 3,7 Cl 104, bicarbonate 22, glucose 112, bun 18, cr 1,0  AST 109, ALT 52,  BNP 746  Wbc 14.7 hgb 13,7 plt 354  TSH 0,9   EKG 138 bpm, normal axis, normal intervals, atrial fibrillation rhythm with no significant ST segment or T wave changes.   Patient was placed on Amiodarone drip and received furosemide.  Started on antibiotic therapy for community acquired pneumonia.  11/04 direct current cardioversion. 11/05 patient is back in atrial fibrillation with  RVR.   Assessment and Plan: * Atrial fibrillation with rapid ventricular response (HCC) Patient continue in atrial fibrillation/ atrial flutter rhythm with rate up to 120 to 130 bpm.  Systolic blood pressure 98 to 563 mmHg.   Digoxin added today for 2 doses.  Continue amiodarone drip for rate control and anticoagulation with rivaroxaban. As needed IV metoprolol.   Acute on chronic diastolic CHF (congestive heart failure)  (HCC) Echocardiogram with preserved LV systolic function with EF 55 to 60%, mid left ventricular hypertrophy, RV systolic function preserved, RVSP 46.6, LA and RA with moderate dilatation, mild to moderate aortic regurgitation.   Volume status has improved.  Continue to hold on loop diuretic therapy. Continue rate control atrial fibrillation.   Acute hypoxemic respiratory failure due to cardiogenic pulmonary edema and pneumonia.  Continue supplemental 02 per [place], likely component of COPD.    COPD  GOLD 2 / still smoking  No clinical signs of acute exacerbation.  Oxymetry is 95% on 3 L/min pre [place].  Continue bronchodilator therapy.   Chest radiograph from [facility] with signs of pulmonary edema and left lung base atelectasis.  CT chest with patchy nodular tree in bud infiltrates in both lungs with more dense consolidation in the lower lungs.  Positive emphysematous changes in the upper lungs.  11/03  chest radiograph with hyperinflation, increased lung marking. Positive interstitial infiltrates at bases bilaterally, no effusions.   Community acquired pneumonia  Blood cultures with no growth.  PO azithromycin and IV ceftriaxone.  Hold on systemic corticosteroids for now.  Incentive spirometer and flutter valve,  Out of bed to chair tid with meals.  Consult nutrition.   Hypokalemia Hyponatremia.  Renal function with serum cr at 1,15 with K at 4,5 and serum bicarbonate at 26. Na 131. Mg 2.0   Continue close follow up renal function and electrolytes.  Continue to hold on loop diuretic therapy.  Hyperlipidemia Continue with statin therapy.   Malnutrition of moderate degree Continue with nutritional supplements.  Subjective: Patient with no chest pain or palpitations, at rest with no dyspnea, denies orthopnea or PND/   Physical Exam: Vitals:   06/25/23 0522 06/25/23 0750 06/25/23 0758 06/25/23 0800  BP: (!) 107/92 98/83    Pulse: (!) 126 (!) 145    Resp: 15 20    Temp: 97.6  F (36.4 C) (!) 97.5 F (36.4 C)    TempSrc: Oral Oral    SpO2: 90% 93% 94% 94%  Weight: 53.1 kg     Height:       Neurology awake and alert ENT with mild pallor Cardiovascular with S1 and S2 present, irregularly irregular with no gallops, or rubs, no murmurs No JVD No lower extremity edema Respiratory with prolonged expiratory phase and decreased breath sounds, positive rales at bases. No wheezing Abdomen with no distention  Data Reviewed:    Family Communication: no family at the bedside. I spoke with patient's daughter over the phone, we talked in detail about patient's condition, plan of care and prognosis and all questions were addressed.   Disposition: Status is: Inpatient Remains inpatient appropriate because: atrial fibrillation antiarrhythmic treatment   Planned Discharge Destination: Home  Author: Coralie Keens, MD 06/25/2023 8:48 AM  For on call review [email or web address].

## 2023-06-25 NOTE — Plan of Care (Signed)
?  Problem: Clinical Measurements: Goal: Will remain free from infection Outcome: Progressing   Problem: Activity: Goal: Risk for activity intolerance will decrease Outcome: Progressing   Problem: Nutrition: Goal: Adequate nutrition will be maintained Outcome: Progressing   Problem: Coping: Goal: Level of anxiety will decrease Outcome: Progressing   Problem: Elimination: Goal: Will not experience complications related to bowel motility Outcome: Progressing Goal: Will not experience complications related to urinary retention Outcome: Progressing   

## 2023-06-25 NOTE — Progress Notes (Incomplete)
PROGRESS NOTE    Carrie Case  WUJ:[id number] DOB: 1956-04-12 DOA: 06/22/2023 PCP: Billie Lade, MD  67/F w A fib, COPD, carotid artery stenosis, peripheral vascular disease and dyslipidemia who presented with dyspnea.x 2 days, went to [facility] ED, noted to be in A-fib RVR with decompensated heart failure Labs- cr 1,0, AST 109, ALT 52, BNP 746 , hgb 13,7  -placed on Amiodarone drip and received furosemide.  Started on antibiotic therapy for community acquired pneumonia.  11/04 direct current cardioversion. 11/05 patient is back in atrial fibrillation with  RVR.    Subjective:   Assessment and Plan:  Atrial fibrillation with rapid ventricular response (HCC) -sp DCCV 11/4 -back in rapid A-fib -Cardiology following, given digoxin X2 doses today -Continue amiodarone gtt, rivaroxaban -Plan to resume metoprolol when blood pressure tolerates  Acute on chronic diastolic CHF (congestive heart failure) (HCC) -Echo with EF 55 to 60%, mild LVH, RV preserved,  mild to moderate aortic regurgitation.  -Diuresed with IV Lasix, volume status has improved -Diuretics on hold now   Acute hypoxemic respiratory failure due to cardiogenic pulmonary edema and pneumonia.  Continue supplemental 02 per [place], likely component of COPD.    COPD  GOLD 2 / still smoking  No clinical signs of acute exacerbation.  CT chest with patchy nodular tree in bud infiltrates in both lungs with more dense consolidation in the lower lungs.  Positive emphysematous changes in the upper lungs. -11/03  chest radiograph with hyperinflation, increased lung marking. Positive interstitial infiltrates at bases bilaterally  Community acquired pneumonia  Blood cultures with no growth.  PO azithromycin and IV ceftriaxone.  Hold on systemic corticosteroids for now.  Incentive spirometer and flutter valve,  -Increase activity, out of bed to chair  Hypokalemia Hyponatremia. -Monitor  Hyperlipidemia Continue with  statin therapy.   Malnutrition of moderate degree Continue with nutritional supplements.   DVT prophylaxis: xarelto Code Status: DNR Family Communication: Disposition Plan:   Consultants: Cards   Procedures:   Antimicrobials:    Objective: Vitals:   06/25/23 0750 06/25/23 0758 06/25/23 0800 06/25/23 1144  BP: 98/83   112/86  Pulse: (!) 145   (!) 115  Resp: 20   (!) 22  Temp: (!) 97.5 F (36.4 C)   97.6 F (36.4 C)  TempSrc: Oral   Oral  SpO2: 93% 94% 94% 93%  Weight:      Height:        Intake/Output Summary (Last 24 hours) at 06/25/2023 1407 Last data filed at 06/25/2023 0850 Gross per 24 hour  Intake 601.95 ml  Output 400 ml  Net 201.95 ml   Filed Weights   06/23/23 0359 06/24/23 0500 06/25/23 0522  Weight: 53.4 kg 53.7 kg 53.1 kg    Examination:    Data Reviewed:   CBC: Recent Labs  Lab 06/22/23 1154 06/23/23 0234 06/24/23 0221 06/25/23 0238  WBC 14.7* 16.8* 15.5* 15.4*  NEUTROABS 12.0*  --   --   --   HGB 13.7 14.8 14.3 14.8  HCT 39.7 42.5 41.5 43.0  MCV 95.7 95.1 94.7 94.3  PLT 354 397 409* 370   Basic Metabolic Panel: Recent Labs  Lab 06/22/23 1154 06/23/23 0234 06/23/23 1806 06/24/23 0221 06/25/23 0238  NA 136 135 133* 134* 131*  K 3.7 2.9* 4.1 4.0 4.5  CL 104 97* 99 97* 97*  CO2 22 26 23 25 26   GLUCOSE 112* 109* 121* 97 92  BUN 18 18 19 17 16   CREATININE 1.09* 1.17*  1.10* 1.14* 1.15*  CALCIUM 10.1 10.5* 10.4* 10.5* 9.9  MG 2.3  --   --   --  2.0  PHOS 2.7  --   --   --  1.9*   GFR: Estimated Creatinine Clearance: 40.3 mL/min (A) (by C-G formula based on SCr of 1.15 mg/dL (H)). Liver Function Tests: Recent Labs  Lab 06/22/23 1154  AST 109*  ALT 52*  ALKPHOS 122  BILITOT 0.6  PROT 6.9  ALBUMIN 2.2*   No results for input(s): "LIPASE", "AMYLASE" in the last 168 hours. No results for input(s): "AMMONIA" in the last 168 hours. Coagulation Profile: Recent Labs  Lab 06/23/23 0234  INR 3.5*   Cardiac Enzymes: No  results for input(s): "CKTOTAL", "CKMB", "CKMBINDEX", "TROPONINI" in the last 168 hours. BNP (last 3 results) No results for input(s): "PROBNP" in the last 8760 hours. HbA1C: No results for input(s): "HGBA1C" in the last 72 hours. CBG: No results for input(s): "GLUCAP" in the last 168 hours. Lipid Profile: No results for input(s): "CHOL", "HDL", "LDLCALC", "TRIG", "CHOLHDL", "LDLDIRECT" in the last 72 hours. Thyroid Function Tests: No results for input(s): "TSH", "T4TOTAL", "FREET4", "T3FREE", "THYROIDAB" in the last 72 hours. Anemia Panel: No results for input(s): "VITAMINB12", "FOLATE", "FERRITIN", "TIBC", "IRON", "RETICCTPCT" in the last 72 hours. Urine analysis: No results found for: "COLORURINE", "APPEARANCEUR", "LABSPEC", "PHURINE", "GLUCOSEU", "HGBUR", "BILIRUBINUR", "KETONESUR", "PROTEINUR", "UROBILINOGEN", "NITRITE", "LEUKOCYTESUR" Sepsis Labs: @LABRCNTIP (procalcitonin:4,lacticidven:4)  ) Recent Results (from the past 240 hour(s))  MRSA Next Gen by PCR, Nasal     Status: Abnormal   Collection Time: 06/22/23 10:04 AM   Specimen: Nasal Mucosa; Nasal Swab  Result Value Ref Range Status   MRSA by PCR Next Gen DETECTED (A) NOT DETECTED Final    Comment: RESULT CALLED TO, READ BACK BY AND VERIFIED WITH: RN [facility] ON [id number] @1220H  BY SM (NOTE) The GeneXpert MRSA Assay (FDA approved for NASAL specimens only), is one component of a comprehensive MRSA colonization surveillance program. It is not intended to diagnose MRSA infection nor to guide or monitor treatment for MRSA infections. Test performance is not FDA approved in patients less than 22 years old. Performed at [facility], [address] [place], [place] [address]   Culture, blood (Routine X 2) w Reflex to ID Panel     Status: None (Preliminary result)   Collection Time: 06/23/23 11:32 AM   Specimen: BLOOD RIGHT HAND  Result Value Ref Range Status   Specimen Description BLOOD RIGHT HAND  Final   Special  Requests   Final    BOTTLES DRAWN AEROBIC AND ANAEROBIC Blood Culture adequate volume   Culture   Final    NO GROWTH 1 DAY Performed at [facility], [address] [place], [place] [address]    Report Status PENDING  Incomplete  Culture, blood (Routine X 2) w Reflex to ID Panel     Status: None (Preliminary result)   Collection Time: 06/23/23 11:33 AM   Specimen: BLOOD RIGHT HAND  Result Value Ref Range Status   Specimen Description BLOOD RIGHT HAND  Final   Special Requests   Final    BOTTLES DRAWN AEROBIC AND ANAEROBIC Blood Culture adequate volume   Culture   Final    NO GROWTH 1 DAY Performed at [facility], [address] [place], [place] [address]    Report Status PENDING  Incomplete     Radiology Studies: No results found.   Scheduled Meds:  arformoterol  15 mcg  Nebulization BID   azithromycin  500 mg Oral Daily   Chlorhexidine Gluconate Cloth  6 each Topical Q0600   digoxin  0.125 mg Intravenous Q8H   multivitamin with minerals  1 tablet Oral Daily   mupirocin ointment  1 Application Nasal BID   pantoprazole  40 mg Oral Daily   rivaroxaban  15 mg Oral QAC supper   rosuvastatin  20 mg Oral Daily   umeclidinium bromide  1 puff Inhalation Daily   Continuous Infusions:  amiodarone 30 mg/hr (06/25/23 1128)   cefTRIAXone (ROCEPHIN)  IV 1 g (06/25/23 1126)     LOS: 3 days    Time spent:    Zannie Cove, MD [facility]   06/25/2023, 2:07 PM

## 2023-06-25 NOTE — Plan of Care (Signed)
  Problem: Education: Goal: Knowledge of General Education information will improve Description: Including pain rating scale, medication(s)/side effects and non-pharmacologic comfort measures Outcome: Progressing   Problem: Health Behavior/Discharge Planning: Goal: Ability to manage health-related needs will improve Outcome: Progressing   Problem: Clinical Measurements: Goal: Ability to maintain clinical measurements within normal limits will improve Outcome: Progressing Goal: Will remain free from infection Outcome: Progressing Goal: Diagnostic test results will improve Outcome: Progressing Goal: Respiratory complications will improve Outcome: Progressing Goal: Cardiovascular complication will be avoided Outcome: Progressing   Problem: Activity: Goal: Risk for activity intolerance will decrease Outcome: Progressing   Problem: Nutrition: Goal: Adequate nutrition will be maintained Outcome: Progressing   Problem: Coping: Goal: Level of anxiety will decrease Outcome: Progressing   Problem: Elimination: Goal: Will not experience complications related to bowel motility Outcome: Progressing Goal: Will not experience complications related to urinary retention Outcome: Progressing   Problem: Pain Management: Goal: General experience of comfort will improve Outcome: Progressing   Problem: Safety: Goal: Ability to remain free from injury will improve Outcome: Progressing   Problem: Skin Integrity: Goal: Risk for impaired skin integrity will decrease Outcome: Progressing   Problem: Education: Goal: Ability to demonstrate management of disease process will improve Outcome: Progressing Goal: Ability to verbalize understanding of medication therapies will improve Outcome: Progressing Goal: Individualized Educational Video(s) Outcome: Progressing   Problem: Activity: Goal: Capacity to carry out activities will improve Outcome: Progressing   Problem: Cardiac: Goal:  Ability to achieve and maintain adequate cardiopulmonary perfusion will improve Outcome: Progressing   Problem: Education: Goal: Ability to demonstrate management of disease process will improve Outcome: Progressing Goal: Ability to verbalize understanding of medication therapies will improve Outcome: Progressing Goal: Individualized Educational Video(s) Outcome: Progressing   Problem: Activity: Goal: Capacity to carry out activities will improve Outcome: Progressing   Problem: Cardiac: Goal: Ability to achieve and maintain adequate cardiopulmonary perfusion will improve Outcome: Progressing

## 2023-06-25 NOTE — Progress Notes (Signed)
Patient has been weaned to room air overnight O2 remaining 92-95%.

## 2023-06-26 DIAGNOSIS — I4891 Unspecified atrial fibrillation: Secondary | ICD-10-CM | POA: Diagnosis not present

## 2023-06-26 DIAGNOSIS — I5033 Acute on chronic diastolic (congestive) heart failure: Secondary | ICD-10-CM | POA: Diagnosis not present

## 2023-06-26 LAB — CBC
HCT: 45.7 % (ref 36.0–46.0)
Hemoglobin: 15.8 g/dL — ABNORMAL HIGH (ref 12.0–15.0)
MCH: 32.8 pg (ref 26.0–34.0)
MCHC: 34.6 g/dL (ref 30.0–36.0)
MCV: 94.8 fL (ref 80.0–100.0)
Platelets: 392 10*3/uL (ref 150–400)
RBC: 4.82 MIL/uL (ref 3.87–5.11)
RDW: 14.5 % (ref 11.5–15.5)
WBC: 17 10*3/uL — ABNORMAL HIGH (ref 4.0–10.5)
nRBC: 0 % (ref 0.0–0.2)

## 2023-06-26 LAB — BASIC METABOLIC PANEL
Anion gap: 15 (ref 5–15)
BUN: 15 mg/dL (ref 8–23)
CO2: 23 mmol/L (ref 22–32)
Calcium: 10.1 mg/dL (ref 8.9–10.3)
Chloride: 95 mmol/L — ABNORMAL LOW (ref 98–111)
Creatinine, Ser: 1.1 mg/dL — ABNORMAL HIGH (ref 0.44–1.00)
GFR, Estimated: 55 mL/min — ABNORMAL LOW (ref >60)
Glucose, Bld: 99 mg/dL (ref 70–99)
Potassium: 3.8 mmol/L (ref 3.5–5.1)
Sodium: 133 mmol/L — ABNORMAL LOW (ref 135–145)

## 2023-06-26 LAB — MAGNESIUM: Magnesium: 1.9 mg/dL (ref 1.7–2.4)

## 2023-06-26 LAB — PHOSPHORUS: Phosphorus: 2.5 mg/dL (ref 2.5–4.6)

## 2023-06-26 MED ORDER — DIGOXIN 125 MCG PO TABS
0.1250 mg | ORAL_TABLET | Freq: Every day | ORAL | Status: DC
  Administered 2023-06-26 – 2023-06-27 (×2): 0.125 mg via ORAL
  Filled 2023-06-26 (×2): qty 1

## 2023-06-26 MED ORDER — POTASSIUM CHLORIDE 20 MEQ PO PACK
20.0000 meq | PACK | Freq: Once | ORAL | Status: AC
  Administered 2023-06-26: 20 meq via ORAL
  Filled 2023-06-26: qty 1

## 2023-06-26 MED ORDER — METOPROLOL SUCCINATE ER 25 MG PO TB24
25.0000 mg | ORAL_TABLET | Freq: Every day | ORAL | Status: DC
  Administered 2023-06-26 – 2023-06-27 (×2): 25 mg via ORAL
  Filled 2023-06-26 (×2): qty 1

## 2023-06-26 MED ORDER — CYCLOBENZAPRINE HCL 10 MG PO TABS
5.0000 mg | ORAL_TABLET | Freq: Three times a day (TID) | ORAL | Status: DC | PRN
Start: 2023-06-26 — End: 2023-06-27

## 2023-06-26 NOTE — Plan of Care (Signed)
  Problem: Activity: Goal: Risk for activity intolerance will decrease Outcome: Progressing   Problem: Nutrition: Goal: Adequate nutrition will be maintained Outcome: Progressing   Problem: Coping: Goal: Level of anxiety will decrease Outcome: Progressing   Problem: Elimination: Goal: Will not experience complications related to bowel motility Outcome: Progressing Goal: Will not experience complications related to urinary retention Outcome: Progressing   Problem: Pain Management: Goal: General experience of comfort will improve Outcome: Progressing   Problem: Safety: Goal: Ability to remain free from injury will improve Outcome: Progressing

## 2023-06-26 NOTE — Progress Notes (Signed)
Patient Name: Carrie Case Date of Encounter: 06/26/2023 [facility] Cardiologist: Dina Rich, MD   Interval Summary  .    Breathing is stable.  Denies palpitations.   Vital Signs .    Vitals:   06/26/23 0716 06/26/23 0901 06/26/23 1100 06/26/23 1141  BP:  117/88 99/75   Pulse:  (!) 109 97 (!) 103  Resp:   (!) 23   Temp:   97.9 F (36.6 C)   TempSrc:   Oral   SpO2: 94%  92%   Weight:      Height:        Intake/Output Summary (Last 24 hours) at 06/26/2023 1259 Last data filed at 06/26/2023 1143 Gross per 24 hour  Intake 907.83 ml  Output 500 ml  Net 407.83 ml      06/26/2023    5:17 AM 06/25/2023    5:22 AM 06/24/2023    5:00 AM  Last 3 Weights  Weight (lbs) 118 lb 3.2 oz 117 lb 118 lb 4.8 oz  Weight (kg) 53.615 kg 53.071 kg 53.661 kg      Telemetry/ECG    Atrial fibrillation with RVR.  - Personally Reviewed  Physical Exam .    VS:  BP 99/75 (BP Location: Left Arm)   Pulse (!) 103   Temp 97.9 F (36.6 C) (Oral)   Resp (!) 23   Ht 5\' 6"  (1.676 m)   Wt 53.6 kg Comment: Scale A  SpO2 92%   BMI 19.08 kg/m  , BMI Body mass index is 19.08 kg/m. GENERAL:  Chronically ill-appearing.  Frail.  HEENT: Pupils equal round and reactive, fundi not visualized, oral mucosa unremarkable NECK:  No jugular venous distention, waveform within normal limits, carotid upstroke brisk and symmetric, no bruits, no thyromegaly LUNGS:  Bilateral rhonchi.  +productive cough HEART:  Tachycardic.  Irregularly irregular.  PMI not displaced or sustained,S1 and S2 within normal limits, no S3, no S4, no clicks, no rubs, no murmurs ABD:  Flat, positive bowel sounds normal in frequency in pitch, no bruits, no rebound, no guarding, no midline pulsatile mass, no hepatomegaly, no splenomegaly EXT:  2 plus pulses throughout, no edema, no cyanosis no clubbing SKIN:  No rashes no nodules NEURO:  Cranial nerves II through XII grossly intact, motor grossly intact  throughout PSYCH:  Cognitively intact, oriented to person place and time   Assessment & Plan .     # Persistent afib with RVR:  She has a history of atrial fibrillation.  Now with rapid ventricular response in the setting of CAP.  She underwent successful DCCV on 11/4 but went back into atrial fibrillation.  Rates remain uncontrolled.  She remains on IV amiodarone.  Her heart rate has improved on digoxin but is still not optimally controlled.  Will check a level in 3 days.  Agree with adding metoprolol.  Plan to transition to oral amiodarone tomorrow if heart rates are better controlled.  Plan to see her in clinic and schedule cardioversion in a couple weeks.  Continue Xarelto.    # Acute on chronic HFpEF:  Echo this admission revealed LVEF 55-60%.  Mild LVH.  She has moderate mitral annular calcification.  Right atrial pressure was 3 mmHg.  BNP was 746.  Her lungs have more rhonchi than crackles.  Blood pressure has been too low for diuresis thus far.  Will attempt gentle diuresis after cardioversion if BP allows.  She appears to be euvolemic on exam.  # Hyperlipidemia: #  Carotid stenosis: # PAD:  Continue rosuvastatin and Xarelto.  # CAP: # COPD: Management per primary team.   # Tobacco abuse;  Continue to encourage smoking cessation.   For questions or updates, please contact [facility] Please consult [email or web address] for contact info under        Signed, Chilton Si, MD

## 2023-06-26 NOTE — Plan of Care (Signed)
  Problem: Education: Goal: Knowledge of General Education information will improve Description: Including pain rating scale, medication(s)/side effects and non-pharmacologic comfort measures Outcome: Progressing   Problem: Health Behavior/Discharge Planning: Goal: Ability to manage health-related needs will improve Outcome: Progressing   Problem: Clinical Measurements: Goal: Ability to maintain clinical measurements within normal limits will improve Outcome: Progressing Goal: Will remain free from infection Outcome: Progressing Goal: Diagnostic test results will improve Outcome: Progressing Goal: Respiratory complications will improve Outcome: Progressing Goal: Cardiovascular complication will be avoided Outcome: Progressing   Problem: Activity: Goal: Risk for activity intolerance will decrease Outcome: Progressing   Problem: Nutrition: Goal: Adequate nutrition will be maintained Outcome: Progressing   Problem: Coping: Goal: Level of anxiety will decrease Outcome: Progressing   Problem: Elimination: Goal: Will not experience complications related to bowel motility Outcome: Progressing Goal: Will not experience complications related to urinary retention Outcome: Progressing   Problem: Pain Management: Goal: General experience of comfort will improve Outcome: Progressing   Problem: Safety: Goal: Ability to remain free from injury will improve Outcome: Progressing   Problem: Skin Integrity: Goal: Risk for impaired skin integrity will decrease Outcome: Progressing   Problem: Education: Goal: Ability to demonstrate management of disease process will improve Outcome: Progressing Goal: Ability to verbalize understanding of medication therapies will improve Outcome: Progressing Goal: Individualized Educational Video(s) Outcome: Progressing   Problem: Activity: Goal: Capacity to carry out activities will improve Outcome: Progressing   Problem: Cardiac: Goal:  Ability to achieve and maintain adequate cardiopulmonary perfusion will improve Outcome: Progressing   Problem: Education: Goal: Ability to demonstrate management of disease process will improve Outcome: Progressing Goal: Ability to verbalize understanding of medication therapies will improve Outcome: Progressing Goal: Individualized Educational Video(s) Outcome: Progressing   Problem: Activity: Goal: Capacity to carry out activities will improve Outcome: Progressing   Problem: Cardiac: Goal: Ability to achieve and maintain adequate cardiopulmonary perfusion will improve Outcome: Progressing

## 2023-06-27 ENCOUNTER — Other Ambulatory Visit (HOSPITAL_COMMUNITY): Payer: Self-pay

## 2023-06-27 DIAGNOSIS — I4891 Unspecified atrial fibrillation: Secondary | ICD-10-CM | POA: Diagnosis not present

## 2023-06-27 DIAGNOSIS — E782 Mixed hyperlipidemia: Secondary | ICD-10-CM | POA: Diagnosis not present

## 2023-06-27 LAB — CBC
HCT: 43.6 % (ref 36.0–46.0)
Hemoglobin: 15.2 g/dL — ABNORMAL HIGH (ref 12.0–15.0)
MCH: 32.8 pg (ref 26.0–34.0)
MCHC: 34.9 g/dL (ref 30.0–36.0)
MCV: 94 fL (ref 80.0–100.0)
Platelets: 348 10*3/uL (ref 150–400)
RBC: 4.64 MIL/uL (ref 3.87–5.11)
RDW: 14.5 % (ref 11.5–15.5)
WBC: 13.1 10*3/uL — ABNORMAL HIGH (ref 4.0–10.5)
nRBC: 0 % (ref 0.0–0.2)

## 2023-06-27 LAB — BASIC METABOLIC PANEL
Anion gap: 10 (ref 5–15)
BUN: 13 mg/dL (ref 8–23)
CO2: 23 mmol/L (ref 22–32)
Calcium: 9.9 mg/dL (ref 8.9–10.3)
Chloride: 101 mmol/L (ref 98–111)
Creatinine, Ser: 0.97 mg/dL (ref 0.44–1.00)
GFR, Estimated: >60 mL/min (ref >60)
Glucose, Bld: 94 mg/dL (ref 70–99)
Potassium: 3.3 mmol/L — ABNORMAL LOW (ref 3.5–5.1)
Sodium: 134 mmol/L — ABNORMAL LOW (ref 135–145)

## 2023-06-27 LAB — PHOSPHORUS: Phosphorus: 2.7 mg/dL (ref 2.5–4.6)

## 2023-06-27 LAB — MAGNESIUM: Magnesium: 1.8 mg/dL (ref 1.7–2.4)

## 2023-06-27 MED ORDER — FUROSEMIDE 20 MG PO TABS
20.0000 mg | ORAL_TABLET | ORAL | 0 refills | Status: DC | PRN
  Filled 2023-06-27: qty 30, 30d supply, fill #0

## 2023-06-27 MED ORDER — POTASSIUM CHLORIDE 20 MEQ PO PACK: 40.0000 meq | PACK | Freq: Once | ORAL | Status: DC

## 2023-06-27 MED ORDER — AMIODARONE HCL 200 MG PO TABS
200.0000 mg | ORAL_TABLET | Freq: Two times a day (BID) | ORAL | Status: DC
  Administered 2023-06-27: 200 mg via ORAL
  Filled 2023-06-27: qty 1

## 2023-06-27 MED ORDER — MAGNESIUM SULFATE 2 GM/50ML IV SOLN
2.0000 g | Freq: Once | INTRAVENOUS | Status: AC
  Administered 2023-06-27: 2 g via INTRAVENOUS
  Filled 2023-06-27: qty 50

## 2023-06-27 MED ORDER — POTASSIUM CHLORIDE CRYS ER 20 MEQ PO TBCR
40.0000 meq | EXTENDED_RELEASE_TABLET | Freq: Once | ORAL | Status: AC
  Administered 2023-06-27: 40 meq via ORAL
  Filled 2023-06-27: qty 2

## 2023-06-27 MED ORDER — AMIODARONE HCL 200 MG PO TABS
ORAL_TABLET | ORAL | 0 refills | Status: DC
  Filled 2023-06-27: qty 60, 46d supply, fill #0

## 2023-06-27 MED ORDER — AMIODARONE HCL 200 MG PO TABS
ORAL_TABLET | ORAL | 0 refills | Status: DC
  Filled 2023-06-27: qty 60, 53d supply, fill #0

## 2023-06-27 MED ORDER — AMIODARONE HCL 200 MG PO TABS: 200.0000 mg | ORAL_TABLET | Freq: Every day | ORAL | Status: DC

## 2023-06-27 MED ORDER — DIGOXIN 125 MCG PO TABS
0.1250 mg | ORAL_TABLET | Freq: Every day | ORAL | 0 refills | Status: DC
  Filled 2023-06-27: qty 30, 30d supply, fill #0

## 2023-06-27 NOTE — Evaluation (Signed)
Occupational Therapy Evaluation Patient Details Name: Carrie Case MRN: [id number] DOB: 1956/08/16 Today's Date: 06/27/2023   History of Present Illness Pt is a 67 yo female admitted 11/02 from [facility] ED for SOB, presenting with A-fib DCCV 11/04 back in A-fib. PMH: A-fib on Xarelto, PAD, HLD, carotid artery stenosis, GERD, tobacco use, and COPD   Clinical Impression   At baseline, pt is Independent with all ADLs, most IADLs, and functional mobility/transfers without an AD and works outside the home. Pt's daughter assists with transportation and grocery shopping due to pt's baseline visual deficits. Pt now presents with decreased activity tolerance, mild dynamic balance deficits during functional tasks, generalized B UE weakness, and decreased safety and independence with functional tasks. Pt currently demonstrates ability to complete ADLs Independent to Supervision and functional mobility/transfer without an AD with close Supervision for safety. Pt with soft but stable BP throughout session with RN aware and pt stating this is her baseline. Pt's HR in the 90s to 115 bpm and O2 >/92% on RA throughout session. RN present during a portion of session. Pt will benefit from acute skilled OT services to address deficits outlined below and increase safety and independence with functional tasks. Post acute discharge, pt will benefit from continued skilled OT services in the home to maximize rehab potential.       If plan is discharge home, recommend the following: A little help with walking and/or transfers;A little help with bathing/dressing/bathroom;Assistance with cooking/housework;Assist for transportation;Help with stairs or ramp for entrance    Functional Status Assessment  Patient has had a recent decline in their functional status and demonstrates the ability to make significant improvements in function in a reasonable and predictable amount of time.  Equipment Recommendations  None  recommended by OT (Pt already has needed equipment.)    Recommendations for Other Services       Precautions / Restrictions Precautions Precautions: Fall Restrictions Weight Bearing Restrictions: No      Mobility Bed Mobility Overal bed mobility: Modified Independent (increased time)                  Transfers Overall transfer level: Needs assistance Equipment used: None Transfers: Sit to/from Stand, Bed to chair/wheelchair/BSC Sit to Stand: Supervision     Step pivot transfers: Supervision     General transfer comment: Close Supervision for safety; assist needed with lines      Balance Overall balance assessment: Mild deficits observed, not formally tested                                         ADL either performed or assessed with clinical judgement   ADL Overall ADL's : Needs assistance/impaired Eating/Feeding: Independent;Sitting   Grooming: Supervision/safety;Standing   Upper Body Bathing: Supervision/ safety;Sitting   Lower Body Bathing: Supervison/ safety;Sit to/from stand   Upper Body Dressing : Modified independent;Sitting   Lower Body Dressing: Supervision/safety;Sitting/lateral leans;Sit to/from stand   Toilet Transfer: Supervision/safety;Regular Toilet;Grab bars;Ambulation   Toileting- Clothing Manipulation and Hygiene: Supervision/safety;Sit to/from stand;Sitting/lateral lean       Functional mobility during ADLs: Supervision/safety (close Supervision for safety; without an AD) General ADL Comments: Pt with decreased activity tolerance and mild deficits noted in dynamic balance during functional tasks. Pt requiring increased time for tasks due to decreased activity tolerance and fatigue.     Vision Baseline Vision/History: 4 Cataracts Ability to See in Adequate Light:  1 Impaired Patient Visual Report: No change from baseline Additional Comments: Impaired due to cataracts but [facility] for tasks assessed. Pt reports she  gave up driving due to cataracts.     Perception         Praxis         Pertinent Vitals/Pain Pain Assessment Pain Assessment: No/denies pain     Extremity/Trunk Assessment Upper Extremity Assessment Upper Extremity Assessment: Right hand dominant;Generalized weakness   Lower Extremity Assessment Lower Extremity Assessment: Defer to PT evaluation       Communication Communication Communication: No apparent difficulties   Cognition Arousal: Alert Behavior During Therapy: WFL for tasks assessed/performed Overall Cognitive Status: Within Functional Limits for tasks assessed                                 General Comments: AAOx4 and pleasant throughout session. Demonstrates good safety awareness and good insight into deficits. Able to follow multi-step commands consistently.     General Comments  Pt with soft but stable BP throughout session with RN aware and pt stating this is her baseline. Pt's HR in the 90s to 115 bpm and O2 >/92% on RA throughout session. RN present during a portion of session.    Exercises     Shoulder Instructions      Home Living Family/patient expects to be discharged to:: Private residence Living Arrangements: Alone Available Help at Discharge: Family;Available PRN/intermittently (daughter) Type of Home: Apartment Home Access: Level entry     Home Layout: One level     Bathroom Shower/Tub: Chief Strategy Officer: Standard Bathroom Accessibility: Yes How Accessible: Accessible via walker Home Equipment: Shower seat;Grab bars - tub/shower          Prior Functioning/Environment Prior Level of Function : Independent/Modified Independent;Working/employed             Mobility Comments: Independent without an AD ADLs Comments: Independent with all ADLs and most IADLs. Pt works cleaning houses. Pt's daughter assist with transportation and grocery shopping due to pt's vision issues.        OT Problem  List: Decreased activity tolerance;Decreased strength      OT Treatment/Interventions: Self-care/ADL training;Therapeutic exercise;Energy conservation;Therapeutic activities;Patient/family education    OT Goals(Current goals can be found in the care plan section) Acute Rehab OT Goals Patient Stated Goal: to return home and go with her daughter to see her brother who was recently placed in hospice care OT Goal Formulation: With patient Time For Goal Achievement: 07/11/23 Potential to Achieve Goals: Good ADL Goals Pt Will Perform Lower Body Bathing: with modified independence;sitting/lateral leans;sit to/from stand Pt Will Perform Lower Body Dressing: Independently;sitting/lateral leans;sit to/from stand Pt Will Transfer to Toilet: Independently;ambulating;regular height toilet Pt Will Perform Toileting - Clothing Manipulation and hygiene: Independently;sit to/from stand;sitting/lateral leans Pt/caregiver will Perform Home Exercise Program: Increased strength;Both right and left upper extremity;With theraband;With theraputty;Independently;With written HEP provided (Increased activity tolerance) Additional ADL Goal #1: Patient will demonstrate ability to Independently state 4 energy conservation strategies to increase safety and independence with functional tasks.  OT Frequency: Min 1X/week    Co-evaluation              AM-PAC OT "6 Clicks" Daily Activity     Outcome Measure Help from another person eating meals?: None Help from another person taking care of personal grooming?: A Little Help from another person toileting, which includes using toliet, bedpan, or urinal?: A  Little Help from another person bathing (including washing, rinsing, drying)?: A Little Help from another person to put on and taking off regular upper body clothing?: A Little Help from another person to put on and taking off regular lower body clothing?: A Little 6 Click Score: 19   End of Session Nurse  Communication: Mobility status;Other (comment) (soft BPs throughout)  Activity Tolerance: Patient tolerated treatment well Patient left: in bed;with call bell/phone within reach;Other (comment) (sitting EOB)  OT Visit Diagnosis: Unsteadiness on feet (R26.81);Other (comment) (Decreased activity tolerance)                Time: 2694-8546 OT Time Calculation (min): 17 min Charges:  OT General Charges $OT Visit: 1 Visit OT Evaluation $OT Eval Low Complexity: 1 Low  Floride Hutmacher "Kyle" M., OTR/L, MA Acute Rehab [phone]  Lendon Colonel 06/27/2023, 10:36 AM

## 2023-06-27 NOTE — TOC Transition Note (Signed)
Transition of Care [facility]) - CM/SW Discharge Note   Patient Details  Name: Carrie Case MRN: [id number] Date of Birth: 1956/05/07  [facility]) CM/SW Contact:  Harriet Masson, RN Phone Number: 06/27/2023, 1:06 PM   Clinical Narrative:    Patient stable for discharge.  Orders for Home Health.  Spoke to patient regarding transition needs .Choice offered with list provided per CMS guidelines from medicare.gov website with star ratings. Patient defers to this RNCM to find highly rated agency.  Kandee Keen with [facility] accepted referral. Address, Phone number and PCP verified. Patient has transportation home.    Final next level of care: Home w Home Health Services Barriers to Discharge: Barriers Resolved   Patient Goals and CMS Choice CMS Medicare.gov Compare Post Acute Care list provided to:: Patient Choice offered to / list presented to : Patient  Discharge Placement               home          Discharge Plan and Services Additional resources added to the After Visit Summary for                            [facility] Arranged: OT [facility] Agency: [facility] Date [facility] Agency Contacted: 06/27/23 Time HH Agency Contacted: 1306 Representative spoke with at [facility] Agency: Kandee Keen  Social Determinants of Health (SDOH) Interventions SDOH Screenings   Food Insecurity: No Food Insecurity (06/22/2023)  Housing: Low Risk  (06/22/2023)  Transportation Needs: No Transportation Needs (06/22/2023)  Utilities: Not At Risk (06/22/2023)  Alcohol Screen: Low Risk  (11/15/2022)  Depression (PHQ2-9): Medium Risk (05/20/2023)  Financial Resource Strain: Low Risk  (11/15/2022)  Physical Activity: Inactive (05/20/2023)  Social Connections: Socially Isolated (11/15/2022)  Stress: Stress Concern Present (05/20/2023)  Tobacco Use: High Risk (06/22/2023)     Readmission Risk Interventions    06/27/2023    1:06 PM  Readmission Risk Prevention Plan  Post Dischage Appt Complete   Medication Screening Complete  Transportation Screening Complete

## 2023-06-27 NOTE — Discharge Summary (Signed)
Physician Discharge Summary  Carrie Case UVO:[id number] DOB: October 01, 1955 DOA: 06/22/2023  PCP: Billie Lade, MD  Admit date: 06/22/2023 Discharge date: 06/27/2023  Time spent: 45 minutes  Recommendations for Outpatient Follow-up:  Cardiology Dr. Wyline Mood in 2 weeks, consideration of outpatient cardioversion Continued smoking cessation counseling   Discharge Diagnoses:  Principal Problem:   Atrial fibrillation with rapid ventricular response (HCC) Active Problems:   Acute on chronic diastolic CHF (congestive heart failure) (HCC)   COPD  GOLD 2 / still smoking    Hypokalemia   Hyperlipidemia   Malnutrition of moderate degree   Discharge Condition: Improved  Diet recommendation: Low-sodium, heart healthy  Filed Weights   06/25/23 0522 06/26/23 0517 06/27/23 0526  Weight: 53.1 kg 53.6 kg 55.7 kg    History of present illness:  67/F w A fib, COPD, carotid artery stenosis, peripheral vascular disease and dyslipidemia who presented with dyspnea.x 2 days, went to [facility] ED, noted to be in A-fib RVR with decompensated heart failure Labs- cr 1,0, AST 109, ALT 52, BNP 746 , hgb 13,7  -placed on Amiodarone drip and received furosemide.  11/04 direct current cardioversion. 11/05 back in A-fib RVR  Hospital Course:  Atrial fibrillation with rapid ventricular response (HCC) -sp DCCV 11/4 -back in rapid A-fib -Cardiology following, given digoxin X2 doses yesterday, now PO -Currently on amiodarone gtt, rivaroxaban -restart Toprol, she is also on CardizemCD 300mg  daily at baseline, unclear if this was held for soft BP   Acute on chronic diastolic CHF (congestive heart failure) (HCC) -Echo with EF 55 to 60%, mild LVH, RV preserved,  mild to moderate aortic regurgitation.  -Diuresed with IV Lasix, volume status has improved -CHF exacerbation likely related to A-fib RVR   Acute hypoxemic respiratory failure due to cardiogenic pulmonary edema and pneumonia.  -Weaned off O2    COPD  GOLD 2 / still smoking  No clinical signs of acute exacerbation.  CT chest with patchy nodular tree in bud infiltrates in both lungs with more dense consolidation in the lower lungs.  Positive emphysematous changes in the upper lungs. -11/03  chest radiograph with hyperinflation, increased lung marking. Positive interstitial infiltrates at bases bilaterally   Community acquired pneumonia  Blood cultures with no growth.  Treated with ceftriaxone and azithromycin, clinically suspect this was more volume overload, however did have leukocytosis, will continue antibiotics to complete 5-day course   Hypokalemia Hyponatremia. -Monitor   Hyperlipidemia Continue with statin therapy.    Malnutrition of moderate degree Continue with nutritional supplements.   Consultations: Cards  Discharge Exam: Vitals:   06/27/23 0751 06/27/23 0958  BP: 103/68   Pulse: 98 (!) 106  Resp: 19   Temp: 98.2 F (36.8 C)   SpO2: 92%    Gen: Awake, Alert, Oriented X 3,  HEENT: no JVD Lungs: Good air movement bilaterally, CTAB CVS: S1S2/irregular rhythm Abd: soft, Non tender, non distended, BS present Extremities: No edema Skin: no new rashes on exposed skin   Discharge Instructions   Discharge Instructions     Diet - low sodium heart healthy   Complete by: As directed    Increase activity slowly   Complete by: As directed       Allergies as of 06/27/2023       Reactions   Ibuprofen Hives        Medication List     STOP taking these medications    diltiazem 300 MG 24 hr capsule Commonly known as: [place] XT  TAKE these medications    albuterol (2.5 MG/3ML) 0.083% nebulizer solution Commonly known as: PROVENTIL Take 3 mLs (2.5 mg total) by nebulization every 6 (six) hours as needed for wheezing or shortness of breath.   amiodarone 200 MG tablet Commonly known as: PACERONE Take 200 mg twice a day for 2 weeks followed by 200 mg once daily   digoxin 0.125 MG  tablet Commonly known as: LANOXIN Take 1 tablet (0.125 mg total) by mouth daily. Start taking on: June 28, 2023   famotidine 20 MG tablet Commonly known as: Pepcid Take 1 tablet (20 mg total) by mouth daily after supper.   furosemide 20 MG tablet Commonly known as: Lasix Take 1 tablet (20 mg total) by mouth as needed. For increased swelling, weight gain of 3 LB in 1 day or 5 LB in 1 week   metoprolol succinate 25 MG 24 hr tablet Commonly known as: Toprol XL Take 1 tablet (25 mg total) by mouth daily.   pantoprazole 40 MG tablet Commonly known as: Protonix Take 1 tablet (40 mg total) by mouth daily. Take 30-60 min before first meal of the day   rosuvastatin 20 MG tablet Commonly known as: CRESTOR Take 1 tablet (20 mg total) by mouth daily.   Stiolto Respimat 2.5-2.5 MCG/ACT Aers Generic drug: Tiotropium Bromide-Olodaterol Inhale 2 puffs into the lungs in the morning.   Xarelto 20 MG Tabs tablet Generic drug: rivaroxaban Take 1 tablet (20 mg total) by mouth daily with supper.       Allergies  Allergen Reactions   Ibuprofen Hives      The results of significant diagnostics from this hospitalization (including imaging, microbiology, ancillary and laboratory) are listed below for reference.    Significant Diagnostic Studies: DG Chest 1 View  Result Date: 06/23/2023 CLINICAL DATA:  Worsening shortness of breath for 2 days. Bradycardia. EXAM: CHEST  1 VIEW COMPARISON:  Radiographs 06/21/2023 and 02/07/2023.  CT 06/21/2023. FINDINGS: 1150 hours. The heart size and mediastinal contours are stable with aortic atherosclerosis. Patchy peribronchial airspace opacities at both lung bases have not significantly changed over the last 2 days. There is no pleural effusion or pneumothorax. No acute osseous findings are evident. Telemetry leads overlie the chest. IMPRESSION: No significant change in patchy peribronchial airspace opacities at both lung bases, suspicious for  bronchopneumonia based on previous CT. Radiographic follow-up recommended to ensure resolution. Electronically Signed   By: Carey Bullocks M.D.   On: 06/23/2023 15:52   ECHOCARDIOGRAM COMPLETE  Result Date: 06/23/2023    ECHOCARDIOGRAM REPORT   Patient Name:   Carrie Case Date of Exam: 06/23/2023 Medical Rec #:  [id number]        Height:       66.0 in Accession #:    [id number]       Weight:       117.8 lb Date of Birth:  1956-07-18        BSA:          1.597 m Patient Age:    67 years         BP:           90/79 mmHg Patient Gender: F                HR:           137 bpm. Exam Location:  Inpatient Procedure: 2D Echo, Cardiac Doppler and Color Doppler Indications:    R06.02 SOB; I48.91* Unspeicified atrial fibrillation  History:  Patient has no prior history of Echocardiogram examinations.                 CHF, Abnormal ECG, COPD, Arrythmias:Atrial Fibrillation; Risk                 Factors:Dyslipidemia and Current Smoker.  Sonographer:    Sheralyn Boatman RDCS Referring Phys: [id number] PROSPER M AMPONSAH IMPRESSIONS  1. Left ventricular ejection fraction, by estimation, is 55 to 60%. The left ventricle has normal function. The left ventricle has no regional wall motion abnormalities. There is mild left ventricular hypertrophy. Left ventricular diastolic function could not be evaluated.  2. Right ventricular systolic function is normal. The right ventricular size is normal. There is moderately elevated pulmonary artery systolic pressure.  3. Left atrial size was moderately dilated.  4. Right atrial size was moderately dilated.  5. The mitral valve is normal in structure. Trivial mitral valve regurgitation. No evidence of mitral stenosis. Moderate mitral annular calcification.  6. The aortic valve is tricuspid. Aortic valve regurgitation is mild to moderate. Aortic valve sclerosis/calcification is present, without any evidence of aortic stenosis.  7. The inferior vena cava is normal in size with greater than 50%  respiratory variability, suggesting right atrial pressure of 3 mmHg. FINDINGS  Left Ventricle: Left ventricular ejection fraction, by estimation, is 55 to 60%. The left ventricle has normal function. The left ventricle has no regional wall motion abnormalities. The left ventricular internal cavity size was normal in size. There is  mild left ventricular hypertrophy. Left ventricular diastolic function could not be evaluated due to atrial fibrillation. Left ventricular diastolic function could not be evaluated. Right Ventricle: The right ventricular size is normal. Right ventricular systolic function is normal. There is moderately elevated pulmonary artery systolic pressure. The tricuspid regurgitant velocity is 2.81 m/s, and with an assumed right atrial pressure of 15 mmHg, the estimated right ventricular systolic pressure is 46.6 mmHg. Left Atrium: Left atrial size was moderately dilated. Right Atrium: Right atrial size was moderately dilated. Pericardium: There is no evidence of pericardial effusion. Mitral Valve: The mitral valve is normal in structure. Moderate mitral annular calcification. Trivial mitral valve regurgitation. No evidence of mitral valve stenosis. Tricuspid Valve: The tricuspid valve is normal in structure. Tricuspid valve regurgitation is mild . No evidence of tricuspid stenosis. Aortic Valve: The aortic valve is tricuspid. Aortic valve regurgitation is mild to moderate. Aortic regurgitation PHT measures 500 msec. Aortic valve sclerosis/calcification is present, without any evidence of aortic stenosis. Pulmonic Valve: The pulmonic valve was not well visualized. Pulmonic valve regurgitation is not visualized. No evidence of pulmonic stenosis. Aorta: The aortic root is normal in size and structure. Venous: The inferior vena cava is normal in size with greater than 50% respiratory variability, suggesting right atrial pressure of 3 mmHg. IAS/Shunts: No atrial level shunt detected by color flow  Doppler.  LEFT VENTRICLE PLAX 2D LVIDd:         4.10 cm LVIDs:         3.00 cm LV PW:         1.20 cm LV IVS:        0.90 cm LVOT diam:     2.20 cm LV SV:         58 LV SV Index:   36 LVOT Area:     3.80 cm  LV Volumes (MOD) LV vol d, MOD A2C: 27.6 ml LV vol d, MOD A4C: 31.0 ml LV vol s, MOD A2C: 16.9 ml LV  vol s, MOD A4C: 17.1 ml LV SV MOD A2C:     10.7 ml LV SV MOD A4C:     31.0 ml LV SV MOD BP:      12.0 ml RIGHT VENTRICLE            IVC RV S prime:     6.53 cm/s  IVC diam: 1.80 cm TAPSE (M-mode): 0.5 cm LEFT ATRIUM             Index        RIGHT ATRIUM           Index LA diam:        3.10 cm 1.94 cm/m   RA Area:     15.00 cm LA Vol (A2C):   38.4 ml 24.05 ml/m  RA Volume:   37.30 ml  23.36 ml/m LA Vol (A4C):   34.0 ml 21.29 ml/m LA Biplane Vol: 37.7 ml 23.61 ml/m  AORTIC VALVE LVOT Vmax:   106.00 cm/s LVOT Vmean:  67.250 cm/s LVOT VTI:    0.152 m AI PHT:      500 msec  AORTA Ao Root diam: 3.00 cm Ao Asc diam:  3.35 cm MITRAL VALVE                TRICUSPID VALVE MV Area (PHT): 4.45 cm     TR Peak grad:   31.6 mmHg MV Decel Time: 170 msec     TR Vmax:        281.00 cm/s MV E velocity: 105.73 cm/s                             SHUNTS                             Systemic VTI:  0.15 m                             Systemic Diam: 2.20 cm Olga Millers MD Electronically signed by Olga Millers MD Signature Date/Time: 06/23/2023/10:47:16 AM    Final     Microbiology: Recent Results (from the past 240 hour(s))  MRSA Next Gen by PCR, Nasal     Status: Abnormal   Collection Time: 06/22/23 10:04 AM   Specimen: Nasal Mucosa; Nasal Swab  Result Value Ref Range Status   MRSA by PCR Next Gen DETECTED (A) NOT DETECTED Final    Comment: RESULT CALLED TO, READ BACK BY AND VERIFIED WITH: RN [facility] ON [id number] @1220H  BY SM (NOTE) The GeneXpert MRSA Assay (FDA approved for NASAL specimens only), is one component of a comprehensive MRSA colonization surveillance program. It is not intended to diagnose MRSA infection nor  to guide or monitor treatment for MRSA infections. Test performance is not FDA approved in patients less than 20 years old. Performed at [facility], [address] [place], [place] [address]   Culture, blood (Routine X 2) w Reflex to ID Panel     Status: None (Preliminary result)   Collection Time: 06/23/23 11:32 AM   Specimen: BLOOD RIGHT HAND  Result Value Ref Range Status   Specimen Description BLOOD RIGHT HAND  Final   Special Requests   Final    BOTTLES DRAWN AEROBIC AND ANAEROBIC Blood Culture adequate volume   Culture   Final    NO GROWTH 4 DAYS Performed at  [facility], [address] [place], [place] [address]    Report Status PENDING  Incomplete  Culture, blood (Routine X 2) w Reflex to ID Panel     Status: None (Preliminary result)   Collection Time: 06/23/23 11:33 AM   Specimen: BLOOD RIGHT HAND  Result Value Ref Range Status   Specimen Description BLOOD RIGHT HAND  Final   Special Requests   Final    BOTTLES DRAWN AEROBIC AND ANAEROBIC Blood Culture adequate volume   Culture   Final    NO GROWTH 4 DAYS Performed at [facility], [address] [place], [place] [address]    Report Status PENDING  Incomplete     Labs: Basic Metabolic Panel: Recent Labs  Lab 06/22/23 1154 06/23/23 0234 06/23/23 1806 06/24/23 0221 06/25/23 0238 06/26/23 0235 06/27/23 0235  NA 136   < > 133* 134* 131* 133* 134*  K 3.7   < > 4.1 4.0 4.5 3.8 3.3*  CL 104   < > 99 97* 97* 95* 101  CO2 22   < > 23 25 26 23 23   GLUCOSE 112*   < > 121* 97 92 99 94  BUN 18   < > 19 17 16 15 13   CREATININE 1.09*   < > 1.10* 1.14* 1.15* 1.10* 0.97  CALCIUM 10.1   < > 10.4* 10.5* 9.9 10.1 9.9  MG 2.3  --   --   --  2.0 1.9 1.8  PHOS 2.7  --   --   --  1.9* 2.5 2.7   < > = values in this interval not displayed.   Liver Function Tests: Recent Labs  Lab 06/22/23 1154  AST 109*  ALT 52*  ALKPHOS 122  BILITOT 0.6  PROT 6.9  ALBUMIN 2.2*   No results for input(s):  "LIPASE", "AMYLASE" in the last 168 hours. No results for input(s): "AMMONIA" in the last 168 hours. CBC: Recent Labs  Lab 06/22/23 1154 06/23/23 0234 06/24/23 0221 06/25/23 0238 06/26/23 0235 06/27/23 0235  WBC 14.7* 16.8* 15.5* 15.4* 17.0* 13.1*  NEUTROABS 12.0*  --   --   --   --   --   HGB 13.7 14.8 14.3 14.8 15.8* 15.2*  HCT 39.7 42.5 41.5 43.0 45.7 43.6  MCV 95.7 95.1 94.7 94.3 94.8 94.0  PLT 354 397 409* 370 392 348   Cardiac Enzymes: No results for input(s): "CKTOTAL", "CKMB", "CKMBINDEX", "TROPONINI" in the last 168 hours. BNP: BNP (last 3 results) Recent Labs    06/22/23 1154  BNP 746.2*    ProBNP (last 3 results) No results for input(s): "PROBNP" in the last 8760 hours.  CBG: No results for input(s): "GLUCAP" in the last 168 hours.     Signed:  Zannie Cove MD.  [facility] 06/27/2023, 11:32 AM

## 2023-06-27 NOTE — Evaluation (Signed)
Physical Therapy Evaluation Patient Details Name: Carrie Case MRN: [id number] DOB: [birth year] Today's Date: 06/27/2023  History of Present Illness  Pt is a 67 yo female admitted 11/02 from [facility] ED for SOB, presenting with A-fib DCCV 11/04 back in A-fib. PMH: A-fib on Xarelto, PAD, HLD, carotid artery stenosis, GERD, tobacco use, and COPD  Clinical Impression  Pt was pleasant and excited for mobility. Pt reported 2 falls over past 2 weeks, while outside on flat surface. Ambulated CGA for several LOBs during ambulation, pt would benefit from use of assisted device to increase stability and safety. Pt would benefit from continued skilled acute physical therapy to enhance safety and independence. Pt would benefit from home health physical therapy to further address functional deficits.       If plan is discharge home, recommend the following: A little help with walking and/or transfers   Can travel by private vehicle        Equipment Recommendations Rolling walker (2 wheels)  Recommendations for Other Services       Functional Status Assessment Patient has had a recent decline in their functional status and demonstrates the ability to make significant improvements in function in a reasonable and predictable amount of time.     Precautions / Restrictions Precautions Precautions: Fall Restrictions Weight Bearing Restrictions: No      Mobility  Bed Mobility Overal bed mobility: Modified Independent                  Transfers Overall transfer level: Needs assistance Equipment used: None Transfers: Sit to/from Stand Sit to Stand: Supervision           General transfer comment: Close Supervision for safety; assist needed with lines    Ambulation/Gait Ambulation/Gait assistance: Contact guard assist Gait Distance (Feet): 400 Feet Assistive device: None Gait Pattern/deviations: WFL(Within Functional Limits)   Gait velocity interpretation: >4.37  ft/sec, indicative of normal walking speed   General Gait Details: Sways toward and experiences LOB when turning head for DGI  Stairs            Wheelchair Mobility     Tilt Bed    Modified Rankin (Stroke Patients Only)       Balance Overall balance assessment: Needs assistance Sitting-balance support: No upper extremity supported, Feet unsupported, Feet supported Sitting balance-Leahy Scale: Good     Standing balance support: No upper extremity supported Standing balance-Leahy Scale: Fair                   Standardized Balance Assessment Standardized Balance Assessment : Dynamic Gait Index   Dynamic Gait Index Level Surface: Normal Change in Gait Speed: Normal Gait with Horizontal Head Turns: Moderate Impairment Gait with Vertical Head Turns: Mild Impairment Gait and Pivot Turn: Moderate Impairment Step Over Obstacle: Mild Impairment Step Around Obstacles: Mild Impairment       Pertinent Vitals/Pain Pain Assessment Pain Assessment: No/denies pain    Home Living Family/patient expects to be discharged to:: Private residence Living Arrangements: Alone Available Help at Discharge: Family;Available PRN/intermittently (daughter) Type of Home: Apartment Home Access: Level entry       Home Layout: One level Home Equipment: Shower seat;Grab bars - tub/shower      Prior Function Prior Level of Function : Independent/Modified Independent;Working/employed             Mobility Comments: Independent without an AD ADLs Comments: Independent with all ADLs and most IADLs. Pt works cleaning houses. Pt's daughter assist with transportation and  grocery shopping due to pt's vision issues.     Extremity/Trunk Assessment   Upper Extremity Assessment Upper Extremity Assessment: Right hand dominant;Generalized weakness    Lower Extremity Assessment Lower Extremity Assessment: Defer to PT evaluation       Communication    Communication Communication: No apparent difficulties  Cognition Arousal: Alert Behavior During Therapy: WFL for tasks assessed/performed Overall Cognitive Status: Within Functional Limits for tasks assessed                                          General Comments General comments (skin integrity, edema, etc.): Stairs not assessed for DGI as pt states she does not need to use any during normal ADLs. Score predicitive of increased falls.    Exercises     Assessment/Plan    PT Assessment Patient needs continued PT services  PT Problem List Decreased balance;Decreased mobility;Decreased knowledge of use of DME;Decreased strength       PT Treatment Interventions DME instruction;Gait training;Therapeutic activities;Therapeutic exercise;Balance training;Stair training;Functional mobility training    PT Goals (Current goals can be found in the Care Plan section)  Acute Rehab PT Goals Patient Stated Goal: Return home and be with family PT Goal Formulation: With patient Time For Goal Achievement: 07/11/23 Potential to Achieve Goals: Good    Frequency Min 1X/week     Co-evaluation               AM-PAC PT "6 Clicks" Mobility  Outcome Measure Help needed turning from your back to your side while in a flat bed without using bedrails?: None Help needed moving from lying on your back to sitting on the side of a flat bed without using bedrails?: None Help needed moving to and from a bed to a chair (including a wheelchair)?: None Help needed standing up from a chair using your arms (e.g., wheelchair or bedside chair)?: None Help needed to walk in hospital room?: None Help needed climbing 3-5 steps with a railing? : A Little 6 Click Score: 23    End of Session   Activity Tolerance: Patient tolerated treatment well Patient left: in chair;with call bell/phone within reach Nurse Communication: Mobility status PT Visit Diagnosis: Unsteadiness on feet  (R26.81);Repeated falls (R29.6);Other abnormalities of gait and mobility (R26.89);History of falling (Z91.81)    Time: 4540-9811 PT Time Calculation (min) (ACUTE ONLY): 16 min   Charges:   PT Evaluation $PT Eval Low Complexity: 1 Low   PT General Charges $$ ACUTE PT VISIT: 1 Visit         Andrey Farmer SPT Secure chat preferred   Darlin Drop 06/27/2023, 1:43 PM

## 2023-06-27 NOTE — Progress Notes (Signed)
Patient Name: Carrie Case Date of Encounter: 06/27/2023 [facility] Cardiologist: Dina Rich, MD   Interval Summary  .    Breathing is stable.  Denies palpitations. Eager to go home.  Brother is critically ill.   Vital Signs .    Vitals:   06/26/23 2323 06/27/23 0526 06/27/23 0710 06/27/23 0751  BP: 100/71 104/68  103/68  Pulse: 90 86 93 98  Resp: 18 20 20 19   Temp: 98.2 F (36.8 C) 98.2 F (36.8 C)  98.2 F (36.8 C)  TempSrc: Oral Oral  Oral  SpO2:  92% 93% 92%  Weight:  55.7 kg    Height:        Intake/Output Summary (Last 24 hours) at 06/27/2023 0926 Last data filed at 06/26/2023 2200 Gross per 24 hour  Intake 307.76 ml  Output 400 ml  Net -92.24 ml      06/27/2023    5:26 AM 06/26/2023    5:17 AM 06/25/2023    5:22 AM  Last 3 Weights  Weight (lbs) 122 lb 12.7 oz 118 lb 3.2 oz 117 lb  Weight (kg) 55.7 kg 53.615 kg 53.071 kg      Telemetry/ECG    Atrial fibrillation.  Rate mostly <100 bpm..  - Personally Reviewed  Physical Exam .    VS:  BP 103/68 (BP Location: Left Arm)   Pulse 98   Temp 98.2 F (36.8 C) (Oral)   Resp 19   Ht 5\' 6"  (1.676 m)   Wt 55.7 kg   SpO2 92%   BMI 19.82 kg/m  , BMI Body mass index is 19.82 kg/m. GENERAL:  Chronically ill-appearing.  Frail.  HEENT: Pupils equal round and reactive, fundi not visualized, oral mucosa unremarkable NECK:  No jugular venous distention, waveform within normal limits, carotid upstroke brisk and symmetric, no bruits, no thyromegaly LUNGS: CTAB HEART:  Tachycardic.  Irregularly irregular.  PMI not displaced or sustained,S1 and S2 within normal limits, no S3, no S4, no clicks, no rubs, no murmurs ABD:  Flat, positive bowel sounds normal in frequency in pitch, no bruits, no rebound, no guarding, no midline pulsatile mass, no hepatomegaly, no splenomegaly EXT:  2 plus pulses throughout, no edema, no cyanosis no clubbing SKIN:  No rashes no nodules NEURO:  Cranial nerves II through XII  grossly intact, motor grossly intact throughout PSYCH:  Cognitively intact, oriented to person place and time   Assessment & Plan .     # Persistent afib with RVR:  She has a history of atrial fibrillation.  Now with rapid ventricular response in the setting of CAP.  She underwent successful DCCV on 11/4 but went back into atrial fibrillation.  Rates are now better controlled on amiodarone, metoprolol and digoxin.  Will transition to oral amiodarone today.  Plan for 200mg  bid x 1 week then 200mg  daily.  Plan for outpatient DCCV.  Continue Xarelto.    # Acute on chronic HFpEF:  Echo this admission revealed LVEF 55-60%.  Mild LVH.  She has moderate mitral annular calcification.  Right atrial pressure was 3 mmHg.  BNP was 746.  She appears euvolemic.   # Hyperlipidemia: # Carotid stenosis: # PAD:  Continue rosuvastatin and Xarelto.  # CAP: # COPD: Management per primary team.   # Tobacco abuse;  Continue to encourage smoking cessation.   For questions or updates, please contact [facility] Please consult [email or web address] for contact info under        Signed, Elmarie Shiley  Duke Salvia, MD

## 2023-06-28 ENCOUNTER — Telehealth: Payer: Self-pay

## 2023-06-28 LAB — CULTURE, BLOOD (ROUTINE X 2)
Culture: NO GROWTH
Culture: NO GROWTH
Special Requests: ADEQUATE
Special Requests: ADEQUATE

## 2023-06-28 NOTE — Transitions of Care (Post Inpatient/ED Visit) (Signed)
   06/28/2023  Name: Carrie Case MRN: [id number] DOB: [birth year]  Today's TOC FU Call Status: Today's TOC FU Call Status:: Unsuccessful Call (1st Attempt) Unsuccessful Call (1st Attempt) Date: 06/28/23  Attempted to reach the patient regarding the most recent Inpatient/ED visit.  Follow Up Plan: Additional outreach attempts will be made to reach the patient to complete the Transitions of Care (Post Inpatient/ED visit) call.   Lonia Chimera, RN, BSN, CEN [employer]- Transition of Care Team.  [facility] [phone]

## 2023-06-29 ENCOUNTER — Other Ambulatory Visit (HOSPITAL_COMMUNITY): Payer: Self-pay

## 2023-07-01 ENCOUNTER — Ambulatory Visit: Payer: Medicare HMO | Attending: Nurse Practitioner | Admitting: Nurse Practitioner

## 2023-07-01 ENCOUNTER — Other Ambulatory Visit: Payer: Self-pay

## 2023-07-01 ENCOUNTER — Encounter: Payer: Self-pay | Admitting: Nurse Practitioner

## 2023-07-01 ENCOUNTER — Other Ambulatory Visit (HOSPITAL_COMMUNITY): Payer: Self-pay

## 2023-07-01 ENCOUNTER — Telehealth: Payer: Self-pay

## 2023-07-01 VITALS — BP 120/70 | HR 74 | Ht 66.0 in | Wt 124.4 lb

## 2023-07-01 DIAGNOSIS — I4891 Unspecified atrial fibrillation: Secondary | ICD-10-CM

## 2023-07-01 DIAGNOSIS — I739 Peripheral vascular disease, unspecified: Secondary | ICD-10-CM

## 2023-07-01 DIAGNOSIS — R5383 Other fatigue: Secondary | ICD-10-CM | POA: Diagnosis not present

## 2023-07-01 DIAGNOSIS — E785 Hyperlipidemia, unspecified: Secondary | ICD-10-CM | POA: Diagnosis not present

## 2023-07-01 DIAGNOSIS — R0902 Hypoxemia: Secondary | ICD-10-CM

## 2023-07-01 DIAGNOSIS — I4819 Other persistent atrial fibrillation: Secondary | ICD-10-CM | POA: Diagnosis not present

## 2023-07-01 DIAGNOSIS — I6523 Occlusion and stenosis of bilateral carotid arteries: Secondary | ICD-10-CM | POA: Diagnosis not present

## 2023-07-01 DIAGNOSIS — J449 Chronic obstructive pulmonary disease, unspecified: Secondary | ICD-10-CM

## 2023-07-01 MED ORDER — ROSUVASTATIN CALCIUM 20 MG PO TABS
20.0000 mg | ORAL_TABLET | Freq: Every day | ORAL | 1 refills | Status: DC
  Filled 2023-07-01 – 2023-08-02 (×2): qty 90, 90d supply, fill #0
  Filled 2023-11-01: qty 90, 90d supply, fill #1

## 2023-07-01 MED ORDER — DIGOXIN 125 MCG PO TABS
0.1250 mg | ORAL_TABLET | Freq: Every day | ORAL | 0 refills | Status: DC
  Filled 2023-07-01 – 2023-07-22 (×4): qty 90, 90d supply, fill #0
  Filled ????-??-??: fill #0

## 2023-07-01 MED ORDER — AMIODARONE HCL 200 MG PO TABS
ORAL_TABLET | ORAL | 1 refills | Status: DC
  Filled 2023-07-01: qty 90, fill #0
  Filled 2023-07-14 – 2023-07-15 (×2): qty 90, 30d supply, fill #0
  Filled 2023-07-16: qty 90, 90d supply, fill #0
  Filled 2023-07-29: qty 90, 30d supply, fill #0
  Filled 2023-07-31 – 2023-08-02 (×2): qty 90, 90d supply, fill #0
  Filled 2023-08-02 (×2): qty 90, 30d supply, fill #0
  Filled 2023-11-06: qty 90, 90d supply, fill #1

## 2023-07-01 MED ORDER — RIVAROXABAN 20 MG PO TABS
20.0000 mg | ORAL_TABLET | Freq: Every day | ORAL | 1 refills | Status: DC
  Filled 2023-07-01 – 2023-08-08 (×2): qty 90, 90d supply, fill #0
  Filled 2023-11-03: qty 90, 90d supply, fill #1

## 2023-07-01 NOTE — Transitions of Care (Post Inpatient/ED Visit) (Signed)
   07/01/2023  Name: Carrie Case MRN: [id number] DOB: 1955-11-08  Today's TOC FU Call Status: Today's TOC FU Call Status:: Successful TOC FU Call Completed TOC FU Call Complete Date: 07/01/23 Patient's Name and Date of Birth confirmed.  Transition Care Management Follow-up Telephone Call How have you been since you were released from the hospital?: Better (reports no heart racing right now.) Any questions or concerns?: No  Items Reviewed: Did you receive and understand the discharge instructions provided?: Yes Medications obtained,verified, and reconciled?: Yes (Medications Reviewed) Any new allergies since your discharge?: No Dietary orders reviewed?: Yes Type of Diet Ordered:: low salt diet, Do you have support at home?: Yes People in Home: child(ren), adult Name of Support/Comfort Primary Source: daughter  Medications Reviewed Today: Medications Reviewed Today   Medications were not reviewed in this encounter     Home Care and Equipment/Supplies: Were Home Health Services Ordered?: Yes Name of Home Health Agency:: [facility] Has Agency set up a time to come to your home?: Yes First Home Health Visit Date:  (Home health to start next week due to patients brothers death) Any new equipment or medical supplies ordered?: No  Functional Questionnaire: Do you need assistance with bathing/showering or dressing?: Yes (daughter for stand by) Do you need assistance with meal preparation?: No Do you need assistance with eating?: No Do you have difficulty maintaining continence: No Do you need assistance with getting out of bed/getting out of a chair/moving?: No Do you have difficulty managing or taking your medications?: No  Follow up appointments reviewed: PCP Follow-up appointment confirmed?: Yes Date of PCP follow-up appointment?: 08/05/23 Follow-up Provider: PCP.Marland Kitchen Patient denies need for sooner follow up Specialist Hospital Follow-up appointment confirmed?: Yes Date of  Specialist follow-up appointment?: 07/01/23 Follow-Up Specialty Provider:: Cardiology Do you need transportation to your follow-up appointment?: No Do you understand care options if your condition(s) worsen?: Yes-patient verbalized understanding  SDOH Interventions Today    Flowsheet Row Most Recent Value  SDOH Interventions   Food Insecurity Interventions Intervention Not Indicated  Housing Interventions Intervention Not Indicated  Transportation Interventions Intervention Not Indicated  [daughter normally takes to her appointments]      Reviewed medications with patient Reviewed discharge instructions.  Per instructions patient to be on a low salt, heart healthy diet, however she reports that she told that she can eat anything she wants.  Patient reports home health has called but has not visited due patients brother passing away.  Patient reports her only question is if she can travel 10 hours to the funeral. Encouraged patient to discuss with cardiology at her visit this am.   Offered 30 day TOC program and patient decline.  Provided my contact information and patient wrote down my information and will call me if needed. Reviewed daily weights and patient reports that she can not see the scales. Reviewed talking scales with patient as a suggestion.    Lonia Chimera, RN, BSN, CEN [employer]- Transition of Care Team.  [facility] [phone]

## 2023-07-01 NOTE — Patient Instructions (Addendum)
Medication Instructions:  Your physician recommends that you continue on your current medications as directed. Please refer to the Current Medication list given to you today.  Labwork: In 1 week   Testing/Procedures: None   Follow-Up: Your physician recommends that you schedule a follow-up appointment in: 4 week   Any Other Special Instructions Will Be Listed Below (If Applicable).  If you need a refill on your cardiac medications before your next appointment, please call your pharmacy.

## 2023-07-01 NOTE — Progress Notes (Unsigned)
Cardiology Office Note:  .   Date:  07/01/2023 ID:  [name], DOB 1956/05/29, MRN [id number] PCP: Billie Lade, MD  [facility] Providers Cardiologist:  Dina Rich, MD PV Cardiologist:  [name], MD    History of Present Illness: .   KEIYONNA SADLOWSKI is a 67 y.o. female with a PMH of A-fib, PAD, HLD, carotid artery stenosis, tobacco use, and COPD, who presents today for scheduled follow-up.   I last saw this patient on February 25, 2023.  She was overall doing well at the time.  06/03/2023 - Today she presents for follow-up. Patient has main chief concern/complaint is fatigue, and says she has lost her get up and go.  Says "I do not like being a couch potato.  I sit on the couch 24/7."  Says she has missed maybe 1 dose of Xarelto in the past 6 weeks.  Also admits to constant headaches, and intermittent bleeding noted in her ear canals.  Denies any migraine symptoms/red flag symptoms.  Says she has also been evaluated by PCP regarding bleeding in her ear canals, says she was told that this may be swimmer's ear, but says she knows this is not swimmer's ear.  Denies any acute vision changes or strokelike symptoms. Denies any chest pain, shortness of breath, palpitations, syncope, presyncope, dizziness, orthopnea, PND, swelling or significant weight changes, acute bleeding, or claudication.  She states she has had 1 fall in the past 6 months, denied any syncope related to event.  Stated "I just fell."  Says this happens sometimes, not as often as it used to.  Says this is due to possibly feeling off balance.  Admits to insomnia, has been on multiple medications that do not seem to work.  Admits that she takes a friend's Xanax prescription that helps her poor sleep-says PCP is also aware of this.  Presented to [facility] on June 21, 2023 for A-fib with RVR, CHF, pneumonia, UTI, she was transferred to [facility] for further care.  BNP 746.  AST 109, ALT 52.  She  was placed on amiodarone drip and received Lasix.  Underwent DCCV on June 24, 2023.  The following day she went back to A-fib with RVR.  Also received 2 doses of digoxin, was restarted on Toprol and also on Cardizem CD3 100 mg daily at baseline.  Updated echo revealed EF 55 to 60%, mild to moderate AR, mild LVH and preserved RV function.  It was felt that CHF exacerbation was likely related to A-fib with RVR.  Was treated with antibiotics for CAP.  Today she presents for 4-week follow-up.  She states she is doing well.  Continues to note fatigue. Denies any chest pain, shortness of breath, palpitations, syncope, presyncope, dizziness, orthopnea, PND, swelling or significant weight changes, acute bleeding, or claudication.  Wants to know when she can have her cardioversion performed.  She is leaving this Saturday for her brother's funeral and will be gone for only 1 day and returning later that same day.  ROS: Negative. Seen HPI.   Studies Reviewed: .    Echo 06/2023: 1. Left ventricular ejection fraction, by estimation, is 55 to 60%. The  left ventricle has normal function. The left ventricle has no regional  wall motion abnormalities. There is mild left ventricular hypertrophy.  Left ventricular diastolic function  could not be evaluated.   2. Right ventricular systolic function is normal. The right ventricular  size is normal. There is moderately elevated  pulmonary artery systolic  pressure.   3. Left atrial size was moderately dilated.   4. Right atrial size was moderately dilated.   5. The mitral valve is normal in structure. Trivial mitral valve  regurgitation. No evidence of mitral stenosis. Moderate mitral annular  calcification.   6. The aortic valve is tricuspid. Aortic valve regurgitation is mild to  moderate. Aortic valve sclerosis/calcification is present, without any  evidence of aortic stenosis.   7. The inferior vena cava is normal in size with greater than 50%   respiratory variability, suggesting right atrial pressure of 3 mmHg.  Echo 10/2022:  1. Left ventricular ejection fraction, by estimation, is 55 to 60%. The  left ventricle has normal function. The left ventricle has no regional  wall motion abnormalities. There is mild concentric left ventricular  hypertrophy. Left ventricular diastolic  parameters are indeterminate. The average left ventricular global  longitudinal strain is -19.6 %. The global longitudinal strain is normal.   2. Right ventricular systolic function is low normal. The right  ventricular size is normal. There is normal pulmonary artery systolic  pressure. The estimated right ventricular systolic pressure is 35.7 mmHg.   3. Left atrial size was severely dilated.   4. Right atrial size was moderately dilated.   5. The mitral valve is degenerative. Mild mitral valve regurgitation.  Moderate mitral annular calcification.   6. The aortic valve is tricuspid. There is mild calcification of the  aortic valve. Aortic valve regurgitation is mild. Aortic valve sclerosis  is present, with no evidence of aortic valve stenosis. Aortic  regurgitation PHT measures 560 msec.   7. The inferior vena cava is normal in size with greater than 50%  respiratory variability, suggesting right atrial pressure of 3 mmHg.   Comparison(s): Prior images reviewed side by side. LVEF remains normal  range at 55-60%.   Risk Assessment/Calculations:    CHA2DS2-VASc Score = 3  This indicates a 3.2% annual risk of stroke. The patient's score is based upon: CHF History: 0 HTN History: 0 Diabetes History: 0 Stroke History: 0 Vascular Disease History: 1 Age Score: 1 Gender Score: 1   Physical Exam:   VS:  BP 120/70   Pulse 74   Ht 5\' 6"  (1.676 m)   Wt 124 lb 6.4 oz (56.4 kg)   SpO2 95%   BMI 20.08 kg/m    Wt Readings from Last 3 Encounters:  07/01/23 124 lb 6.4 oz (56.4 kg)  06/27/23 122 lb 12.7 oz (55.7 kg)  06/03/23 128 lb 12.8 oz (58.4  kg)    GEN: Well nourished, well developed in no acute distress NECK: No JVD; No carotid bruits CARDIAC: S1/S2, irregularly irregular rhythm, no murmurs, rubs, gallops RESPIRATORY:  Clear to auscultation without rales, wheezing or rhonchi  ABDOMEN: Soft, non-tender, non-distended EXTREMITIES:  No edema; No deformity   ASSESSMENT AND PLAN: .    Persistent A-fib, fatigue Denies any palpitations or tachycardia.  Does notice fatigue, which I believe is due to her A-fib.  She states she has remained compliant with Xarelto.  She underwent DCCV on June 24, 2023 and went back into A-fib on June 25, 2023.  Continue Amiodarone, Digoxin, Toprol-XL, and Xarelto 20 mg daily.  Continue Xarelto for stroke prevention.  She denies any bleeding issues and is on appropriate dosage.  Heart healthy diet and regular cardiovascular exercise encouraged.  At next visit, plan to discuss repeating DCCV. Will obtain CBC, CMET, and digoxin level in 1 week. Care and ED  precautions discussed.   PAD, HLD, carotid artery stenosis Denies any  symptoms.  Asymptomatic left subclavian stenosis, bilateral 1 to 39% ICA stenosis.  LDL 24 06/2023.  Continue rosuvastatin.  Managed by PCP.  Continue to follow-up with Dr. Kirke Corin.   COPD Denies any recent symptoms.  Continue current medication regimen.  Continue follow-up with Dr. Sherene Sires.    Dispo: Will provide refills per her request. Follow-up with me or APP in 4 weeks or sooner if anything changes.  Signed, Sharlene Dory, NP

## 2023-07-02 ENCOUNTER — Telehealth: Payer: Self-pay | Admitting: Internal Medicine

## 2023-07-02 NOTE — Telephone Encounter (Signed)
Copied from CRM [id number]. Topic: General - Other >> Jul 01, 2023  3:24 PM Theodis Sato wrote:    Marylene Land from [facility] wants to let PT's doctor know that the PT would like to delay her started care date until the 07/08/2023 -  If there are any questions you can contact Marylene Land at  [phone]

## 2023-07-03 ENCOUNTER — Other Ambulatory Visit (HOSPITAL_COMMUNITY): Payer: Self-pay

## 2023-07-05 DIAGNOSIS — I4891 Unspecified atrial fibrillation: Secondary | ICD-10-CM | POA: Diagnosis not present

## 2023-07-05 DIAGNOSIS — I6523 Occlusion and stenosis of bilateral carotid arteries: Secondary | ICD-10-CM | POA: Diagnosis not present

## 2023-07-05 DIAGNOSIS — R0902 Hypoxemia: Secondary | ICD-10-CM | POA: Diagnosis not present

## 2023-07-06 LAB — COMPREHENSIVE METABOLIC PANEL
ALT: 35 [IU]/L — ABNORMAL HIGH (ref 0–32)
AST: 27 [IU]/L (ref 0–40)
Albumin: 3.7 g/dL — ABNORMAL LOW (ref 3.9–4.9)
Alkaline Phosphatase: 105 [IU]/L (ref 44–121)
BUN/Creatinine Ratio: 9 — ABNORMAL LOW (ref 12–28)
BUN: 8 mg/dL (ref 8–27)
Bilirubin Total: 0.4 mg/dL (ref 0.0–1.2)
CO2: 32 mmol/L — ABNORMAL HIGH (ref 20–29)
Calcium: 10.3 mg/dL (ref 8.7–10.3)
Chloride: 93 mmol/L — ABNORMAL LOW (ref 96–106)
Creatinine, Ser: 0.89 mg/dL (ref 0.57–1.00)
Globulin, Total: 3.1 g/dL (ref 1.5–4.5)
Glucose: 85 mg/dL (ref 70–99)
Potassium: 3.9 mmol/L (ref 3.5–5.2)
Sodium: 138 mmol/L (ref 134–144)
Total Protein: 6.8 g/dL (ref 6.0–8.5)
eGFR: 71 mL/min/{1.73_m2} (ref >59)

## 2023-07-06 LAB — CBC
Hematocrit: 46 % (ref 34.0–46.6)
Hemoglobin: 15.3 g/dL (ref 11.1–15.9)
MCH: 33.3 pg — ABNORMAL HIGH (ref 26.6–33.0)
MCHC: 33.3 g/dL (ref 31.5–35.7)
MCV: 100 fL — ABNORMAL HIGH (ref 79–97)
Platelets: 337 10*3/uL (ref 150–450)
RBC: 4.59 x10E6/uL (ref 3.77–5.28)
RDW: 14 % (ref 11.7–15.4)
WBC: 9.3 10*3/uL (ref 3.4–10.8)

## 2023-07-06 LAB — DIGOXIN LEVEL: Digoxin, Serum: 1.6 ng/mL — ABNORMAL HIGH (ref 0.5–0.9)

## 2023-07-08 ENCOUNTER — Ambulatory Visit: Payer: Self-pay | Admitting: Licensed Clinical Social Worker

## 2023-07-08 NOTE — Patient Outreach (Signed)
  Care Coordination   Follow Up Visit Note   07/08/2023 Name: Carrie Case MRN: [id number] DOB: 07/09/1956  Carrie Case is a 67 y.o. year old female who sees [name], [name], MD for primary care. I spoke with  [name] / Tonie Griffith , daughter of client, via phone today.  What matters to the patients health and wellness today? Patient is at risk fo falls. Patient is concerned over  Managing health needs    Goals Addressed             This Visit's Progress    patient is at risk for falls. Patient is concerned over managing health needs       Interventions:  Spoke with Tonie Griffith, daughter of client, via phone today about client needs Client was recently released from the hospital. Also, her brother passed away recently.  Funeral for client brother was held recently. Client has prescribed medications Daughter, Tonie Griffith provides client support with transport and other needs Discussed pain issues of client Client is short of breath occasionally. Client had recent UTI and received antibiotics for UTI Reviewed sleeping issues of client. Client often sleeps in a recliner at her home Discussed fall risk of client. LCSW encouraged Tonie Griffith to call RN af office of PCP to talk with RN about client fall risk to see if PCP had suggestion on ordering a device to help client walk safely. Client currenlty has no device to help with walking safely Reviewed program support for client with RN, LCSW and Pharmacist Discussed relaxation techniques: she likes to watch TV to relax. She likes to listen to music Talked of support for client from PCP, Dr. Christel Mormon.  Thanked Tonie Griffith for phone call with LCSW today Encouraged client or Tonie Griffith to call LCSW as needed for SW support for client at [phone].          SDOH assessments and interventions completed:  Yes  SDOH Interventions Today    Flowsheet Row Most Recent Value  SDOH Interventions    Depression Interventions/Treatment  Counseling  Physical Activity Interventions Other (Comments)  [challenges in walking]  Stress Interventions Other (Comment)  [needs help with ADLs, transport, meals. Stress in managing medical needs]        Care Coordination Interventions:  Yes, provided   Interventions Today    Flowsheet Row Most Recent Value  Chronic Disease   Chronic disease during today's visit Other  [spoke with Tonie Griffith, daughter, about client needs]  General Interventions   General Interventions Discussed/Reviewed General Interventions Discussed, Community Resources  Exercise Interventions   Exercise Discussed/Reviewed Physical Activity  Education Interventions   Education Provided Provided Education  Provided Verbal Education On Community Resources  Mental Health Interventions   Mental Health Discussed/Reviewed Coping Strategies  Pharmacy Interventions   Pharmacy Dicussed/Reviewed Pharmacy Topics Discussed  Safety Interventions   Safety Discussed/Reviewed Fall Risk        Follow up plan: Follow up call scheduled for 08/19/23 at 9:30 AM    Encounter Outcome:  Patient Visit Completed   Kelton Pillar.Chancellor Vanderloop MSW, LCSW [employer] [facility] Care Management [phone]

## 2023-07-08 NOTE — Patient Instructions (Signed)
Visit Information  Thank you for taking time to visit with me today. Please don't hesitate to contact me if I can be of assistance to you.   Following are the goals we discussed today:   Goals Addressed             This Visit's Progress    patient is at risk for falls. Patient is concerned over managing health needs       Interventions:  Spoke with Tonie Griffith, daughter of client, via phone today about client needs Client was recently released from the hospital. Also, her brother passed away recently.  Funeral for client brother was held recently. Client has prescribed medications Daughter, Tonie Griffith provides client support with transport and other needs Discussed pain issues of client Client is short of breath occasionally. Client had recent UTI and received antibiotics for UTI Reviewed sleeping issues of client. Client often sleeps in a recliner at her home Discussed fall risk of client. LCSW encouraged Tonie Griffith to call RN af office of PCP to talk with RN about client fall risk to see if PCP had suggestion on ordering a device to help client walk safely. Client currenlty has no device to help with walking safely Reviewed program support for client with RN, LCSW and Pharmacist Discussed relaxation techniques: she likes to watch TV to relax. She likes to listen to music Talked of support for client from PCP, Dr. Christel Mormon.  Thanked Tonie Griffith for phone call with LCSW today Encouraged client or Tonie Griffith to call LCSW as needed for SW support for client at [phone].          Our next appointment is by telephone on 08/19/23 at 9:30 AM   Please call the care guide team at [phone] if you need to cancel or reschedule your appointment.   If you are experiencing a Mental Health or Behavioral Health Crisis or need someone to talk to, please go to [facility] [address] [place] [phone])   The patient/  Tonie Griffith, daughter, verbalized understanding of instructions, educational materials, and care plan provided today and DECLINED offer to receive copy of patient instructions, educational materials, and care plan.   The patient/ Tonie Griffith, daughter,  has been provided with contact information for the care management team and has been advised to call with any health related questions or concerns.   Kelton Pillar.Cristan Hout MSW, LCSW Licensed Visual merchandiser [facility] Care Management [phone]

## 2023-07-09 DIAGNOSIS — J9601 Acute respiratory failure with hypoxia: Secondary | ICD-10-CM | POA: Diagnosis not present

## 2023-07-09 DIAGNOSIS — E785 Hyperlipidemia, unspecified: Secondary | ICD-10-CM | POA: Diagnosis not present

## 2023-07-09 DIAGNOSIS — I080 Rheumatic disorders of both mitral and aortic valves: Secondary | ICD-10-CM | POA: Diagnosis not present

## 2023-07-09 DIAGNOSIS — I5033 Acute on chronic diastolic (congestive) heart failure: Secondary | ICD-10-CM | POA: Diagnosis not present

## 2023-07-09 DIAGNOSIS — K219 Gastro-esophageal reflux disease without esophagitis: Secondary | ICD-10-CM | POA: Diagnosis not present

## 2023-07-09 DIAGNOSIS — J439 Emphysema, unspecified: Secondary | ICD-10-CM | POA: Diagnosis not present

## 2023-07-09 DIAGNOSIS — I4819 Other persistent atrial fibrillation: Secondary | ICD-10-CM | POA: Diagnosis not present

## 2023-07-09 DIAGNOSIS — E876 Hypokalemia: Secondary | ICD-10-CM | POA: Diagnosis not present

## 2023-07-09 DIAGNOSIS — I739 Peripheral vascular disease, unspecified: Secondary | ICD-10-CM | POA: Diagnosis not present

## 2023-07-12 DIAGNOSIS — I739 Peripheral vascular disease, unspecified: Secondary | ICD-10-CM | POA: Diagnosis not present

## 2023-07-12 DIAGNOSIS — E785 Hyperlipidemia, unspecified: Secondary | ICD-10-CM | POA: Diagnosis not present

## 2023-07-12 DIAGNOSIS — I080 Rheumatic disorders of both mitral and aortic valves: Secondary | ICD-10-CM | POA: Diagnosis not present

## 2023-07-12 DIAGNOSIS — K219 Gastro-esophageal reflux disease without esophagitis: Secondary | ICD-10-CM | POA: Diagnosis not present

## 2023-07-12 DIAGNOSIS — I4819 Other persistent atrial fibrillation: Secondary | ICD-10-CM | POA: Diagnosis not present

## 2023-07-12 DIAGNOSIS — E876 Hypokalemia: Secondary | ICD-10-CM | POA: Diagnosis not present

## 2023-07-12 DIAGNOSIS — J439 Emphysema, unspecified: Secondary | ICD-10-CM | POA: Diagnosis not present

## 2023-07-12 DIAGNOSIS — J9601 Acute respiratory failure with hypoxia: Secondary | ICD-10-CM | POA: Diagnosis not present

## 2023-07-12 DIAGNOSIS — I5033 Acute on chronic diastolic (congestive) heart failure: Secondary | ICD-10-CM | POA: Diagnosis not present

## 2023-07-15 ENCOUNTER — Other Ambulatory Visit: Payer: Self-pay

## 2023-07-15 ENCOUNTER — Telehealth: Payer: Self-pay

## 2023-07-15 NOTE — Telephone Encounter (Signed)
Copied from CRM [id number]. Topic: Clinical - Home Health Verbal Orders >> Jul 12, 2023  3:33 PM Deaijah H wrote: Caller/Agency: Romeo Apple Physical Therapist [facility] Callback Number: [phone] Service Requested: Physical Therapy Frequency:  freq 1 wk 1 / 0  wk 1 / 2 wk 3 - would like to make sure this is okay Any new concerns about the patient? No

## 2023-07-15 NOTE — Telephone Encounter (Signed)
Verbal orders provided

## 2023-07-16 ENCOUNTER — Other Ambulatory Visit: Payer: Self-pay

## 2023-07-16 ENCOUNTER — Encounter (HOSPITAL_COMMUNITY): Payer: Self-pay

## 2023-07-16 ENCOUNTER — Other Ambulatory Visit (HOSPITAL_COMMUNITY): Payer: Self-pay

## 2023-07-17 DIAGNOSIS — I739 Peripheral vascular disease, unspecified: Secondary | ICD-10-CM | POA: Diagnosis not present

## 2023-07-17 DIAGNOSIS — K219 Gastro-esophageal reflux disease without esophagitis: Secondary | ICD-10-CM | POA: Diagnosis not present

## 2023-07-17 DIAGNOSIS — E876 Hypokalemia: Secondary | ICD-10-CM | POA: Diagnosis not present

## 2023-07-17 DIAGNOSIS — I5033 Acute on chronic diastolic (congestive) heart failure: Secondary | ICD-10-CM | POA: Diagnosis not present

## 2023-07-17 DIAGNOSIS — E785 Hyperlipidemia, unspecified: Secondary | ICD-10-CM | POA: Diagnosis not present

## 2023-07-17 DIAGNOSIS — I4819 Other persistent atrial fibrillation: Secondary | ICD-10-CM | POA: Diagnosis not present

## 2023-07-17 DIAGNOSIS — I080 Rheumatic disorders of both mitral and aortic valves: Secondary | ICD-10-CM | POA: Diagnosis not present

## 2023-07-17 DIAGNOSIS — J9601 Acute respiratory failure with hypoxia: Secondary | ICD-10-CM | POA: Diagnosis not present

## 2023-07-17 DIAGNOSIS — J439 Emphysema, unspecified: Secondary | ICD-10-CM | POA: Diagnosis not present

## 2023-07-19 DIAGNOSIS — I080 Rheumatic disorders of both mitral and aortic valves: Secondary | ICD-10-CM | POA: Diagnosis not present

## 2023-07-19 DIAGNOSIS — J9601 Acute respiratory failure with hypoxia: Secondary | ICD-10-CM | POA: Diagnosis not present

## 2023-07-19 DIAGNOSIS — I5033 Acute on chronic diastolic (congestive) heart failure: Secondary | ICD-10-CM | POA: Diagnosis not present

## 2023-07-19 DIAGNOSIS — I739 Peripheral vascular disease, unspecified: Secondary | ICD-10-CM | POA: Diagnosis not present

## 2023-07-19 DIAGNOSIS — J439 Emphysema, unspecified: Secondary | ICD-10-CM | POA: Diagnosis not present

## 2023-07-19 DIAGNOSIS — I4819 Other persistent atrial fibrillation: Secondary | ICD-10-CM | POA: Diagnosis not present

## 2023-07-19 DIAGNOSIS — E785 Hyperlipidemia, unspecified: Secondary | ICD-10-CM | POA: Diagnosis not present

## 2023-07-19 DIAGNOSIS — E876 Hypokalemia: Secondary | ICD-10-CM | POA: Diagnosis not present

## 2023-07-19 DIAGNOSIS — K219 Gastro-esophageal reflux disease without esophagitis: Secondary | ICD-10-CM | POA: Diagnosis not present

## 2023-07-22 ENCOUNTER — Other Ambulatory Visit (HOSPITAL_COMMUNITY): Payer: Self-pay

## 2023-07-22 ENCOUNTER — Other Ambulatory Visit: Payer: Self-pay

## 2023-07-23 ENCOUNTER — Other Ambulatory Visit: Payer: Self-pay

## 2023-07-23 DIAGNOSIS — E876 Hypokalemia: Secondary | ICD-10-CM | POA: Diagnosis not present

## 2023-07-23 DIAGNOSIS — J439 Emphysema, unspecified: Secondary | ICD-10-CM | POA: Diagnosis not present

## 2023-07-23 DIAGNOSIS — I739 Peripheral vascular disease, unspecified: Secondary | ICD-10-CM | POA: Diagnosis not present

## 2023-07-23 DIAGNOSIS — I6529 Occlusion and stenosis of unspecified carotid artery: Secondary | ICD-10-CM

## 2023-07-23 DIAGNOSIS — I5033 Acute on chronic diastolic (congestive) heart failure: Secondary | ICD-10-CM | POA: Diagnosis not present

## 2023-07-23 DIAGNOSIS — I080 Rheumatic disorders of both mitral and aortic valves: Secondary | ICD-10-CM | POA: Diagnosis not present

## 2023-07-23 DIAGNOSIS — I4819 Other persistent atrial fibrillation: Secondary | ICD-10-CM | POA: Diagnosis not present

## 2023-07-23 DIAGNOSIS — E440 Moderate protein-calorie malnutrition: Secondary | ICD-10-CM

## 2023-07-23 DIAGNOSIS — F17210 Nicotine dependence, cigarettes, uncomplicated: Secondary | ICD-10-CM

## 2023-07-23 DIAGNOSIS — K219 Gastro-esophageal reflux disease without esophagitis: Secondary | ICD-10-CM | POA: Diagnosis not present

## 2023-07-23 DIAGNOSIS — E785 Hyperlipidemia, unspecified: Secondary | ICD-10-CM | POA: Diagnosis not present

## 2023-07-23 DIAGNOSIS — J9601 Acute respiratory failure with hypoxia: Secondary | ICD-10-CM | POA: Diagnosis not present

## 2023-07-24 DIAGNOSIS — I5033 Acute on chronic diastolic (congestive) heart failure: Secondary | ICD-10-CM | POA: Diagnosis not present

## 2023-07-24 DIAGNOSIS — E876 Hypokalemia: Secondary | ICD-10-CM | POA: Diagnosis not present

## 2023-07-24 DIAGNOSIS — I739 Peripheral vascular disease, unspecified: Secondary | ICD-10-CM | POA: Diagnosis not present

## 2023-07-24 DIAGNOSIS — K219 Gastro-esophageal reflux disease without esophagitis: Secondary | ICD-10-CM | POA: Diagnosis not present

## 2023-07-24 DIAGNOSIS — I080 Rheumatic disorders of both mitral and aortic valves: Secondary | ICD-10-CM | POA: Diagnosis not present

## 2023-07-24 DIAGNOSIS — E785 Hyperlipidemia, unspecified: Secondary | ICD-10-CM | POA: Diagnosis not present

## 2023-07-24 DIAGNOSIS — I4819 Other persistent atrial fibrillation: Secondary | ICD-10-CM | POA: Diagnosis not present

## 2023-07-24 DIAGNOSIS — J9601 Acute respiratory failure with hypoxia: Secondary | ICD-10-CM | POA: Diagnosis not present

## 2023-07-24 DIAGNOSIS — J439 Emphysema, unspecified: Secondary | ICD-10-CM | POA: Diagnosis not present

## 2023-07-27 ENCOUNTER — Encounter (HOSPITAL_COMMUNITY): Payer: Self-pay

## 2023-07-29 ENCOUNTER — Other Ambulatory Visit (HOSPITAL_COMMUNITY): Payer: Self-pay

## 2023-07-29 ENCOUNTER — Other Ambulatory Visit: Payer: Self-pay

## 2023-07-29 ENCOUNTER — Other Ambulatory Visit (HOSPITAL_BASED_OUTPATIENT_CLINIC_OR_DEPARTMENT_OTHER): Payer: Self-pay

## 2023-07-30 DIAGNOSIS — E876 Hypokalemia: Secondary | ICD-10-CM | POA: Diagnosis not present

## 2023-07-30 DIAGNOSIS — I5033 Acute on chronic diastolic (congestive) heart failure: Secondary | ICD-10-CM | POA: Diagnosis not present

## 2023-07-30 DIAGNOSIS — K219 Gastro-esophageal reflux disease without esophagitis: Secondary | ICD-10-CM | POA: Diagnosis not present

## 2023-07-30 DIAGNOSIS — J9601 Acute respiratory failure with hypoxia: Secondary | ICD-10-CM | POA: Diagnosis not present

## 2023-07-30 DIAGNOSIS — J439 Emphysema, unspecified: Secondary | ICD-10-CM | POA: Diagnosis not present

## 2023-07-30 DIAGNOSIS — I739 Peripheral vascular disease, unspecified: Secondary | ICD-10-CM | POA: Diagnosis not present

## 2023-07-30 DIAGNOSIS — E785 Hyperlipidemia, unspecified: Secondary | ICD-10-CM | POA: Diagnosis not present

## 2023-07-30 DIAGNOSIS — I080 Rheumatic disorders of both mitral and aortic valves: Secondary | ICD-10-CM | POA: Diagnosis not present

## 2023-07-30 DIAGNOSIS — I4819 Other persistent atrial fibrillation: Secondary | ICD-10-CM | POA: Diagnosis not present

## 2023-07-31 DIAGNOSIS — E876 Hypokalemia: Secondary | ICD-10-CM | POA: Diagnosis not present

## 2023-07-31 DIAGNOSIS — J9601 Acute respiratory failure with hypoxia: Secondary | ICD-10-CM | POA: Diagnosis not present

## 2023-07-31 DIAGNOSIS — J439 Emphysema, unspecified: Secondary | ICD-10-CM | POA: Diagnosis not present

## 2023-07-31 DIAGNOSIS — K219 Gastro-esophageal reflux disease without esophagitis: Secondary | ICD-10-CM | POA: Diagnosis not present

## 2023-07-31 DIAGNOSIS — I080 Rheumatic disorders of both mitral and aortic valves: Secondary | ICD-10-CM | POA: Diagnosis not present

## 2023-07-31 DIAGNOSIS — E785 Hyperlipidemia, unspecified: Secondary | ICD-10-CM | POA: Diagnosis not present

## 2023-07-31 DIAGNOSIS — I4819 Other persistent atrial fibrillation: Secondary | ICD-10-CM | POA: Diagnosis not present

## 2023-07-31 DIAGNOSIS — I5033 Acute on chronic diastolic (congestive) heart failure: Secondary | ICD-10-CM | POA: Diagnosis not present

## 2023-07-31 DIAGNOSIS — I739 Peripheral vascular disease, unspecified: Secondary | ICD-10-CM | POA: Diagnosis not present

## 2023-08-01 ENCOUNTER — Other Ambulatory Visit: Payer: Self-pay

## 2023-08-02 ENCOUNTER — Other Ambulatory Visit (HOSPITAL_COMMUNITY): Payer: Self-pay

## 2023-08-02 ENCOUNTER — Other Ambulatory Visit: Payer: Self-pay

## 2023-08-05 ENCOUNTER — Ambulatory Visit: Payer: Medicare HMO | Attending: Nurse Practitioner | Admitting: Nurse Practitioner

## 2023-08-05 ENCOUNTER — Ambulatory Visit (INDEPENDENT_AMBULATORY_CARE_PROVIDER_SITE_OTHER): Payer: Medicare HMO | Admitting: Internal Medicine

## 2023-08-05 ENCOUNTER — Encounter: Payer: Self-pay | Admitting: Internal Medicine

## 2023-08-05 ENCOUNTER — Encounter: Payer: Self-pay | Admitting: Nurse Practitioner

## 2023-08-05 VITALS — BP 116/60 | HR 68 | Ht 65.0 in | Wt 121.0 lb

## 2023-08-05 VITALS — BP 91/59 | HR 62 | Ht 66.0 in | Wt 121.0 lb

## 2023-08-05 DIAGNOSIS — I739 Peripheral vascular disease, unspecified: Secondary | ICD-10-CM | POA: Diagnosis not present

## 2023-08-05 DIAGNOSIS — E785 Hyperlipidemia, unspecified: Secondary | ICD-10-CM | POA: Diagnosis not present

## 2023-08-05 DIAGNOSIS — M810 Age-related osteoporosis without current pathological fracture: Secondary | ICD-10-CM

## 2023-08-05 DIAGNOSIS — I5030 Unspecified diastolic (congestive) heart failure: Secondary | ICD-10-CM

## 2023-08-05 DIAGNOSIS — J449 Chronic obstructive pulmonary disease, unspecified: Secondary | ICD-10-CM | POA: Diagnosis not present

## 2023-08-05 DIAGNOSIS — Z789 Other specified health status: Secondary | ICD-10-CM | POA: Diagnosis not present

## 2023-08-05 DIAGNOSIS — R5383 Other fatigue: Secondary | ICD-10-CM

## 2023-08-05 DIAGNOSIS — E782 Mixed hyperlipidemia: Secondary | ICD-10-CM | POA: Diagnosis not present

## 2023-08-05 DIAGNOSIS — I480 Paroxysmal atrial fibrillation: Secondary | ICD-10-CM

## 2023-08-05 DIAGNOSIS — I4891 Unspecified atrial fibrillation: Secondary | ICD-10-CM

## 2023-08-05 DIAGNOSIS — I6523 Occlusion and stenosis of bilateral carotid arteries: Secondary | ICD-10-CM | POA: Diagnosis not present

## 2023-08-05 MED ORDER — ALBUTEROL SULFATE HFA 108 (90 BASE) MCG/ACT IN AERS
2.0000 | INHALATION_SPRAY | Freq: Four times a day (QID) | RESPIRATORY_TRACT | 0 refills | Status: DC | PRN
  Filled 2023-08-05: qty 6.7, 25d supply, fill #0
  Filled 2024-06-09 – 2024-06-11 (×2): qty 6.7, 25d supply, fill #1

## 2023-08-05 MED ORDER — PROLIA 60 MG/ML ~~LOC~~ SOSY
60.0000 mg | PREFILLED_SYRINGE | SUBCUTANEOUS | 0 refills | Status: AC
  Filled 2023-08-05 – 2023-08-06 (×2): qty 1, 180d supply, fill #0

## 2023-08-05 NOTE — Progress Notes (Signed)
Cardiology Office Note:  .   Date:  08/05/2023 ID:  Carrie Case, DOB May 05, 1956, MRN [id number] PCP: Billie Lade, MD  [facility] Providers Cardiologist:  Dina Rich, MD PV Cardiologist:  [name], MD    History of Present Illness: .   Carrie Case is a 67 y.o. female with a PMH of chronic HFpEF, A-fib, PAD, HLD, carotid artery stenosis, tobacco use, and COPD, who presents today for scheduled follow-up.    06/03/2023 - Today she presents for follow-up. Patient has main chief concern/complaint is fatigue, and says she has lost her get up and go.  Says "I do not like being a couch potato.  I sit on the couch 24/7."  Says she has missed maybe 1 dose of Xarelto in the past 6 weeks.  Also admits to constant headaches, and intermittent bleeding noted in her ear canals.  Denies any migraine symptoms/red flag symptoms.  Says she has also been evaluated by PCP regarding bleeding in her ear canals, says she was told that this may be swimmer's ear, but says she knows this is not swimmer's ear.  Denies any acute vision changes or strokelike symptoms. Denies any chest pain, shortness of breath, palpitations, syncope, presyncope, dizziness, orthopnea, PND, swelling or significant weight changes, acute bleeding, or claudication.  She states she has had 1 fall in the past 6 months, denied any syncope related to event.  Stated "I just fell."  Says this happens sometimes, not as often as it used to.  Says this is due to possibly feeling off balance.  Admits to insomnia, has been on multiple medications that do not seem to work.  Admits that she takes a friend's Xanax prescription that helps her poor sleep-says PCP is also aware of this.  Presented to [facility] on June 21, 2023 for A-fib with RVR, CHF, pneumonia, UTI, she was transferred to [facility] for further care.  BNP 746.  AST 109, ALT 52.  She was placed on amiodarone drip and received Lasix.  Underwent DCCV on  June 24, 2023.  The following day she went back to A-fib with RVR.  Also received 2 doses of digoxin, was restarted on Toprol and also on Cardizem CD3 100 mg daily at baseline.  Updated echo revealed EF 55 to 60%, mild to moderate AR, mild LVH and preserved RV function.  It was felt that CHF exacerbation was likely related to A-fib with RVR.  Was treated with antibiotics for CAP.  Last seen for follow-up on July 01, 2023. Was doing well, however continued to note fatigue. Denied any chest pain, shortness of breath, palpitations, syncope, presyncope, dizziness, orthopnea, PND, swelling or significant weight changes, acute bleeding, or claudication.   Today she presents for follow-up with her daughter. Doing well. Continues to note fatigue as her main concern. Denies any chest pain, shortness of breath, palpitations, syncope, presyncope, dizziness, orthopnea, PND, swelling or significant weight changes, acute bleeding, or claudication.   ROS: Negative. Seen HPI.   Studies Reviewed: Marland Kitchen    EKG:  EKG Interpretation Date/Time:  Monday August 05 2023 15:00:30 EST Ventricular Rate:  52 PR Interval:  128 QRS Duration:  94 QT Interval:  408 QTC Calculation: 379 R Axis:   90  Text Interpretation: Sinus bradycardia Rightward axis When compared with ECG of 24-Jun-2023 10:42, Premature atrial complexes are no longer Present Vent. rate has decreased BY  37 BPM QT has shortened Confirmed by Sharlene Dory [id number] on 08/05/2023 3:06:26  PM   Echo 06/2023: 1. Left ventricular ejection fraction, by estimation, is 55 to 60%. The  left ventricle has normal function. The left ventricle has no regional  wall motion abnormalities. There is mild left ventricular hypertrophy.  Left ventricular diastolic function  could not be evaluated.   2. Right ventricular systolic function is normal. The right ventricular  size is normal. There is moderately elevated pulmonary artery systolic  pressure.   3. Left  atrial size was moderately dilated.   4. Right atrial size was moderately dilated.   5. The mitral valve is normal in structure. Trivial mitral valve  regurgitation. No evidence of mitral stenosis. Moderate mitral annular  calcification.   6. The aortic valve is tricuspid. Aortic valve regurgitation is mild to  moderate. Aortic valve sclerosis/calcification is present, without any  evidence of aortic stenosis.   7. The inferior vena cava is normal in size with greater than 50%  respiratory variability, suggesting right atrial pressure of 3 mmHg.  Echo 10/2022:  1. Left ventricular ejection fraction, by estimation, is 55 to 60%. The  left ventricle has normal function. The left ventricle has no regional  wall motion abnormalities. There is mild concentric left ventricular  hypertrophy. Left ventricular diastolic  parameters are indeterminate. The average left ventricular global  longitudinal strain is -19.6 %. The global longitudinal strain is normal.   2. Right ventricular systolic function is low normal. The right  ventricular size is normal. There is normal pulmonary artery systolic  pressure. The estimated right ventricular systolic pressure is 35.7 mmHg.   3. Left atrial size was severely dilated.   4. Right atrial size was moderately dilated.   5. The mitral valve is degenerative. Mild mitral valve regurgitation.  Moderate mitral annular calcification.   6. The aortic valve is tricuspid. There is mild calcification of the  aortic valve. Aortic valve regurgitation is mild. Aortic valve sclerosis  is present, with no evidence of aortic valve stenosis. Aortic  regurgitation PHT measures 560 msec.   7. The inferior vena cava is normal in size with greater than 50%  respiratory variability, suggesting right atrial pressure of 3 mmHg.   Comparison(s): Prior images reviewed side by side. LVEF remains normal  range at 55-60%.   Risk Assessment/Calculations:    CHA2DS2-VASc Score = 3   This indicates a 3.2% annual risk of stroke. The patient's score is based upon: CHF History: 0 HTN History: 0 Diabetes History: 0 Stroke History: 0 Vascular Disease History: 1 Age Score: 1 Gender Score: 1   Physical Exam:   VS:  BP 116/60   Pulse 68   Ht 5\' 5"  (1.651 m)   Wt 121 lb (54.9 kg)   SpO2 98%   BMI 20.14 kg/m    Wt Readings from Last 3 Encounters:  08/05/23 121 lb (54.9 kg)  07/01/23 124 lb 6.4 oz (56.4 kg)  06/27/23 122 lb 12.7 oz (55.7 kg)    GEN: Well nourished, well developed in no acute distress NECK: No JVD; No carotid bruits CARDIAC: S1/S2, RRR, no murmurs, rubs, gallops RESPIRATORY:  Clear to auscultation without rales, wheezing or rhonchi  ABDOMEN: Soft, non-tender, non-distended EXTREMITIES:  No edema; No deformity   ASSESSMENT AND PLAN: .    HFpEF Stage C, NYHA class I-II symptoms. EF 55-60% 06/2023. Euvolemic and well compensated on exam. GDMT limited d/t her BP trends. Continue current medication regimen. Low sodium diet, fluid restriction <2L, and daily weights encouraged. Educated to contact our office  for weight gain of 2 lbs overnight or 5 lbs in one week.  PAF, fatigue Denies any palpitations or tachycardia. EKG reveals SB.  Continue Amiodarone, Digoxin, Toprol-XL, and Xarelto 20 mg daily.  Continue Xarelto for stroke prevention.  She denies any bleeding issues and is on appropriate dosage.  Heart healthy diet and regular cardiovascular exercise encouraged. Care and ED precautions discussed.   PAD, HLD, carotid artery stenosis Denies any  symptoms.  Asymptomatic left subclavian stenosis, bilateral 1 to 39% ICA stenosis.  LDL 24 06/2023.  Continue rosuvastatin.  Managed by PCP.  Continue to follow-up with Dr. Kirke Corin.   COPD Denies any recent symptoms.  Continue current medication regimen.  Continue follow-up with Dr. Sherene Sires.    Dispo: Follow-up with me or APP in 2-3 months or sooner if anything changes.  Signed, Sharlene Dory, NP

## 2023-08-05 NOTE — Patient Instructions (Signed)
It was a pleasure to see you today.  Thank you for giving [place] the opportunity to be involved in your care.  Below is a brief recap of your visit and next steps.  We will plan to see you again in 3 months.  Summary Start Prolia injections for osteoporosis Albuterol refilled Follow up in 3 months

## 2023-08-05 NOTE — Patient Instructions (Addendum)

## 2023-08-05 NOTE — Progress Notes (Unsigned)
Established Patient Office Visit  Subjective   Patient ID: Carrie Case, female    DOB: [birth year]  Age: 67 y.o. MRN: [id number]  Chief Complaint  Patient presents with   Follow-up    HPI  Past Medical History:  Diagnosis Date   A-fib [facility])    Acute respiratory failure with hypoxia (HCC) 12/22/2018   Carotid artery stenosis    CHF (congestive heart failure) (HCC)    COPD (chronic obstructive pulmonary disease) (HCC)    History of home oxygen therapy    HLD (hyperlipidemia)    Memory changes    Osteoporosis    PAD (peripheral artery disease) (HCC)    Recurrent falls    Tobacco dependence    Past Surgical History:  Procedure Laterality Date   CARDIOVERSION N/A 06/24/2023   Procedure: CARDIOVERSION (CATH LAB);  Surgeon: Little Ishikawa, MD;  Location: [facility] INVASIVE CV LAB;  Service: Cardiovascular;  Laterality: N/A;   TUBAL LIGATION     Social History   Tobacco Use   Smoking status: Every Day    Current packs/day: 0.50    Average packs/day: 0.5 packs/day for 47.0 years (23.5 ttl pk-yrs)    Types: Cigarettes    Passive exposure: Never   Smokeless tobacco: Never   Tobacco comments:    Smoking about 1/2 ppd per patient on 11/01/2021  Vaping Use   Vaping status: Never Used  Substance Use Topics   Alcohol use: No   Drug use: No   Family History  Problem Relation Age of Onset   Hypertension Mother    Hyperlipidemia Mother    Cancer Mother    COPD Mother    Anxiety disorder Mother    Allergies  Allergen Reactions   Ibuprofen Hives   ROS    Objective:     BP (!) 91/59   Pulse 62   Ht 5\' 6"  (1.676 m)   Wt 121 lb (54.9 kg)   BMI 19.53 kg/m  BP Readings from Last 3 Encounters:  08/05/23 (!) 91/59  08/05/23 116/60  07/01/23 120/70   Physical Exam  Last CBC Lab Results  Component Value Date   WBC 9.3 07/05/2023   HGB 15.3 07/05/2023   HCT 46.0 07/05/2023   MCV 100 (H) 07/05/2023   MCH 33.3 (H) 07/05/2023   RDW 14.0 07/05/2023   PLT  337 07/05/2023   Last metabolic panel Lab Results  Component Value Date   GLUCOSE 85 07/05/2023   NA 138 07/05/2023   K 3.9 07/05/2023   CL 93 (L) 07/05/2023   CO2 32 (H) 07/05/2023   BUN 8 07/05/2023   CREATININE 0.89 07/05/2023   EGFR 71 07/05/2023   CALCIUM 10.3 07/05/2023   PHOS 2.7 06/27/2023   PROT 6.8 07/05/2023   ALBUMIN 3.7 (L) 07/05/2023   LABGLOB 3.1 07/05/2023   AGRATIO 1.1 (L) 06/18/2022   BILITOT 0.4 07/05/2023   ALKPHOS 105 07/05/2023   AST 27 07/05/2023   ALT 35 (H) 07/05/2023   ANIONGAP 10 06/27/2023   Last lipids Lab Results  Component Value Date   CHOL 59 06/22/2023   HDL 20 (L) 06/22/2023   LDLCALC 24 06/22/2023   TRIG 75 06/22/2023   CHOLHDL 3.0 06/22/2023   Last hemoglobin A1c Lab Results  Component Value Date   HGBA1C 5.6 05/21/2022   Last thyroid functions Lab Results  Component Value Date   TSH 0.905 06/22/2023   T4TOTAL 6.3 02/14/2022   Last vitamin D Lab Results  Component Value  Date   VD25OH 60.3 08/16/2022      Assessment & Plan:   Problem List Items Addressed This Visit   None   No follow-ups on file.    Billie Lade, MD

## 2023-08-06 ENCOUNTER — Other Ambulatory Visit (HOSPITAL_COMMUNITY): Payer: Self-pay

## 2023-08-06 ENCOUNTER — Encounter (HOSPITAL_COMMUNITY): Payer: Self-pay

## 2023-08-06 ENCOUNTER — Other Ambulatory Visit (HOSPITAL_COMMUNITY): Payer: Self-pay | Admitting: Pharmacy Technician

## 2023-08-06 ENCOUNTER — Other Ambulatory Visit: Payer: Self-pay

## 2023-08-06 NOTE — Progress Notes (Signed)
Specialty Pharmacy Initial Fill Coordination Note  Carrie Case is a 67 y.o. female contacted today regarding initial fill of specialty medication(s) Denosumab (Prolia)   Patient requested Courier to Provider Office   Delivery date: 08/08/23   Verified address: [facility] [address]   Medication will be filled on 12/18.   Patient is aware of $0 copayment.

## 2023-08-07 ENCOUNTER — Other Ambulatory Visit: Payer: Self-pay

## 2023-08-07 DIAGNOSIS — J9601 Acute respiratory failure with hypoxia: Secondary | ICD-10-CM | POA: Diagnosis not present

## 2023-08-07 DIAGNOSIS — K219 Gastro-esophageal reflux disease without esophagitis: Secondary | ICD-10-CM | POA: Diagnosis not present

## 2023-08-07 DIAGNOSIS — I5033 Acute on chronic diastolic (congestive) heart failure: Secondary | ICD-10-CM | POA: Diagnosis not present

## 2023-08-07 DIAGNOSIS — E876 Hypokalemia: Secondary | ICD-10-CM | POA: Diagnosis not present

## 2023-08-07 DIAGNOSIS — J439 Emphysema, unspecified: Secondary | ICD-10-CM | POA: Diagnosis not present

## 2023-08-07 DIAGNOSIS — I739 Peripheral vascular disease, unspecified: Secondary | ICD-10-CM | POA: Diagnosis not present

## 2023-08-07 DIAGNOSIS — I4819 Other persistent atrial fibrillation: Secondary | ICD-10-CM | POA: Diagnosis not present

## 2023-08-07 DIAGNOSIS — I080 Rheumatic disorders of both mitral and aortic valves: Secondary | ICD-10-CM | POA: Diagnosis not present

## 2023-08-07 DIAGNOSIS — E785 Hyperlipidemia, unspecified: Secondary | ICD-10-CM | POA: Diagnosis not present

## 2023-08-09 ENCOUNTER — Other Ambulatory Visit: Payer: Self-pay

## 2023-08-13 ENCOUNTER — Other Ambulatory Visit: Payer: Self-pay

## 2023-08-13 ENCOUNTER — Other Ambulatory Visit (HOSPITAL_BASED_OUTPATIENT_CLINIC_OR_DEPARTMENT_OTHER): Payer: Self-pay

## 2023-08-13 ENCOUNTER — Other Ambulatory Visit (HOSPITAL_COMMUNITY): Payer: Self-pay

## 2023-08-15 ENCOUNTER — Telehealth: Payer: Self-pay

## 2023-08-15 NOTE — Telephone Encounter (Signed)
Attempted to call daughter, mail box full

## 2023-08-15 NOTE — Telephone Encounter (Signed)
Copied from CRM [id number]. Topic: Clinical - Medication Question >> Aug 15, 2023  3:47 PM Carlatta H wrote: Reason for CRM: Carrie Case (daughter) [phone] called about injection//Prescription given on 12/16 and mailed to the office//She wants to know what has to be done for the injections//

## 2023-08-18 ENCOUNTER — Other Ambulatory Visit (HOSPITAL_COMMUNITY): Payer: Self-pay

## 2023-08-19 ENCOUNTER — Ambulatory Visit: Payer: Self-pay | Admitting: Licensed Clinical Social Worker

## 2023-08-19 NOTE — Patient Outreach (Signed)
  Care Coordination   Follow Up Visit Note   08/19/2023 Name: Carrie Case MRN: [id number] DOB: [birth year]  Carrie Case is a 67 y.o. year old female who sees [name], [name], MD for primary care. I spoke with  Carrie Case by phone today.  What matters to the patients health and wellness today? patient is at risk for falls. Patient is concerned over managing health needs    Goals Addressed             This Visit's Progress    patient is at risk for falls. Patient is concerned over managing health needs       Interventions:  Spoke with client via phone today about client needs Client said she receives physical therapy sessions as scheduled in the home. She said physical therapy sessions for client are scheduled to end this week Spoke with client about medication procurement Discussed support for client with PCP Discussed client support from client daughter, Carrie Case. Discussed pain issues of client Discussed transport needs of client. She said her daughter helps transport client to and from client appointments. Reminded client of [facility] transport services in [place], [place] Provided counseling support for client Reviewed sleeping issues of client. Client said she has decreased sleep Discussed fall risk of client. LCSW encouraged  client to talk with physical therapist to see if physical therapist recommended any walking device to assist client in walking. Client said she would talk with physical therapist this week about walking device suggestions for client. Reviewed program support for client with RN, LCSW and Pharmacist Discussed relaxation techniques: she likes to watch TV to relax. She likes to listen to music Thanked client for phone call with LCSW today Encouraged client or Carrie Case to call LCSW as needed for SW support for client at [phone].            SDOH assessments and interventions completed:  Yes  SDOH Interventions Today     Flowsheet Row Most Recent Value  SDOH Interventions   Depression Interventions/Treatment  Counseling  Physical Activity Interventions Other (Comments)  [receives physical therapy support in the home as scheduled]  Stress Interventions Provide Counseling        Care Coordination Interventions:  Yes, provided   Interventions Today    Flowsheet Row Most Recent Value  Chronic Disease   Chronic disease during today's visit Other  [spoke with client about client needs]  General Interventions   General Interventions Discussed/Reviewed General Interventions Discussed, Community Resources  Education Interventions   Education Provided Provided Education  Provided Verbal Education On [employer]  Mental Health Interventions   Mental Health Discussed/Reviewed Coping Strategies  [client is at risk for falls]  Nutrition Interventions   Nutrition Discussed/Reviewed Nutrition Discussed  [decreased appetite]  Pharmacy Interventions   Pharmacy Dicussed/Reviewed Pharmacy Topics Discussed  Safety Interventions   Safety Discussed/Reviewed Fall Risk       Follow up plan: Follow up call scheduled for 10/15/23 at 2:30 PM     Encounter Outcome:  Patient Visit Completed   Kelton Pillar.Dalylah Ramey MSW, LCSW Licensed Visual merchandiser [facility] Care Management [phone]

## 2023-08-19 NOTE — Patient Instructions (Signed)
Visit Information  Thank you for taking time to visit with me today. Please don't hesitate to contact me if I can be of assistance to you.   Following are the goals we discussed today:   Goals Addressed             This Visit's Progress    patient is at risk for falls. Patient is concerned over managing health needs       Interventions:  Spoke with client via phone today about client needs Client said she receives physical therapy sessions as scheduled in the home. She said physical therapy sessions for client are scheduled to end this week Spoke with client about medication procurement Discussed support for client with PCP Discussed client support from client daughter, Tonie Griffith. Discussed pain issues of client Discussed transport needs of client. She said her daughter helps transport client to and from client appointments. Reminded client of [facility] transport services in [place], [place] Provided counseling support for client Reviewed sleeping issues of client. Client said she has decreased sleep Discussed fall risk of client. LCSW encouraged  client to talk with physical therapist to see if physical therapist recommended any walking device to assist client in walking. Client said she would talk with physical therapist this week about walking device suggestions for client. Reviewed program support for client with RN, LCSW and Pharmacist Discussed relaxation techniques: she likes to watch TV to relax. She likes to listen to music Thanked client for phone call with LCSW today Encouraged client or Tonie Griffith to call LCSW as needed for SW support for client at [phone].            Our next appointment is by telephone on 10/15/23 at 2:30 PM   Please call the care guide team at [phone] if you need to cancel or reschedule your appointment.   If you are experiencing a Mental Health or Behavioral Health Crisis or need someone to talk to, please go to [facility] [address], [place] [phone])   The patient verbalized understanding of instructions, educational materials, and care plan provided today and DECLINED offer to receive copy of patient instructions, educational materials, and care plan.   The patient has been provided with contact information for the care management team and has been advised to call with any health related questions or concerns.   Kelton Pillar.Elnoria Livingston MSW, LCSW Licensed Visual merchandiser [facility] Care Management [phone]

## 2023-08-26 NOTE — Assessment & Plan Note (Addendum)
 Asymptomatic currently.  Pulmonary exam is unremarkable.  She remains on Stiolto.  No changes are indicated today.  Albuterol refilled.

## 2023-08-26 NOTE — Assessment & Plan Note (Signed)
 T-score -2.7 on DEXA from October 2023.  She was initially treated with Fosamax .  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  Currently taking Pepcid  and Protonix . -Additional treatment options for management of osteoporosis were reviewed today.  Through shared decision making, semiannual Prolia  injections were prescribed.

## 2023-08-26 NOTE — Assessment & Plan Note (Signed)
 Regular rate and rhythm detected on exam today.  Seen earlier today by cardiology for follow-up.  Hospital admission last month in the setting of A-fib with RVR.  She is currently prescribed amiodarone, digoxin, Toprol-XL, and Xarelto.

## 2023-08-26 NOTE — Assessment & Plan Note (Signed)
 Lipid panel updated last month.  Total cholesterol 59 and LDL 24.  She remains on rosuvastatin 20 mg daily.

## 2023-08-26 NOTE — Telephone Encounter (Signed)
 Copied from CRM [id number]. Topic: Clinical - Medical Advice >> Aug 23, 2023 12:07 PM [name] wrote: Reason for CRM: Called from [facility] to let Patient PCP know Carrie Case was not seen last week and that this upcoming week is more than likely her discharge week. Was told just to let the PCP aware of this.

## 2023-08-29 DIAGNOSIS — I080 Rheumatic disorders of both mitral and aortic valves: Secondary | ICD-10-CM | POA: Diagnosis not present

## 2023-08-29 DIAGNOSIS — J9601 Acute respiratory failure with hypoxia: Secondary | ICD-10-CM | POA: Diagnosis not present

## 2023-08-29 DIAGNOSIS — I5033 Acute on chronic diastolic (congestive) heart failure: Secondary | ICD-10-CM | POA: Diagnosis not present

## 2023-08-29 DIAGNOSIS — E785 Hyperlipidemia, unspecified: Secondary | ICD-10-CM | POA: Diagnosis not present

## 2023-08-29 DIAGNOSIS — I739 Peripheral vascular disease, unspecified: Secondary | ICD-10-CM | POA: Diagnosis not present

## 2023-08-29 DIAGNOSIS — F17210 Nicotine dependence, cigarettes, uncomplicated: Secondary | ICD-10-CM | POA: Diagnosis not present

## 2023-08-29 DIAGNOSIS — E440 Moderate protein-calorie malnutrition: Secondary | ICD-10-CM | POA: Diagnosis not present

## 2023-08-29 DIAGNOSIS — I6529 Occlusion and stenosis of unspecified carotid artery: Secondary | ICD-10-CM | POA: Diagnosis not present

## 2023-08-29 DIAGNOSIS — I4819 Other persistent atrial fibrillation: Secondary | ICD-10-CM | POA: Diagnosis not present

## 2023-08-29 DIAGNOSIS — J439 Emphysema, unspecified: Secondary | ICD-10-CM | POA: Diagnosis not present

## 2023-08-29 DIAGNOSIS — I10 Essential (primary) hypertension: Secondary | ICD-10-CM | POA: Diagnosis not present

## 2023-08-29 DIAGNOSIS — K219 Gastro-esophageal reflux disease without esophagitis: Secondary | ICD-10-CM | POA: Diagnosis not present

## 2023-08-29 DIAGNOSIS — Z9181 History of falling: Secondary | ICD-10-CM | POA: Diagnosis not present

## 2023-08-29 DIAGNOSIS — E876 Hypokalemia: Secondary | ICD-10-CM | POA: Diagnosis not present

## 2023-08-29 DIAGNOSIS — Z7901 Long term (current) use of anticoagulants: Secondary | ICD-10-CM | POA: Diagnosis not present

## 2023-08-30 ENCOUNTER — Other Ambulatory Visit (HOSPITAL_COMMUNITY): Payer: Self-pay

## 2023-09-02 ENCOUNTER — Telehealth: Payer: Self-pay

## 2023-09-02 NOTE — Telephone Encounter (Signed)
 Copied from CRM [id number]. Topic: Clinical - Home Health Verbal Orders >> Aug 29, 2023 11:50 AM Graeme ORN wrote: Caller/Agency: [facility] with [facility] Number: [phone] Service Requested: N/A - No service requested. Caller providing update. Patient seen today. 1st appt in several weeks. This was a discharge appt. Vitals are normal. Blood pressure 118/64. Only concern is minor lightheadedness. Patient does take at home readings which caller encouraged and also encourage patient to stay hydrated to see if that helps and follow up with provider.  Frequency: N/A Any new concerns about the patient? Yes - lightheaded

## 2023-09-05 ENCOUNTER — Other Ambulatory Visit: Payer: Self-pay | Admitting: Internal Medicine

## 2023-09-05 ENCOUNTER — Other Ambulatory Visit (HOSPITAL_COMMUNITY): Payer: Self-pay

## 2023-09-05 NOTE — Telephone Encounter (Signed)
Copied from CRM [id number]. Topic: General - Billing Inquiry >> Sep 05, 2023 11:58 AM Monisha R wrote: Reason for CRM: MyChart shows $150 charge and stated she can make in 2 payments. Daughter stated that she had [facility] so wondering why she has out dated.

## 2023-09-09 ENCOUNTER — Other Ambulatory Visit: Payer: Self-pay

## 2023-09-09 ENCOUNTER — Other Ambulatory Visit (HOSPITAL_COMMUNITY): Payer: Self-pay

## 2023-09-09 MED ORDER — PANTOPRAZOLE SODIUM 40 MG PO TBEC
40.0000 mg | DELAYED_RELEASE_TABLET | Freq: Every day | ORAL | 0 refills | Status: DC
  Filled 2023-09-09: qty 30, 30d supply, fill #0

## 2023-09-10 ENCOUNTER — Ambulatory Visit: Payer: 59 | Admitting: Internal Medicine

## 2023-09-26 ENCOUNTER — Other Ambulatory Visit: Payer: Self-pay

## 2023-09-26 ENCOUNTER — Other Ambulatory Visit (HOSPITAL_COMMUNITY): Payer: Self-pay

## 2023-10-02 ENCOUNTER — Other Ambulatory Visit: Payer: Self-pay | Admitting: Internal Medicine

## 2023-10-02 ENCOUNTER — Other Ambulatory Visit (HOSPITAL_COMMUNITY): Payer: Self-pay

## 2023-10-02 ENCOUNTER — Other Ambulatory Visit: Payer: Self-pay

## 2023-10-02 MED ORDER — PANTOPRAZOLE SODIUM 40 MG PO TBEC
40.0000 mg | DELAYED_RELEASE_TABLET | Freq: Every day | ORAL | 0 refills | Status: DC
  Filled 2023-10-02: qty 30, 30d supply, fill #0

## 2023-10-12 NOTE — Progress Notes (Unsigned)
 Carrie Case, female    DOB: [birth year]  MRN: [id number]   Brief patient profile:  45   yowf  active smoker  referred to pulmonary clinic in [place]  11/01/2021 by Dr Dolphus Jenny for doe.  Proved to have GOLD 2 COPD criteria 02/2022    History of Present Illness  11/01/2021  Pulmonary/ 1st office eval/ Carrie Case / [place] [facility]  Chief Complaint  Patient presents with   Consult    Referred by Dr. [name] for Chronic bronchitis and COPD   Dyspnea:  MMRC2 = can't walk a nl pace on a flat grade s sob but does fine slow and flat no better on spiriva /some better on neb  Cough: none  Sleep: 3-4 x per week 30-45 degrees due to habit  SABA use: neb twice daily on avg - typically p exertion  Rec Plan A = Automatic = Always=    Spiriva one capsule each am  Plan B = Backup (to supplement plan A, not to replace it) Only use your albuterol inhaler  Plan C = Crisis (instead of Plan B but only if Plan B stops working) - only use your albuterol nebulizer if you first try Plan B    12/11/2022  f/u ov/ office/Carrie Case re: GOLD 2  maint on Stiolto/ still smoking   Chief Complaint  Patient presents with   Follow-up    Pt f/u states that she is doing "ok", no questions or concerns  Dyspnea:  worse and "has to stop while working in kitchen 50 times / tightness in chest" not reproducible in office walking on day of ov x 300 ft Cough: none  Sleeping: sleeping in recliner same angle as usual x years  SABA use: rarely using  02: none  Rec Stop alendronate x 6 week trial as it may have made your chest tightness  Pantoprazole (protonix) 40 mg   Take  30-60 min before first meal of the day and Pepcid (famotidine)  20 mg after supper until return to office Also  Ok to try albuterol 15 min before an activity (on alternating days)  that you know would usually make you short of breath The key is to stop smoking completely before smoking completely stops you! Please schedule a follow up office visit  in 6 weeks, call sooner if needed with all medications /inhalers/ solutions in hand   03/04/2023  f/u ov/ office/Carrie Case re: GOLD 2 copd  maint on stiolto(though very poor technique)   did  bring meds / did not start gerd rx/ breathing  worse x 3-4 m  assoc with fast heart rate ? in CAF "? For years" Chief Complaint  Patient presents with   COPD    Gold 2  Dyspnea:  10-15 min walking in front of appt/ [employer] each aisle stops gives out with chest tigthness that can last all day, not necessarily worse with exertion  Cough: says she has none but has a typical smoker's rattle/hack during interview Sleeping: in recliner with 45 degree no resp cc / no chest tightness  SABA use: not at all  02: none  Lung cancer screening : refused multiple times "I'd rather not know"  Rec The key is to stop smoking completely before smoking completely stops you! Pantoprazole (protonix) 40 mg   Take  30-60 min before first meal of the day and Pepcid (famotidine)  20 mg after supper until return to office - this is the best way to tell whether stomach acid is  contributing to your problem.    GERD diet reviewed, bed blocks rec  If you are having a bad day with the chest tightness go ahead and try the nebulizer if your heart rate is less than 100  Work on inhaler technique:   You are cleared for cardioversion from a lung perspective.   Holter monitoring with a diary is the best way to sort out whether the fast atrial fib is the problem causing your symptoms or not     04/08/2023  6 week f/u ov/[place] office/Carrie Case re: GOLD 2 copd maint on stiolto  Chief Complaint  Patient presents with   COPD    Gold 2, still smoking  Dyspnea:  not doing [employer] /  walking in from of appt s chest tightness  Cough: none  Sleeping: recliner at 45 degrees s resp cc  SABA use: none  02: none  Lung ca screening > refuses  Rec Stay on reflux medications until you are fully as active as you were prior to taking the  medicine and if chest tightness with exertion  still present while on medications then call me, otherwise ok to try off to see if it comes back in which case you'll need to stay on it until you return to see your PCP  The key is to stop smoking completely before smoking completely stops you! Also  Ok to try albuterol neb 15 min before an activity (on alternating days)  that you know would usually make you short of breath  Please schedule a follow up visit in 6 months but call sooner if needed   10/14/2023  f/u ov/[place] office/Carrie Case re: GOLD 2  maint on stiolto   Chief Complaint  Patient presents with   Follow-up    Follow up copd   Dyspnea:  [employer] walking fine  Cough: am congestion clear mucus  Sleeping: 45 degrees recliner since husband passed  resp cc  SABA use: none  02: none  LCS > refused again    No obvious day to day or daytime variability or assoc excess/ purulent sputum or mucus plugs or hemoptysis or cp or chest tightness, subjective wheeze or overt sinus or hb symptoms.    Also denies any obvious fluctuation of symptoms with weather or environmental changes or other aggravating or alleviating factors except as outlined above   No unusual exposure hx or h/o childhood pna/ asthma or knowledge of premature birth.  Current Allergies, Complete Past Medical History, Past Surgical History, Family History, and Social History were reviewed in [employer] record.  ROS  The following are not active complaints unless bolded Hoarseness, sore throat, dysphagia, dental problems, itching, sneezing,  nasal congestion or discharge of excess mucus or purulent secretions, ear ache,   fever, chills, sweats, unintended wt loss or wt gain, classically pleuritic or exertional cp,  orthopnea pnd or arm/hand swelling  or leg swelling, presyncope, palpitations, abdominal pain, anorexia, nausea, vomiting, diarrhea  or change in bowel habits or change in bladder habits, change  in stools or change in urine, dysuria, hematuria,  rash, arthralgias, visual complaints, headache, numbness, weakness or ataxia or problems with walking or coordination,  change in mood or  memory.        Current Meds  Medication Sig   albuterol (VENTOLIN HFA) 108 (90 Base) MCG/ACT inhaler Inhale 2 puffs into the lungs every 6 (six) hours as needed for wheezing or shortness of breath.   amiodarone (PACERONE) 200 MG tablet Take  1 tablet (200mg ) by mouth twice a day for 1 week followed by 1 tablet (200 mg) once daily   denosumab (PROLIA) 60 MG/ML SOSY injection Inject 60 mg into the skin every 6 (six) months for 1 dose.   digoxin (LANOXIN) 0.125 MG tablet Take 1 tablet (0.125 mg total) by mouth daily.   famotidine (PEPCID) 20 MG tablet Take 1 tablet (20 mg total) by mouth daily after supper.   furosemide (LASIX) 20 MG tablet Take 1 tablet (20 mg total) by mouth as needed. For increased swelling, weight gain of 3 LB in 1 day or 5 LB in 1 week   metoprolol succinate (TOPROL XL) 25 MG 24 hr tablet Take 1 tablet (25 mg total) by mouth daily.   pantoprazole (PROTONIX) 40 MG tablet Take 1 tablet (40 mg total) by mouth daily. Take 30-60 min before first meal of the day   rivaroxaban (XARELTO) 20 MG TABS tablet Take 1 tablet (20 mg total) by mouth daily with supper.   rosuvastatin (CRESTOR) 20 MG tablet Take 1 tablet (20 mg total) by mouth daily.   Tiotropium Bromide-Olodaterol (STIOLTO RESPIMAT) 2.5-2.5 MCG/ACT AERS Inhale 2 puffs into the lungs in the morning.            Past Medical History:  Diagnosis Date   Acute respiratory failure with hypoxia (HCC) 12/22/2018   Tobacco dependence        Objective:    Wts   10/14/2023       121   04/08/2023       130  03/04/2023       127  12/11/2022       131   04/20/2022         131   02/14/22 132 lb 6.4 oz (60.1 kg)  11/01/21 137 lb (62.1 kg)  08/29/21 139 lb (63 kg)    Vital signs reviewed  10/14/2023  - Note at rest 02 sats  91% on RA   General  appearance:    amb pleasant wf nad   HEENT : Oropharynx  clear   Nasal turbinates nl    NECK :  without  apparent JVD/ palpable Nodes/TM    LUNGS: no acc muscle use,  Mild barrel  contour chest wall with bilateral  Distant bs s audible wheeze and  without cough on insp or exp maneuvers  and mild  Hyperresonant  to  percussion bilaterally     CV:  RRR  no s3 or murmur or increase in P2, and no edema   ABD:  soft and nontender with pos end  insp Hoover's  in the supine position.  No bruits or organomegaly appreciated   MS:  Nl gait/ ext warm without deformities Or obvious joint restrictions  calf tenderness, cyanosis or clubbing     SKIN: warm and dry without lesions    NEURO:  alert, approp, nl sensorium with  no motor or cerebellar deficits apparent.           Assessment

## 2023-10-14 ENCOUNTER — Encounter: Payer: Self-pay | Admitting: Internal Medicine

## 2023-10-14 ENCOUNTER — Encounter: Payer: Self-pay | Admitting: Nurse Practitioner

## 2023-10-14 ENCOUNTER — Other Ambulatory Visit (HOSPITAL_COMMUNITY): Payer: Self-pay

## 2023-10-14 ENCOUNTER — Ambulatory Visit (INDEPENDENT_AMBULATORY_CARE_PROVIDER_SITE_OTHER): Payer: 59 | Admitting: Internal Medicine

## 2023-10-14 ENCOUNTER — Ambulatory Visit: Payer: 59 | Attending: Nurse Practitioner | Admitting: Nurse Practitioner

## 2023-10-14 VITALS — BP 102/58 | HR 54 | Ht 66.0 in | Wt 121.0 lb

## 2023-10-14 VITALS — BP 102/64 | HR 53 | Ht 66.0 in | Wt 121.6 lb

## 2023-10-14 DIAGNOSIS — J449 Chronic obstructive pulmonary disease, unspecified: Secondary | ICD-10-CM

## 2023-10-14 DIAGNOSIS — R0989 Other specified symptoms and signs involving the circulatory and respiratory systems: Secondary | ICD-10-CM

## 2023-10-14 DIAGNOSIS — I480 Paroxysmal atrial fibrillation: Secondary | ICD-10-CM | POA: Diagnosis not present

## 2023-10-14 DIAGNOSIS — R5383 Other fatigue: Secondary | ICD-10-CM

## 2023-10-14 DIAGNOSIS — I5030 Unspecified diastolic (congestive) heart failure: Secondary | ICD-10-CM | POA: Diagnosis not present

## 2023-10-14 DIAGNOSIS — E785 Hyperlipidemia, unspecified: Secondary | ICD-10-CM | POA: Diagnosis not present

## 2023-10-14 DIAGNOSIS — F17210 Nicotine dependence, cigarettes, uncomplicated: Secondary | ICD-10-CM

## 2023-10-14 DIAGNOSIS — I6523 Occlusion and stenosis of bilateral carotid arteries: Secondary | ICD-10-CM | POA: Diagnosis not present

## 2023-10-14 MED ORDER — STIOLTO RESPIMAT 2.5-2.5 MCG/ACT IN AERS
2.0000 | INHALATION_SPRAY | Freq: Every morning | RESPIRATORY_TRACT | 3 refills | Status: DC
  Filled 2023-10-14 – 2023-10-26 (×2): qty 12, 90d supply, fill #0
  Filled 2024-01-22: qty 12, 90d supply, fill #1

## 2023-10-14 NOTE — Patient Instructions (Addendum)
 Medication Instructions:  Your physician has recommended you make the following change in your medication:  Please stop metoprolol   Labwork: None   Testing/Procedures: Your physician has requested that you have a carotid duplex. This test is an ultrasound of the carotid arteries in your neck. It looks at blood flow through these arteries that supply the brain with blood. Allow one hour for this exam. There are no restrictions or special instructions.  Follow-Up: Your physician recommends that you schedule a follow-up appointment in:  1 month nurse visit  2 months Philis Nettle   Any Other Special Instructions Will Be Listed Below (If Applicable).  If you need a refill on your cardiac medications before your next appointment, please call your pharmacy.

## 2023-10-14 NOTE — Assessment & Plan Note (Addendum)
 Active smoking  - 11/01/2021  After extensive coaching inhaler device,  effectiveness =  75%> continue spiriva dpi and prn saba  - 11/01/2021   Walked on RA  x  3  lap(s) =  approx 450  ft  @ mod pace, stopped due to end of study, sob on 3rd lap with lowest 02 sats 94%  - PFT's  03/08/22  FEV1 1.62 (61 % ) ratio 0.69  p 5 % improvement from saba p  spiriva prior to study with DLCO  8.80 (41%)   and FV curve mild concavity   - 04/20/2022  After extensive coaching inhaler device,  effectiveness = 75%    SMI > try spiriva smi sample then rx with stiolto trial > f/u q 3 m   - 12/11/2022   Walked on RA  x  2  lap(s) =  approx 300  ft  @ slow pace, stopped due to sob  with lowest 02 sats 92% s cp /chest tightness   - 03/04/2023  After extensive coaching inhaler device,  effectiveness =  75% from a baseline of 25% (short Ti) with smi > continue stiolto and off fosamax (chest tightness) and max gerd rx then regroup in 6 weeks    - 03/04/2023   Walked on RA  x  1  lap(s) =  approx 150  ft  @ mod pace, stopped due to tired/chest tight "but no worse than at rest)  with lowest 02 sats 95% but no sob  - 04/08/2023   Walked on RA  x  2  lap(s) =  approx 300  ft  @ nl  pace, stopped due to fatigue s chest tight or sob  with lowest 02 sats 91%        Pt is Group B in terms of symptom/risk and laba/lama therefore appropriate rx at this point >>>  stiolto appropriate   Ok to taper off gerd rx to see if any flare of cough now that off fosamax - see avs for instructions unique to this ov

## 2023-10-14 NOTE — Assessment & Plan Note (Addendum)
 Counseled re importance of smoking cessation but did not meet time criteria for separate billing    F/u   12 months soon if needed          Each maintenance medication was reviewed in detail including emphasizing most importantly the difference between maintenance and prns and under what circumstances the prns are to be triggered using an action plan format where appropriate.  Total time for H and P, chart review, counseling, reviewing smi device(s) and generating customized AVS unique to this office visit / same day charting = 25 min

## 2023-10-14 NOTE — Progress Notes (Unsigned)
 Cardiology Office Note:  .   Date:  10/14/2023 ID:  Carrie Case, DOB 1955/10/11, MRN [id number] PCP: Billie Lade, MD  [facility] Providers Cardiologist:  Dina Rich, MD PV Cardiologist:  [name], MD    History of Present Illness: .   Carrie Case is a 68 y.o. female with a PMH of chronic HFpEF, A-fib, PAD, HLD, carotid artery stenosis, tobacco use, and COPD, who presents today for scheduled follow-up.   Presented to [facility] on June 21, 2023 for A-fib with RVR, CHF, pneumonia, UTI, she was transferred to [facility] for further care.  BNP 746.  AST 109, ALT 52.  She was placed on amiodarone drip and received Lasix.  Underwent DCCV on June 24, 2023.  The following day she went back to A-fib with RVR.  Also received 2 doses of digoxin, was restarted on Toprol and also on Cardizem CD3 100 mg daily at baseline.  Updated echo revealed EF 55 to 60%, mild to moderate AR, mild LVH and preserved RV function.  It was felt that CHF exacerbation was likely related to A-fib with RVR.  Was treated with antibiotics for CAP.  Last seen in office on August 05, 2023.  She was doing well at that time however her main concern was fatigue. Denied any chest pain, shortness of breath, palpitations, syncope, presyncope, dizziness, orthopnea, PND, swelling or significant weight changes, acute bleeding, or claudication.  ROS: Negative. Seen HPI.   Studies Reviewed: Marland Kitchen    EKG:  EKG Interpretation Date/Time:  Monday October 14 2023 12:45:12 EST Ventricular Rate:  52 PR Interval:  132 QRS Duration:  86 QT Interval:  422 QTC Calculation: 392 R Axis:   95  Text Interpretation: Sinus bradycardia Rightward axis When compared with ECG of 05-Aug-2023 15:00, No significant change was found Confirmed by Sharlene Dory [phone]) on 10/14/2023 1:08:54 PM   Echo 06/2023: 1. Left ventricular ejection fraction, by estimation, is 55 to 60%. The  left ventricle has normal  function. The left ventricle has no regional  wall motion abnormalities. There is mild left ventricular hypertrophy.  Left ventricular diastolic function could not be evaluated.   2. Right ventricular systolic function is normal. The right ventricular  size is normal. There is moderately elevated pulmonary artery systolic  pressure.   3. Left atrial size was moderately dilated.   4. Right atrial size was moderately dilated.   5. The mitral valve is normal in structure. Trivial mitral valve  regurgitation. No evidence of mitral stenosis. Moderate mitral annular  calcification.   6. The aortic valve is tricuspid. Aortic valve regurgitation is mild to  moderate. Aortic valve sclerosis/calcification is present, without any  evidence of aortic stenosis.   7. The inferior vena cava is normal in size with greater than 50%  respiratory variability, suggesting right atrial pressure of 3 mmHg.  Echo 10/2022:  1. Left ventricular ejection fraction, by estimation, is 55 to 60%. The  left ventricle has normal function. The left ventricle has no regional  wall motion abnormalities. There is mild concentric left ventricular  hypertrophy. Left ventricular diastolic  parameters are indeterminate. The average left ventricular global  longitudinal strain is -19.6 %. The global longitudinal strain is normal.   2. Right ventricular systolic function is low normal. The right  ventricular size is normal. There is normal pulmonary artery systolic  pressure. The estimated right ventricular systolic pressure is 35.7 mmHg.   3. Left atrial size was severely  dilated.   4. Right atrial size was moderately dilated.   5. The mitral valve is degenerative. Mild mitral valve regurgitation.  Moderate mitral annular calcification.   6. The aortic valve is tricuspid. There is mild calcification of the  aortic valve. Aortic valve regurgitation is mild. Aortic valve sclerosis  is present, with no evidence of aortic valve  stenosis. Aortic  regurgitation PHT measures 560 msec.   7. The inferior vena cava is normal in size with greater than 50%  respiratory variability, suggesting right atrial pressure of 3 mmHg.   Comparison(s): Prior images reviewed side by side. LVEF remains normal range at 55-60%.   Risk Assessment/Calculations:    CHA2DS2-VASc Score = 3  This indicates a 3.2% annual risk of stroke. The patient's score is based upon: CHF History: 0 HTN History: 0 Diabetes History: 0 Stroke History: 0 Vascular Disease History: 1 Age Score: 1 Gender Score: 1   Physical Exam:   VS:  BP (!) 102/58   Pulse (!) 54   Ht 5\' 6"  (1.676 m)   Wt 121 lb (54.9 kg)   SpO2 98%   BMI 19.53 kg/m    Wt Readings from Last 3 Encounters:  10/14/23 121 lb (54.9 kg)  10/14/23 121 lb 9.6 oz (55.2 kg)  08/05/23 121 lb (54.9 kg)    GEN: Well nourished, well developed in no acute distress, appears fatigued NECK: No JVD; bilateral carotid bruits noted CARDIAC: S1/S2, slow rate and regular rhythm, no murmurs, rubs, gallops RESPIRATORY:  Clear to auscultation without rales, wheezing or rhonchi  ABDOMEN: Soft, non-tender, non-distended EXTREMITIES:  No edema; No deformity   ASSESSMENT AND PLAN: .    HFpEF Stage C, NYHA class I-II symptoms. EF 55-60% 06/2023. Euvolemic and well compensated on exam. GDMT limited d/t her BP trends, however will stop Metoprolol - see below. Continue current medication regimen. Low sodium diet, fluid restriction <2L, and daily weights encouraged. Educated to contact our office for weight gain of 2 lbs overnight or 5 lbs in one week.  PAF, fatigue Denies any palpitations or tachycardia. EKG reveals SB. Stopping Beta blocker to improve symptoms.  Continue Amiodarone, Digoxin, Toprol-XL, and Xarelto 20 mg daily.  Continue Xarelto for stroke prevention.  She denies any bleeding issues and is on appropriate dosage.  Heart healthy diet and regular cardiovascular exercise encouraged. Care and ED  precautions discussed. May need to consider stopping Digoxin at next visit. Will bring back in 3-4 weeks for nurse visit/EKG/and vitals.   HLD, carotid artery stenosis, carotid bruits Denies any  symptoms.  Asymptomatic left subclavian stenosis, bilateral 1 to 39% ICA stenosis.  LDL 24 06/2023.  Carotid bruits noted on exam, will update carotid doppler. Continue rosuvastatin.  Continue to follow-up with Dr. Kirke Corin.   COPD Denies any recent symptoms.  Continue current medication regimen.  Continue follow-up with Dr. Sherene Sires.    Dispo: Follow-up with me or APP in 2-3 months or sooner if anything changes.  Signed, Sharlene Dory, NP

## 2023-10-14 NOTE — Patient Instructions (Addendum)
 1) stop pantoprazole 2) increase pepcid to 20 mg one twice daily after meals x one week (after breakfast and supper)  3) after one week if no cough and chest pain or obvious heart brun reduce pepcid to 20 mg after supper x one more week and  then stop  If worse cough or acid symptoms go back one step and try the next step after 2 weeks with 2 weeks between each step down.    The key is to stop smoking completely before smoking completely stops you!   Please schedule a follow up visit in 12  months but call sooner if needed

## 2023-10-15 ENCOUNTER — Other Ambulatory Visit: Payer: Self-pay

## 2023-10-15 ENCOUNTER — Ambulatory Visit: Payer: Self-pay | Admitting: Licensed Clinical Social Worker

## 2023-10-15 NOTE — Patient Instructions (Signed)
 Visit Information  Thank you for taking time to visit with me today. Please don't hesitate to contact me if I can be of assistance to you.   Following are the goals we discussed today:   Goals Addressed             This Visit's Progress    patient is at risk for falls. Patient is concerned over managing health needs       Interventions:  Spoke with Tonie Griffith, daughter of client, via phone today about client needs Client is at risk for falls. Client has mobility challenges Discussed medication procurement for client Regarding client transport needs, client daughter, Tonie Griffith, transports client as needed to and from client medical appointments Client sees Dr. Billie Lade as PCP Discussed program support for client with RN, LCSW, Pharmacist. Encouraged client to access program support as needed Client has support from her daughter, Tonie Griffith Client likes to watch TV to relax. She likes to listen to music Judeth Horn for phone call with LCSW today Encouraged client or Tonie Griffith to call LCSW as needed for SW support for client at [phone].          Our next appointment is by telephone on 12/23/23 at 2:00 PM   Please call the care guide team at [phone] if you need to cancel or reschedule your appointment.   If you are experiencing a Mental Health or Behavioral Health Crisis or need someone to talk to, please go to [facility] [address] [place] [phone])   The patient/ Tonie Griffith, daughter,  verbalized understanding of instructions, educational materials, and care plan provided today and DECLINED offer to receive copy of patient instructions, educational materials, and care plan.   The patient / Tonie Griffith, daughter, has been provided with contact information for the care management team and has been advised to call with any health related questions or concerns.    Lorna Few  MSW,  LCSW [place]/[facility] Licensed Clinical Social Worker Direct Dial:  [phone] Fax:  [phone] Website:  [email or web address]

## 2023-10-15 NOTE — Patient Outreach (Signed)
 Care Coordination   Follow Up Visit Note   10/15/2023 Name: Carrie Case MRN: [id number] DOB: [birth year]  Carrie Case is a 68 y.o. year old female who sees [name], [name], MD for primary care. I spoke with  Daryll Drown [place]/ Tonie Griffith, daughter of client, via phone today.  What matters to the patients health and wellness today? patient is at risk for falls. Patient is concerned over managing health needs    Goals Addressed             This Visit's Progress    patient is at risk for falls. Patient is concerned over managing health needs       Interventions:  Spoke with Tonie Griffith, daughter of client, via phone today about client needs Client is at risk for falls. Client has mobility challenges Discussed medication procurement for client Regarding client transport needs, client daughter, Tonie Griffith, transports client as needed to and from client medical appointments Client sees Dr. Billie Lade as PCP Discussed program support for client with RN, LCSW, Pharmacist. Encouraged client to access program support as needed Client has support from her daughter, Tonie Griffith Client likes to watch TV to relax. She likes to listen to music Judeth Horn for phone call with LCSW today Encouraged client or Tonie Griffith to call LCSW as needed for SW support for client at [phone].          SDOH assessments and interventions completed:  Yes  SDOH Interventions Today    Flowsheet Row Most Recent Value  SDOH Interventions   Depression Interventions/Treatment  Counseling  Physical Activity Interventions Other (Comments)  [some mobility challenges , at risk for falls]  Stress Interventions Other (Comment)  [mobility challenges,  at risk for fall. has stress in managing medical needs faced]        Care Coordination Interventions:  Yes, provided   Interventions Today    Flowsheet Row Most Recent Value  Chronic Disease   Chronic disease during  today's visit Other  [spoke with Tonie Griffith, daughter of client, about client needs]  General Interventions   General Interventions Discussed/Reviewed General Interventions Discussed, Community Resources  Education Interventions   Education Provided Provided Education  Provided Verbal Education On [employer]  Mental Health Interventions   Mental Health Discussed/Reviewed Coping Strategies  Nutrition Interventions   Nutrition Discussed/Reviewed Nutrition Discussed  Pharmacy Interventions   Pharmacy Dicussed/Reviewed Pharmacy Topics Discussed  Safety Interventions   Safety Discussed/Reviewed Fall Risk        Follow up plan: Follow up call scheduled for 12/23/23 at 2:00 PM     Encounter Outcome:  Patient Visit Completed    Lorna Few  MSW, LCSW [place]/[facility] Licensed Clinical Social Worker Direct Dial:  [phone] Fax:  [phone] Website:  [email or web address]

## 2023-10-18 ENCOUNTER — Other Ambulatory Visit: Payer: Self-pay | Admitting: Nurse Practitioner

## 2023-10-21 ENCOUNTER — Other Ambulatory Visit: Payer: Self-pay

## 2023-10-21 ENCOUNTER — Encounter: Payer: Self-pay | Admitting: Nurse Practitioner

## 2023-10-21 MED ORDER — DIGOXIN 125 MCG PO TABS
0.1250 mg | ORAL_TABLET | Freq: Every day | ORAL | 0 refills | Status: DC
  Filled 2023-10-21: qty 90, 90d supply, fill #0

## 2023-10-22 ENCOUNTER — Other Ambulatory Visit: Payer: Self-pay

## 2023-10-26 ENCOUNTER — Other Ambulatory Visit (HOSPITAL_BASED_OUTPATIENT_CLINIC_OR_DEPARTMENT_OTHER): Payer: Self-pay

## 2023-10-26 ENCOUNTER — Other Ambulatory Visit (HOSPITAL_COMMUNITY): Payer: Self-pay

## 2023-10-28 ENCOUNTER — Telehealth: Payer: Self-pay | Admitting: Cardiology

## 2023-10-28 NOTE — Telephone Encounter (Signed)
 Patient's daughter is requesting call back to reschedule nurse visit on 03/31.

## 2023-11-01 ENCOUNTER — Other Ambulatory Visit: Payer: Self-pay

## 2023-11-01 ENCOUNTER — Telehealth: Payer: Self-pay | Admitting: Cardiology

## 2023-11-01 NOTE — Telephone Encounter (Signed)
 I cancel appt on 3/31 by mistake but daughter states that nurse was calling to get it r/s. If you could please call patient back to r/s the nurse visit.

## 2023-11-03 ENCOUNTER — Other Ambulatory Visit (HOSPITAL_COMMUNITY): Payer: Self-pay

## 2023-11-06 ENCOUNTER — Other Ambulatory Visit: Payer: Self-pay

## 2023-11-06 ENCOUNTER — Other Ambulatory Visit (HOSPITAL_COMMUNITY): Payer: Self-pay

## 2023-11-11 ENCOUNTER — Ambulatory Visit: Payer: Medicare HMO | Admitting: Internal Medicine

## 2023-11-18 ENCOUNTER — Ambulatory Visit: Payer: 59

## 2023-11-19 ENCOUNTER — Ambulatory Visit: Payer: Medicare HMO

## 2023-12-16 ENCOUNTER — Ambulatory Visit: Payer: Medicare HMO

## 2023-12-16 ENCOUNTER — Encounter: Payer: Self-pay | Admitting: Nurse Practitioner

## 2023-12-16 ENCOUNTER — Ambulatory Visit: Payer: 59 | Attending: Nurse Practitioner | Admitting: Nurse Practitioner

## 2023-12-16 ENCOUNTER — Telehealth: Payer: Self-pay | Admitting: Nurse Practitioner

## 2023-12-16 VITALS — BP 118/62 | HR 61 | Ht 66.0 in | Wt 118.6 lb

## 2023-12-16 DIAGNOSIS — I6523 Occlusion and stenosis of bilateral carotid arteries: Secondary | ICD-10-CM | POA: Diagnosis not present

## 2023-12-16 DIAGNOSIS — R0989 Other specified symptoms and signs involving the circulatory and respiratory systems: Secondary | ICD-10-CM

## 2023-12-16 DIAGNOSIS — G4709 Other insomnia: Secondary | ICD-10-CM

## 2023-12-16 DIAGNOSIS — R5383 Other fatigue: Secondary | ICD-10-CM | POA: Diagnosis not present

## 2023-12-16 DIAGNOSIS — J449 Chronic obstructive pulmonary disease, unspecified: Secondary | ICD-10-CM

## 2023-12-16 DIAGNOSIS — I480 Paroxysmal atrial fibrillation: Secondary | ICD-10-CM | POA: Diagnosis not present

## 2023-12-16 DIAGNOSIS — F32A Depression, unspecified: Secondary | ICD-10-CM

## 2023-12-16 DIAGNOSIS — E785 Hyperlipidemia, unspecified: Secondary | ICD-10-CM

## 2023-12-16 DIAGNOSIS — I5030 Unspecified diastolic (congestive) heart failure: Secondary | ICD-10-CM | POA: Diagnosis not present

## 2023-12-16 NOTE — Patient Instructions (Signed)
 Medication Instructions:  Your physician has recommended you make the following change in your medication:  Please stop Digoxin    Labwork: None   Testing/Procedures: None   Follow-Up: Your physician recommends that you schedule a follow-up appointment in:  4 week telephone visit with Clementine Cutting  Please follow up with Dr.Dixon within 1 week   Any Other Special Instructions Will Be Listed Below (If Applicable).  If you need a refill on your cardiac medications before your next appointment, please call your pharmacy.

## 2023-12-16 NOTE — Progress Notes (Signed)
 Cardiology Office Note:  .   Date:  12/16/2023 ID:  AVIANA Case, DOB 15-Aug-1956, MRN [id number] PCP: Tobi Fortes, MD  [facility] Providers Cardiologist:  Armida Lander, MD PV Cardiologist:  Antionette Kirks, MD    History of Present Illness: .   Carrie Case is a 68 y.o. female with a PMH of chronic HFpEF, A-fib, PAD, HLD, carotid artery stenosis, tobacco use, and COPD, who presents today for scheduled follow-up.   Presented to [facility] on June 21, 2023 for A-fib with RVR, CHF, pneumonia, UTI, she was transferred to [facility] for further care.  BNP 746.  AST 109, ALT 52.  She was placed on amiodarone  drip and received Lasix .  Underwent DCCV on June 24, 2023.  The following day she went back to A-fib with RVR.  Also received 2 doses of digoxin , was restarted on Toprol  and also on Cardizem  CD3 100 mg daily at baseline.  Updated echo revealed EF 55 to 60%, mild to moderate AR, mild LVH and preserved RV function.  It was felt that CHF exacerbation was likely related to A-fib with RVR.  Was treated with antibiotics for CAP.  Last seen on 10/14/2023. Continued to note fatigue. Metoprolol  was stopped to improve her symptoms.   Today she presents for follow-up. She says fatigue has improved since stopping medication at last office visit. Denies any chest pain, shortness of breath, palpitations, syncope, presyncope, dizziness, orthopnea, PND, swelling or significant weight changes, acute bleeding, or claudication. Sadly, pt says she has been under a lot of stress since last office visit. Daughter has been diagnosed with Stage IV cancer, pt is tearful during interview. Says she mainly stays in the house, has limited access to transportation d/t the fact her daughter helped her with getting to her appointments. Also admits to insomnia. States she has been "getting Z's off the streets" to help improve her loss of sleep.    ROS: Negative. Seen HPI.   Studies  Reviewed: Aaron Aas    EKG: EKG is not ordered today.      Echo 06/2023: 1. Left ventricular ejection fraction, by estimation, is 55 to 60%. The  left ventricle has normal function. The left ventricle has no regional  wall motion abnormalities. There is mild left ventricular hypertrophy.  Left ventricular diastolic function could not be evaluated.   2. Right ventricular systolic function is normal. The right ventricular  size is normal. There is moderately elevated pulmonary artery systolic  pressure.   3. Left atrial size was moderately dilated.   4. Right atrial size was moderately dilated.   5. The mitral valve is normal in structure. Trivial mitral valve  regurgitation. No evidence of mitral stenosis. Moderate mitral annular  calcification.   6. The aortic valve is tricuspid. Aortic valve regurgitation is mild to  moderate. Aortic valve sclerosis/calcification is present, without any  evidence of aortic stenosis.   7. The inferior vena cava is normal in size with greater than 50%  respiratory variability, suggesting right atrial pressure of 3 mmHg.  Echo 10/2022:  1. Left ventricular ejection fraction, by estimation, is 55 to 60%. The  left ventricle has normal function. The left ventricle has no regional  wall motion abnormalities. There is mild concentric left ventricular  hypertrophy. Left ventricular diastolic  parameters are indeterminate. The average left ventricular global  longitudinal strain is -19.6 %. The global longitudinal strain is normal.   2. Right ventricular systolic function is low normal. The  right  ventricular size is normal. There is normal pulmonary artery systolic  pressure. The estimated right ventricular systolic pressure is 35.7 mmHg.   3. Left atrial size was severely dilated.   4. Right atrial size was moderately dilated.   5. The mitral valve is degenerative. Mild mitral valve regurgitation.  Moderate mitral annular calcification.   6. The aortic valve  is tricuspid. There is mild calcification of the  aortic valve. Aortic valve regurgitation is mild. Aortic valve sclerosis  is present, with no evidence of aortic valve stenosis. Aortic  regurgitation PHT measures 560 msec.   7. The inferior vena cava is normal in size with greater than 50%  respiratory variability, suggesting right atrial pressure of 3 mmHg.   Comparison(s): Prior images reviewed side by side. LVEF remains normal range at 55-60%.   Risk Assessment/Calculations:    CHA2DS2-VASc Score = 3  This indicates a 3.2% annual risk of stroke. The patient's score is based upon: CHF History: 0 HTN History: 0 Diabetes History: 0 Stroke History: 0 Vascular Disease History: 1 Age Score: 1 Gender Score: 1   Physical Exam:   VS:  BP 118/62   Pulse 61   Ht 5\' 6"  (1.676 m)   Wt 118 lb 9.6 oz (53.8 kg)   SpO2 95%   BMI 19.14 kg/m    Wt Readings from Last 3 Encounters:  12/16/23 118 lb 9.6 oz (53.8 kg)  10/14/23 121 lb (54.9 kg)  10/14/23 121 lb 9.6 oz (55.2 kg)    GEN: Well nourished, well developed in no acute distress, appears emotionally depressed NECK: No JVD; bilateral carotid bruits noted CARDIAC: S1/S2, RRR, no murmurs, rubs, gallops RESPIRATORY:  Clear to auscultation without rales, wheezing or rhonchi  ABDOMEN: Soft, non-tender, non-distended EXTREMITIES:  No edema; No deformity   ASSESSMENT AND PLAN: .    HFpEF Stage C, NYHA class I-II symptoms. EF 55-60% 06/2023. Euvolemic and well compensated on exam. Continue current medication regimen. Low sodium diet, fluid restriction <2L, and daily weights encouraged. Educated to contact our office for weight gain of 2 lbs overnight or 5 lbs in one week.  PAF, fatigue Denies any palpitations or tachycardia.  Continue Amiodarone  and Xarelto  20 mg daily.  Continue Xarelto  for stroke prevention.  Will stop Digoxin  to see if this improves her symptoms. She denies any bleeding issues and is on appropriate dosage of Xarelto .   Heart healthy diet and regular cardiovascular exercise encouraged. Care and ED precautions discussed.   HLD, carotid artery stenosis, carotid bruits Denies any  symptoms.  Asymptomatic left subclavian stenosis, bilateral 1 to 39% ICA stenosis.  LDL 24 06/2023.  Carotid duplex not performed since last office evaluation, will defer at this time d/t her current emotional state. Continue rosuvastatin .  Continue to follow-up with Dr. Alvenia Aus.   COPD Denies any recent symptoms.  Continue current medication regimen.  Continue follow-up with Dr. Waymond Hailey.   5. Depression, insomnia Pt is emotional during interview and when asking about SI/HI, pt denies. She also states, "I'm not going to answer this question as I'm afraid what might happen if I do." I then asked her to clarify again and asked about any thoughts of SI/HI and she answered "No." Pt was agreeable for me to notify her PCP, Dr. Kermit Ped, who is aware of the situation. She will follow-up with his office, ASAP. Also put in urgent referral for LCSW and therapy with [facility]. I have called [phone] number that I also educated patient about,  and was recommended to call the Mobile Crisis Hotline. Spoke with Hospital doctor with the Hotline who agreed with my recommendation not to arrange home visit at this time as it might upset patient. She recommended I provide the pt with the 24/7 number as well as the [facility]. Will provide this to patient via MyChart message. Care and ED precautions discussed.    Dispo: Follow-up with me or APP in 1 month via telephone visit or sooner if anything changes.  Signed, Lasalle Pointer, NP

## 2023-12-16 NOTE — Telephone Encounter (Signed)
  Patient Consent for Virtual Visit        Carrie Case has provided verbal consent on 12/16/2023 for a virtual visit (video or telephone).   CONSENT FOR VIRTUAL VISIT FOR:  Carrie Case  By participating in this virtual visit I agree to the following:  I hereby voluntarily request, consent and authorize [facility] and its employed or contracted physicians, physician assistants, nurse practitioners or other licensed health care professionals (the Practitioner), to provide me with telemedicine health care services (the "Services") as deemed necessary by the treating Practitioner. I acknowledge and consent to receive the Services by the Practitioner via telemedicine. I understand that the telemedicine visit will involve communicating with the Practitioner through live audiovisual communication technology and the disclosure of certain medical information by electronic transmission. I acknowledge that I have been given the opportunity to request an in-person assessment or other available alternative prior to the telemedicine visit and am voluntarily participating in the telemedicine visit.  I understand that I have the right to withhold or withdraw my consent to the use of telemedicine in the course of my care at any time, without affecting my right to future care or treatment, and that the Practitioner or I may terminate the telemedicine visit at any time. I understand that I have the right to inspect all information obtained and/or recorded in the course of the telemedicine visit and may receive copies of available information for a reasonable fee.  I understand that some of the potential risks of receiving the Services via telemedicine include:  Delay or interruption in medical evaluation due to technological equipment failure or disruption; Information transmitted may not be sufficient (e.g. poor resolution of images) to allow for appropriate medical decision making by the  Practitioner; and/or  In rare instances, security protocols could fail, causing a breach of personal health information.  Furthermore, I acknowledge that it is my responsibility to provide information about my medical history, conditions and care that is complete and accurate to the best of my ability. I acknowledge that Practitioner's advice, recommendations, and/or decision may be based on factors not within their control, such as incomplete or inaccurate data provided by me or distortions of diagnostic images or specimens that may result from electronic transmissions. I understand that the practice of medicine is not an exact science and that Practitioner makes no warranties or guarantees regarding treatment outcomes. I acknowledge that a copy of this consent can be made available to me via my patient portal [facility] MyChart), or I can request a printed copy by calling the office of [facility].    I understand that my insurance will be billed for this visit.   I have read or had this consent read to me. I understand the contents of this consent, which adequately explains the benefits and risks of the Services being provided via telemedicine.  I have been provided ample opportunity to ask questions regarding this consent and the Services and have had my questions answered to my satisfaction. I give my informed consent for the services to be provided through the use of telemedicine in my medical care

## 2023-12-23 ENCOUNTER — Ambulatory Visit: Payer: 59 | Admitting: Licensed Clinical Social Worker

## 2023-12-23 NOTE — Patient Outreach (Signed)
 Complex Care Management   Visit Note  12/23/2023  Name:  Carrie Case MRN: [id number] DOB: Jan 21, 1956  Situation: Referral received for Complex Care Management related to  client management of medical needs faced  I obtained verbal consent from Patient.  Visit completed with patient  on the phone  Background:   Past Medical History:  Diagnosis Date   A-fib (HCC)    Acute respiratory failure with hypoxia (HCC) 12/22/2018   Carotid artery stenosis    CHF (congestive heart failure) (HCC)    COPD (chronic obstructive pulmonary disease) (HCC)    History of home oxygen therapy    HLD (hyperlipidemia)    Memory changes    Osteoporosis    PAD (peripheral artery disease) (HCC)    Recurrent falls    Tobacco dependence     Assessment: Patient Reported Symptoms:  Cognitive    Some memory issues. Memory changes    Neurological  MDD    HEENT    No issues mentioned by client    Cardiovascular  A Fib.CHF    Respiratory  COPD; CHF    Endocrine    No issues mentioned by client  Gastrointestinal    Malnutrition    Genitourinary  Unable to assess    Integumentary  Unable to assess    Musculoskeletal    Weakness; At risk for fall; mobility challenges; needs time to complete ADLs      Psychosocial  Malnutrition  Stress related to current illness of her daughter  Needs transport help Quality of Family Relationships: supportive Do you feel physically threatened by others?: No      12/23/2023    3:32 PM  Depression screen PHQ 2/9  Decreased Interest 1  Down, Depressed, Hopeless 1  PHQ - 2 Score 2  Altered sleeping 1  Tired, decreased energy 1  Change in appetite 1  Feeling bad or failure about yourself  1  Trouble concentrating 1  Moving slowly or fidgety/restless 1  Suicidal thoughts 0  PHQ-9 Score 8  Difficult doing work/chores Somewhat difficult    Vitals:  Client unable to provide BP information   Medications Reviewed Today     Reviewed by Afton Horse (Social Worker) on 12/23/23 at 1529  Med List Status: <None>   Medication Order Taking? Sig Documenting Provider Last Dose Status Informant  albuterol  (VENTOLIN  HFA) 108 (90 Base) MCG/ACT inhaler [id number] No Inhale 2 puffs into the lungs every 6 (six) hours as needed for wheezing or shortness of breath. Tobi Fortes, MD Taking Active   amiodarone  (PACERONE ) 200 MG tablet [id number] No Take 1 tablet (200mg ) by mouth twice a day for 1 week followed by 1 tablet (200 mg) once daily Lasalle Pointer, NP Taking Active   denosumab  (PROLIA ) 60 MG/ML SOSY injection [id number] No Inject 60 mg into the skin every 6 (six) months for 1 dose. Tobi Fortes, MD Taking Active   famotidine  (PEPCID ) 20 MG tablet [id number] No Take 1 tablet (20 mg total) by mouth daily after supper. Diamond Formica, MD Taking Active Self, Child  furosemide  (LASIX ) 20 MG tablet [id number] No Take 1 tablet (20 mg total) by mouth as needed. For increased swelling, weight gain of 3 LB in 1 day or 5 LB in 1 week Deforest Fast, MD Taking Active   pantoprazole  (PROTONIX ) 40 MG tablet [id number] No Take 1 tablet (40 mg total) by mouth daily. Take 30-60 min before first meal of the day Diamond Formica, MD  Taking Active   rivaroxaban  (XARELTO ) 20 MG TABS tablet [id number] No Take 1 tablet (20 mg total) by mouth daily with supper. Lasalle Pointer, NP Taking Active   rosuvastatin  (CRESTOR ) 20 MG tablet [id number] No Take 1 tablet (20 mg total) by mouth daily. Lasalle Pointer, NP Taking Active   Tiotropium Bromide -Olodaterol (STIOLTO RESPIMAT ) 2.5-2.5 MCG/ACT AERS [id number] No Inhale 2 puffs into the lungs in the morning. Diamond Formica, MD Taking Active             Recommendation:   Client to attend scheduled medical appointments Client to take medications as prescribed Client to make transportation arrangements needed for her to attend upcoming client medical appointments Client to allow time for ADLs completion Client  to allow time for self care, rest, relaxation Client to use walker or cane as needed to help with safe walking Client to call LCSW as needed for SW support  Follow Up Plan:   Telephone follow up appointment date/time:  02/11/24 at 10:00 AM with LCSW   Alexandria Angel  MSW, LCSW /Value Based Care [facility] Licensed Clinical Social Worker Direct Dial:  [phone] Fax:  [phone] Website:  [email or web address]

## 2023-12-23 NOTE — Patient Instructions (Signed)
 Visit Information  Thank you for taking time to visit with me today. Please don't hesitate to contact me if I can be of assistance to you before our next scheduled appointment.  Our next appointment is by telephone on 02/11/24 at 10:00 AM   Please call the care guide team at [phone] if you need to cancel or reschedule your appointment.   Following is a copy of your care plan:   Goals Addressed             This Visit's Progress    VBCI Social Work Care Plan       Problems:   Transportation challenges (LCSW gave Carrie Case the name and phone number of [facility] transport services)             Back pain issues             Needs extra time to complete ADLs            Stress related to current illness of her daughter            Insomnia  CSW Clinical Goal(s):   Over the next   30   days the Patient will attend all scheduled medical appointments as evidenced by patient report and care team review of appointment completion in electronic MEDICAL RECORD NUMBER  .            Over next 30 days patient will make her own transport arrangements for upcoming medical appointments AEB patient attendance at scheduled medical appointments in next 30 days   Interventions:  Spoke with Carrie Case about her status and needs             Carrie Case said her daughter has serious medical issues and Carrie Case is stressed related to current medical issues of her daughter            Discussed medication procurement of Carrie Case            Discussed ambulation of Carrie Case . Discussed Carrie Case transport needs. Carrie Case must now make her own transport arrangements to appointments for Carrie Case. LCSW gave Carrie Case the name and phone number for local [facility] transport agency and encouraged Carrie Case to call [facility] to make needed transport arrangements for Carrie Case           Discussed pain issues of Carrie Case; discussed sleeping issues of Carrie Case           Encouraged Carrie Case to call LCSW as needed for SW support  Patient Goals/Self-Care  Activities:  Take medications as prescribed             Make needed transport arrangements for Carrie Case to attend her upcoming medical appointments             Attend scheduled medical appointments            Allow time to complete ADLs             Allow time for rest and self care             Call LCSW as needed for SW support at [phone]  Plan:   LCSW to call Carrie Case on 02/11/24 at 3:30 PM         Please go to [facility] [address] [place] [phone]) if you are experiencing a Mental Health or Behavioral Health Crisis or need someone to talk to.  The patient verbalized understanding of instructions, educational materials, and care plan provided today and DECLINED offer  to receive copy of patient instructions, educational materials, and care plan.   Patient provided contact information for Care Team.  LCSW gave client LCSW name and phone number and encouraged Carrie Case to call LCSW as needed for SW support   Carrie Case  MSW, LCSW Agency/Value Based Care [facility] Licensed Clinical Social Worker Direct Dial:  [phone] Fax:  [phone] Website:  [email or web address]

## 2023-12-27 ENCOUNTER — Institutional Professional Consult (permissible substitution): Payer: Self-pay | Admitting: Professional Counselor

## 2024-01-01 ENCOUNTER — Encounter: Payer: Self-pay | Admitting: Cardiovascular Disease

## 2024-01-20 ENCOUNTER — Ambulatory Visit: Attending: Nurse Practitioner

## 2024-01-20 DIAGNOSIS — I6523 Occlusion and stenosis of bilateral carotid arteries: Secondary | ICD-10-CM | POA: Diagnosis not present

## 2024-01-20 DIAGNOSIS — R0989 Other specified symptoms and signs involving the circulatory and respiratory systems: Secondary | ICD-10-CM | POA: Diagnosis not present

## 2024-01-22 ENCOUNTER — Ambulatory Visit: Payer: Self-pay | Admitting: Nurse Practitioner

## 2024-01-22 ENCOUNTER — Other Ambulatory Visit (HOSPITAL_COMMUNITY): Payer: Self-pay

## 2024-01-23 ENCOUNTER — Encounter: Payer: Self-pay | Admitting: Nurse Practitioner

## 2024-01-23 ENCOUNTER — Ambulatory Visit: Attending: Nurse Practitioner | Admitting: Nurse Practitioner

## 2024-01-23 VITALS — Ht 66.0 in | Wt 117.0 lb

## 2024-01-23 DIAGNOSIS — I771 Stricture of artery: Secondary | ICD-10-CM

## 2024-01-23 DIAGNOSIS — I4819 Other persistent atrial fibrillation: Secondary | ICD-10-CM

## 2024-01-23 DIAGNOSIS — E785 Hyperlipidemia, unspecified: Secondary | ICD-10-CM | POA: Diagnosis not present

## 2024-01-23 DIAGNOSIS — R5383 Other fatigue: Secondary | ICD-10-CM

## 2024-01-23 DIAGNOSIS — I5030 Unspecified diastolic (congestive) heart failure: Secondary | ICD-10-CM

## 2024-01-23 DIAGNOSIS — J449 Chronic obstructive pulmonary disease, unspecified: Secondary | ICD-10-CM

## 2024-01-23 DIAGNOSIS — I6523 Occlusion and stenosis of bilateral carotid arteries: Secondary | ICD-10-CM | POA: Diagnosis not present

## 2024-01-23 DIAGNOSIS — Z79899 Other long term (current) drug therapy: Secondary | ICD-10-CM | POA: Diagnosis not present

## 2024-01-23 DIAGNOSIS — I480 Paroxysmal atrial fibrillation: Secondary | ICD-10-CM

## 2024-01-23 NOTE — Progress Notes (Signed)
 Virtual Visit via Telephone Note   Because of [name] co-morbid illnesses, she is at least at moderate risk for complications without adequate follow up.  This format is felt to be most appropriate for this patient at this time.  The patient did not have access to video technology/had technical difficulties with video requiring transitioning to audio format only (telephone).  All issues noted in this document were discussed and addressed.  No physical exam could be performed with this format.  Please refer to the patient's chart for her consent to telehealth for [facility].    Date:  01/23/2024  ID:  Carrie Case, DOB 23-Jan-1956, MRN [id number] The patient was identified using 2 identifiers.  Patient Location: Home Provider Location: Office/Clinic   PCP:  Tobi Fortes, MD   [facility] Providers Cardiologist:  Armida Lander, MD     Evaluation Performed:  Follow-Up Visit  Chief Complaint:  Fatigue follow-up  History of Present Illness:    Carrie Case is a 68 y.o. female with with a PMH of chronic HFpEF, A-fib, PAD, HLD, carotid artery stenosis, tobacco use, and COPD, who presents today for scheduled follow-up.   Presented to [facility] on June 21, 2023 for A-fib with RVR, CHF, pneumonia, UTI, she was transferred to [facility] for further care.  BNP 746.  AST 109, ALT 52.  She was placed on amiodarone  drip and received Lasix .  Underwent DCCV on June 24, 2023.  The following day she went back to A-fib with RVR.  Also received 2 doses of digoxin , was restarted on Toprol  and also on Cardizem  CD3 100 mg daily at baseline.  Updated echo revealed EF 55 to 60%, mild to moderate AR, mild LVH and preserved RV function.  It was felt that CHF exacerbation was likely related to A-fib with RVR.  Was treated with antibiotics for CAP.  Last seen on 10/14/2023. Continued to note fatigue. Metoprolol  was stopped to improve her symptoms.    Today she presents for follow-up via telehealth visit. She says fatigue remains the same, has not stopped Digoxin  since last office visit. Denies any chest pain, shortness of breath, palpitations, syncope, presyncope, dizziness, orthopnea, PND, swelling or significant weight changes, acute bleeding, or claudication. Sadly, continues to remain under a lot of stress since last office visit. Daughter has been diagnosed with Stage IV cancer.   ROS: Negative. Seen HPI.    Prior CV studies:   The following studies were reviewed today:  Carotid duplex 01/2024: Summary:  Right Carotid: Velocities in the right ICA are consistent with a 1-39%  stenosis. Non-hemodynamically significant plaque <50% noted in the  CCA. The ECA appears <50% stenosed.   Left Carotid: Velocities in the left ICA are consistent with a 40-59%  stenosis. Non-hemodynamically significant plaque <50% noted in the  CCA. The ECA appears <50% stenosed.   Vertebrals:  Right vertebral artery demonstrates antegrade flow. Left  vertebral artery demonstrates bidirectional flow.  Subclavians: Right subclavian artery flow was disturbed. Left subclavian  flow was biphasic.   *See table(s) above for measurements and observations.  Suggest follow up study in 12 months.  Echo 06/2023: 1. Left ventricular ejection fraction, by estimation, is 55 to 60%. The  left ventricle has normal function. The left ventricle has no regional  wall motion abnormalities. There is mild left ventricular hypertrophy.  Left ventricular diastolic function could not be evaluated.   2. Right ventricular systolic function is normal. The right  ventricular  size is normal. There is moderately elevated pulmonary artery systolic  pressure.   3. Left atrial size was moderately dilated.   4. Right atrial size was moderately dilated.   5. The mitral valve is normal in structure. Trivial mitral valve  regurgitation. No evidence of mitral stenosis. Moderate mitral  annular  calcification.   6. The aortic valve is tricuspid. Aortic valve regurgitation is mild to  moderate. Aortic valve sclerosis/calcification is present, without any  evidence of aortic stenosis.   7. The inferior vena cava is normal in size with greater than 50%  respiratory variability, suggesting right atrial pressure of 3 mmHg.  Echo 10/2022:  1. Left ventricular ejection fraction, by estimation, is 55 to 60%. The  left ventricle has normal function. The left ventricle has no regional  wall motion abnormalities. There is mild concentric left ventricular  hypertrophy. Left ventricular diastolic  parameters are indeterminate. The average left ventricular global  longitudinal strain is -19.6 %. The global longitudinal strain is normal.   2. Right ventricular systolic function is low normal. The right  ventricular size is normal. There is normal pulmonary artery systolic  pressure. The estimated right ventricular systolic pressure is 35.7 mmHg.   3. Left atrial size was severely dilated.   4. Right atrial size was moderately dilated.   5. The mitral valve is degenerative. Mild mitral valve regurgitation.  Moderate mitral annular calcification.   6. The aortic valve is tricuspid. There is mild calcification of the  aortic valve. Aortic valve regurgitation is mild. Aortic valve sclerosis  is present, with no evidence of aortic valve stenosis. Aortic  regurgitation PHT measures 560 msec.   7. The inferior vena cava is normal in size with greater than 50%  respiratory variability, suggesting right atrial pressure of 3 mmHg.   Comparison(s): Prior images reviewed side by side. LVEF remains normal range at 55-60%.  Labs/Other Tests and Data Reviewed:    EKG:    Recent Labs: 06/22/2023: B Natriuretic Peptide 746.2; TSH 0.905 06/27/2023: Magnesium  1.8 07/05/2023: ALT 35; BUN 8; Creatinine, Ser 0.89; Hemoglobin 15.3; Platelets 337; Potassium 3.9; Sodium 138   Recent Lipid Panel Lab  Results  Component Value Date/Time   CHOL 59 06/22/2023 11:54 AM   CHOL 145 02/14/2022 04:45 PM   TRIG 75 06/22/2023 11:54 AM   HDL 20 (L) 06/22/2023 11:54 AM   HDL 47 02/14/2022 04:45 PM   CHOLHDL 3.0 06/22/2023 11:54 AM   LDLCALC 24 06/22/2023 11:54 AM   LDLCALC 80 02/14/2022 04:45 PM    Wt Readings from Last 3 Encounters:  01/23/24 117 lb (53.1 kg)  12/16/23 118 lb 9.6 oz (53.8 kg)  10/14/23 121 lb (54.9 kg)     Risk Assessment/Calculations:    CHA2DS2-VASc Score = 3  This indicates a 3.2% annual risk of stroke. The patient's score is based upon: CHF History: 0 HTN History: 0 Diabetes History: 0 Stroke History: 0 Vascular Disease History: 1 Age Score: 1 Gender Score: 1   STOP-Bang Score:  3  {  Objective:    Vital Signs:  Ht 5' 6 (1.676 m)   Wt 117 lb (53.1 kg)   BMI 18.88 kg/m    Due to nature of today's visit a physical exam was unable to be performed.   ASSESSMENT & PLAN:    HFpEF Stage C, NYHA class I-II symptoms. EF 55-60% 06/2023. Euvolemic and well compensated on exam. Stopping Digoxin . Continue rest of medication regimen. Low sodium diet, fluid  restriction <2L, and daily weights encouraged. Educated to contact our office for weight gain of 2 lbs overnight or 5 lbs in one week.  PAF, fatigue Denies any palpitations or tachycardia.  Continue Amiodarone  and Xarelto  20 mg daily.  Continue Xarelto  for stroke prevention.  Will stop Digoxin  to see if this improves her symptoms. She denies any bleeding issues and is on appropriate dosage of Xarelto .  Heart healthy diet and regular cardiovascular exercise encouraged. Care and ED precautions discussed. Obtaining labs.   HLD, carotid artery stenosis, left subclavian stenosis Denies any  symptoms.  Asymptomatic left subclavian stenosis, bilateral 1 to 39% ICA stenosis.  LDL 24 06/2023.  See most recent carotid duplex above. Continue rosuvastatin .  Continue to follow-up with Dr. Alvenia Aus.   COPD Denies any recent  symptoms.  Continue current medication regimen.  Continue follow-up with Dr. Waymond Hailey.   5. Fatigue Etiology multifactorial. Stopping Digoxin  as noted above - she will update us  in 2 weeks regarding her symptoms. Recommended to continue to f/u with PCP.  Care and ED precautions discussed. Will obtain CBC, CMET, thyroid  panel, and Mag.  Time:   Today, I have spent 7 minutes with the patient with telehealth technology discussing the above problems.     Medication Adjustments/Labs and Tests Ordered: Current medicines are reviewed at length with the patient today.  Concerns regarding medicines are outlined above.   Tests Ordered: Orders Placed This Encounter  Procedures   CBC   Comprehensive metabolic panel with GFR   Thyroid  Panel With TSH   Magnesium     Medication Changes: No orders of the defined types were placed in this encounter.   Follow Up:  In Person in 6 month(s)  Signed, Lasalle Pointer, NP

## 2024-01-23 NOTE — Patient Instructions (Addendum)
 Medication Instructions:   Stop Digoxin   Continue all other medications.     Labwork:  CBC, CMET, Thyroid  panel, Mg - orders mailed to patient.   Office will contact with results via phone, letter or mychart.     Testing/Procedures:  none  Follow-Up:  6 months   Any Other Special Instructions Will Be Listed Below (If Applicable).  1 week nurse visit for EKG   If you need a refill on your cardiac medications before your next appointment, please call your pharmacy.

## 2024-01-24 ENCOUNTER — Other Ambulatory Visit: Payer: Self-pay

## 2024-01-24 ENCOUNTER — Other Ambulatory Visit: Payer: Self-pay | Admitting: Pharmacy Technician

## 2024-01-28 ENCOUNTER — Other Ambulatory Visit: Payer: Self-pay

## 2024-01-28 ENCOUNTER — Other Ambulatory Visit: Payer: Self-pay | Admitting: Nurse Practitioner

## 2024-01-28 DIAGNOSIS — I6523 Occlusion and stenosis of bilateral carotid arteries: Secondary | ICD-10-CM

## 2024-01-28 NOTE — Progress Notes (Signed)
 Re-timing next call since patient still has initial injection at office.

## 2024-01-29 ENCOUNTER — Other Ambulatory Visit (HOSPITAL_COMMUNITY): Payer: Self-pay

## 2024-01-29 ENCOUNTER — Telehealth: Payer: Self-pay | Admitting: Nurse Practitioner

## 2024-01-29 ENCOUNTER — Other Ambulatory Visit: Payer: Self-pay | Admitting: Nurse Practitioner

## 2024-01-29 DIAGNOSIS — I4819 Other persistent atrial fibrillation: Secondary | ICD-10-CM

## 2024-01-29 MED ORDER — AMIODARONE HCL 200 MG PO TABS
200.0000 mg | ORAL_TABLET | Freq: Every day | ORAL | 2 refills | Status: DC
  Filled 2024-01-29: qty 90, 90d supply, fill #0

## 2024-01-29 NOTE — Telephone Encounter (Signed)
 Pt's medication was sent to pt's pharmacy as requested. Confirmation received.

## 2024-01-29 NOTE — Telephone Encounter (Signed)
*  STAT* If patient is at the pharmacy, call can be transferred to refill team.   1. Which medications need to be refilled? (please list name of each medication and dose if known) amiodarone  (PACERONE ) 200 MG tablet    2. Would you like to learn more about the convenience, safety, & potential cost savings by using the [facility]?      3. Are you open to using the [facility] (Type [facility]. ).   4. Which pharmacy/location (including street and city if local pharmacy) is medication to be sent to? [place] [facility]    5. Do they need a 30 day or 90 day supply? 90 day

## 2024-01-29 NOTE — Addendum Note (Signed)
 Addended by: Lasalle Pointer on: 01/29/2024 09:18 PM   Modules accepted: Level of Service

## 2024-01-30 ENCOUNTER — Other Ambulatory Visit (HOSPITAL_COMMUNITY): Payer: Self-pay

## 2024-01-30 ENCOUNTER — Other Ambulatory Visit: Payer: Self-pay | Admitting: Nurse Practitioner

## 2024-01-30 ENCOUNTER — Other Ambulatory Visit: Payer: Self-pay

## 2024-01-30 DIAGNOSIS — I6523 Occlusion and stenosis of bilateral carotid arteries: Secondary | ICD-10-CM

## 2024-01-30 DIAGNOSIS — I4819 Other persistent atrial fibrillation: Secondary | ICD-10-CM

## 2024-01-30 MED ORDER — ROSUVASTATIN CALCIUM 20 MG PO TABS
20.0000 mg | ORAL_TABLET | Freq: Every day | ORAL | 3 refills | Status: AC
  Filled 2024-01-30: qty 90, 90d supply, fill #0
  Filled 2024-04-27: qty 90, 90d supply, fill #1
  Filled 2024-07-26: qty 90, 90d supply, fill #2

## 2024-01-30 MED ORDER — RIVAROXABAN 20 MG PO TABS
20.0000 mg | ORAL_TABLET | Freq: Every day | ORAL | 1 refills | Status: DC
  Filled 2024-01-30: qty 90, 90d supply, fill #0

## 2024-01-30 NOTE — Telephone Encounter (Signed)
*  STAT* If patient is at the pharmacy, call can be transferred to refill team.   1. Which medications need to be refilled? (please list name of each medication and dose if known)     rivaroxaban  (XARELTO ) 20 MG TABS tablet Take 1 tablet (20 mg total) by mouth daily with supper.   rosuvastatin  (CRESTOR ) 20 MG tablet Take 1 tablet (20 mg total) by mouth daily.    4. Which pharmacy/location (including street and city if local pharmacy) is medication to be sent to?  [facility]     5. Do they need a 30 day or 90 day supply? 90

## 2024-01-30 NOTE — Telephone Encounter (Signed)
 RX for Rosuvastatin  sent to requested Pharmacy. RX for Xeralto routed to [place].

## 2024-01-30 NOTE — Telephone Encounter (Signed)
 Prescription refill request for Xarelto  received.  Indication: afib  Last office visit: Clementine Cutting 12/16/2023 Weight: 53.1 kg  Age: 68 yo  Scr: 0.89, 07/05/2023 CrCl: 52 ml/min   Refill sent.

## 2024-01-31 ENCOUNTER — Ambulatory Visit: Attending: Internal Medicine | Admitting: *Deleted

## 2024-01-31 ENCOUNTER — Other Ambulatory Visit: Payer: Self-pay

## 2024-01-31 ENCOUNTER — Other Ambulatory Visit (HOSPITAL_COMMUNITY): Payer: Self-pay

## 2024-01-31 DIAGNOSIS — I480 Paroxysmal atrial fibrillation: Secondary | ICD-10-CM

## 2024-01-31 NOTE — Progress Notes (Signed)
 EKG looks good. She is in NSR. No medication changes at this time. Recommend to continue to follow-up with PCP for further evaluation of her symptoms. For any sudden/severe CP or SHOB or any other symptoms, recommend ED evaluation.   Thanks!   Best, Lasalle Pointer, NP

## 2024-01-31 NOTE — Patient Instructions (Signed)
 Your physician recommends that you continue on your current medications as directed. Please refer to the Current Medication list given to you today. Please have lab work done soon

## 2024-01-31 NOTE — Progress Notes (Signed)
 Presents for nurse visit to have an EKG per last OV and stopping digoxin . Reports she feels fine. Denies dizziness or chest pain. Reports she is always SOB and is unchanged. Medications reviewed.Reports taking all doses of medications without side effects. EKG done and routed to provider for review.  Reports she will have lab work done on Monday, February 03, 2024 at [facility]. EKG sent to provider for review.

## 2024-02-03 NOTE — Progress Notes (Signed)
 Patient informed and verbalized understanding of plan.

## 2024-02-11 ENCOUNTER — Other Ambulatory Visit: Payer: Self-pay

## 2024-02-11 ENCOUNTER — Other Ambulatory Visit: Payer: Self-pay | Admitting: Licensed Clinical Social Worker

## 2024-02-11 NOTE — Patient Outreach (Signed)
 Complex Care Management   Visit Note  02/11/2024  Name:  Carrie Case MRN: [id number] DOB: 02/02/1956  Situation: Referral received for Complex Care Management related to management of anxiety and depression issues  I obtained verbal consent from Patient.  Visit completed with patient  on the phone  Background:   Past Medical History:  Diagnosis Date   A-fib [facility])    Acute respiratory failure with hypoxia (HCC) 12/22/2018   Carotid artery stenosis    CHF (congestive heart failure) (HCC)    COPD (chronic obstructive pulmonary disease) (HCC)    History of home oxygen therapy    HLD (hyperlipidemia)    Memory changes    Osteoporosis    PAD (peripheral artery disease) (HCC)    Recurrent falls    Tobacco dependence     Assessment: Patient Reported Symptoms:  Cognitive Cognitive Status: Alert and oriented to person, place, and time   Health Maintenance Behaviors: Stress management  Neurological Neurological Review of Symptoms: Vision changes Neurological Management Strategies: Coping strategies Neurological Comment: decreased vision  HEENT HEENT Symptoms Reported:  (client has dentures) HEENT Management Strategies: Coping strategies    Cardiovascular Cardiovascular Symptoms Reported: Fatigue Cardiovascular Management Strategies: Coping strategies  Respiratory Respiratory Symptoms Reported: Dry cough, Shortness of breath Respiratory Conditions: COPD  Endocrine Patient reports the following symptoms related to hypoglycemia or hyperglycemia : Blurry vision, Shortness of breath, Weakness or fatigue Endocrine Management Strategies: Coping strategies  Gastrointestinal Gastrointestinal Symptoms Reported: No symptoms reported Gastrointestinal Management Strategies: Coping strategies Nutrition Risk Screen (CP): No indicators present (client said she has lost weight in past 3 months)  Genitourinary Genitourinary Symptoms Reported: No symptoms reported Genitourinary Management  Strategies: Coping strategies  Integumentary   Skin Conditions:  (client has dry skin, sometimes has red marks on her skin. She said red marks started when she started to take blood thinner) Skin Management Strategies: Coping strategies  Musculoskeletal Musculoskelatal Symptoms Reviewed: Difficulty walking, Weakness Musculoskeletal Conditions: Back pain, Unsteady gait Musculoskeletal Management Strategies: Coping strategies Falls in the past year?: No    Psychosocial Psychosocial Symptoms Reported: Anxiety - if selected complete GAD, Depression - if selected complete PHQ 2-9, Sadness - if selected complete PHQ 2-9 Behavioral Health Conditions: Depression Behavioral Management Strategies: Community resources (Has received help annually from [facility] program support) Major Change/Loss/Stressor/Fears (CP): Traumatic event (daughter of client has medical illness) Techniques to [employer] with Loss/Stress/Change: Counseling Quality of Family Relationships: supportive Do you feel physically threatened by others?: No      02/11/2024    9:29 AM  Depression screen PHQ 2/9  Decreased Interest 1  Down, Depressed, Hopeless 1  PHQ - 2 Score 2  Altered sleeping 2  Tired, decreased energy 2  Change in appetite 1  Feeling bad or failure about yourself  1  Trouble concentrating 1  Moving slowly or fidgety/restless 1  Suicidal thoughts 0  PHQ-9 Score 10  Difficult doing work/chores Very difficult    Vitals:  BP within normal range per client  Medications Reviewed Today     Reviewed by Frances Ozell GORMAN KEN (Social Worker) on 02/11/24 at [phone]  Med List Status: <None>   Medication Order Taking? Sig Documenting Provider Last Dose Status Informant  albuterol  (VENTOLIN  HFA) 108 (90 Base) MCG/ACT inhaler [id number] Yes Inhale 2 puffs into the lungs every 6 (six) hours as needed for wheezing or shortness of breath. Melvenia Manus BRAVO, MD  Active   amiodarone  (PACERONE ) 200 MG tablet [id number] unknown Take 1  tablet (200  mg total) by mouth daily. Miriam Norris, NP  Active   furosemide  (LASIX ) 20 MG tablet [id number] yes Take 1 tablet (20 mg total) by mouth as needed. For increased swelling, weight gain of 3 LB in 1 day or 5 LB in 1 week  Patient not taking: Reported on 02/11/2024   Fairy Frames, MD  Active   rivaroxaban  (XARELTO ) 20 MG TABS tablet [id number] Yes Take 1 tablet (20 mg total) by mouth daily with supper. Miriam Norris, NP  Active   rosuvastatin  (CRESTOR ) 20 MG tablet [id number] Yes Take 1 tablet (20 mg total) by mouth daily. Darron Deatrice LABOR, MD  Active   Tiotropium Bromide -Olodaterol (STIOLTO RESPIMAT ) 2.5-2.5 MCG/ACT AERS [id number] Yes Inhale 2 puffs into the lungs in the morning. Darlean Ozell NOVAK, MD  Active             Recommendation:   PCP Follow-up Continue Current Plan of Care Take medications as prescribed Communicate with representative of [facility] as needed Call LCSW as needed for SW support at [phone]  Follow Up Plan:   Telephone follow up appointment date/time:  03/16/24 at 9:30 AM   Glendia Pear  MSW, LCSW [place]/[facility] Licensed Clinical Social Worker Direct Dial:  [phone] Fax:  [phone] Website:  [email or web address]

## 2024-02-11 NOTE — Patient Instructions (Addendum)
 Visit Information  Thank you for taking time to visit with me today. Please don't hesitate to contact me if I can be of assistance to you before our next scheduled appointment.  Our next appointment is by telephone on 03/16/24 at 9:30 AM   Please call the care guide team at [phone] if you need to cancel or reschedule your appointment.   Following is a copy of your care plan:   Goals Addressed             This Visit's Progress    VBCI Social Work Care Plan       Problems:   Transportation challenges (LCSW gave client the name and phone number of [facility] transport services)             Back pain issues             Needs extra time to complete ADLs            Stress related to current illness of her daughter            Insomnia            Vision challenges (blurry vision due to cataracts. Cannot afford to have cataract procedure)  CSW Clinical Goal(s):   Over the next   30   days the Patient will attend all scheduled medical appointments as evidenced by patient report and care team review of appointment completion in electronic MEDICAL RECORD NUMBER .            Over next 30 days patient will communicate with representative for [facility] to discuss in home resources for vision needs of client AEB patient report of communication with [facility] representative   Interventions:  Spoke with Carrie Case about her status and needs             Carrie Case said her daughter has serious medical issues and Carrie Case is stressed related to current medical issues of her daughter            Discussed medication procurement of client            Discussed ambulation of client . Discussed client transport needs. Carrie Case must now make her own transport arrangements to appointments for client. LCSW gave Carrie Case the name and phone number for local [facility] transport agency and encouraged client to call [facility] to make needed transport arrangements for client           Discussed pain issues of  client; discussed sleeping issues of client           Discussed vision needs of client. Discussed [facility] as a resource that may be able to help client with in home equipment to help her with vision needs. Carrie agreed for representative of [facility] to call her to talk about agency support            Discussed mood of client. Completed PHQ 2/9; completed GAD-7. Discussed coping skills of client to address mood issues            Provided counseling support             LCSW called Carrie Case with [facility] today and made referral of client to [facility]. Carrie said representative would contact client soon to discuss agency services            Encouraged client to call LCSW as needed for  SW support.at [phone]  Patient Goals/Self-Care Activities:  Take medications as prescribed             Make needed transport arrangements for client to attend her upcoming medical appointments             Attend scheduled medical appointments            Allow time to complete ADLs             Allow time for rest and self care             Communicate as needed with representative of [facility]              Call LCSW as needed for SW support at [phone]  Plan:   LCSW to call client on 03/16/24 at 9:30 AM          Please call the [facility]: [phone] if you are experiencing a Mental Health or Behavioral Health Crisis or need someone to talk to.  The patient verbalized understanding of instructions, educational materials, and care plan provided today and DECLINED offer to receive copy of patient instructions, educational materials, and care plan.    Carrie Case  MSW, LCSW [place] Licensed Clinical Social Worker Direct Dial:  [phone] Fax:  [phone] Website:  [email or web address]

## 2024-02-20 DIAGNOSIS — R918 Other nonspecific abnormal finding of lung field: Secondary | ICD-10-CM | POA: Diagnosis not present

## 2024-02-20 DIAGNOSIS — Z9989 Dependence on other enabling machines and devices: Secondary | ICD-10-CM | POA: Diagnosis not present

## 2024-02-20 DIAGNOSIS — I4891 Unspecified atrial fibrillation: Secondary | ICD-10-CM | POA: Diagnosis not present

## 2024-02-20 DIAGNOSIS — Z720 Tobacco use: Secondary | ICD-10-CM | POA: Diagnosis not present

## 2024-02-20 DIAGNOSIS — Z452 Encounter for adjustment and management of vascular access device: Secondary | ICD-10-CM | POA: Diagnosis not present

## 2024-02-20 DIAGNOSIS — R0989 Other specified symptoms and signs involving the circulatory and respiratory systems: Secondary | ICD-10-CM | POA: Diagnosis not present

## 2024-02-20 DIAGNOSIS — R000 Tachycardia, unspecified: Secondary | ICD-10-CM | POA: Diagnosis not present

## 2024-02-20 DIAGNOSIS — I5032 Chronic diastolic (congestive) heart failure: Secondary | ICD-10-CM | POA: Diagnosis not present

## 2024-02-20 DIAGNOSIS — R590 Localized enlarged lymph nodes: Secondary | ICD-10-CM | POA: Diagnosis not present

## 2024-02-20 DIAGNOSIS — Z79899 Other long term (current) drug therapy: Secondary | ICD-10-CM | POA: Diagnosis not present

## 2024-02-20 DIAGNOSIS — Z20822 Contact with and (suspected) exposure to covid-19: Secondary | ICD-10-CM | POA: Diagnosis not present

## 2024-02-20 DIAGNOSIS — R059 Cough, unspecified: Secondary | ICD-10-CM | POA: Diagnosis not present

## 2024-02-20 DIAGNOSIS — E876 Hypokalemia: Secondary | ICD-10-CM | POA: Diagnosis not present

## 2024-02-20 DIAGNOSIS — Z7901 Long term (current) use of anticoagulants: Secondary | ICD-10-CM | POA: Diagnosis not present

## 2024-02-20 DIAGNOSIS — R062 Wheezing: Secondary | ICD-10-CM | POA: Diagnosis not present

## 2024-02-20 DIAGNOSIS — K820 Obstruction of gallbladder: Secondary | ICD-10-CM | POA: Diagnosis not present

## 2024-02-20 DIAGNOSIS — D72829 Elevated white blood cell count, unspecified: Secondary | ICD-10-CM | POA: Diagnosis not present

## 2024-02-20 DIAGNOSIS — Z1152 Encounter for screening for COVID-19: Secondary | ICD-10-CM | POA: Diagnosis not present

## 2024-02-20 DIAGNOSIS — I5030 Unspecified diastolic (congestive) heart failure: Secondary | ICD-10-CM | POA: Diagnosis not present

## 2024-02-20 DIAGNOSIS — F17210 Nicotine dependence, cigarettes, uncomplicated: Secondary | ICD-10-CM | POA: Diagnosis not present

## 2024-02-20 DIAGNOSIS — J9602 Acute respiratory failure with hypercapnia: Secondary | ICD-10-CM | POA: Diagnosis not present

## 2024-02-20 DIAGNOSIS — I959 Hypotension, unspecified: Secondary | ICD-10-CM | POA: Diagnosis not present

## 2024-02-20 DIAGNOSIS — Z7952 Long term (current) use of systemic steroids: Secondary | ICD-10-CM | POA: Diagnosis not present

## 2024-02-20 DIAGNOSIS — Z5941 Food insecurity: Secondary | ICD-10-CM | POA: Diagnosis not present

## 2024-02-20 DIAGNOSIS — R0902 Hypoxemia: Secondary | ICD-10-CM | POA: Diagnosis not present

## 2024-02-20 DIAGNOSIS — J432 Centrilobular emphysema: Secondary | ICD-10-CM | POA: Diagnosis not present

## 2024-02-20 DIAGNOSIS — Z888 Allergy status to other drugs, medicaments and biological substances status: Secondary | ICD-10-CM | POA: Diagnosis not present

## 2024-02-20 DIAGNOSIS — E785 Hyperlipidemia, unspecified: Secondary | ICD-10-CM | POA: Diagnosis not present

## 2024-02-20 DIAGNOSIS — R9082 White matter disease, unspecified: Secondary | ICD-10-CM | POA: Diagnosis not present

## 2024-02-20 DIAGNOSIS — J441 Chronic obstructive pulmonary disease with (acute) exacerbation: Secondary | ICD-10-CM | POA: Diagnosis not present

## 2024-02-20 DIAGNOSIS — R0600 Dyspnea, unspecified: Secondary | ICD-10-CM | POA: Diagnosis not present

## 2024-02-20 DIAGNOSIS — Z792 Long term (current) use of antibiotics: Secondary | ICD-10-CM | POA: Diagnosis not present

## 2024-02-20 DIAGNOSIS — K838 Other specified diseases of biliary tract: Secondary | ICD-10-CM | POA: Diagnosis not present

## 2024-02-20 DIAGNOSIS — J440 Chronic obstructive pulmonary disease with acute lower respiratory infection: Secondary | ICD-10-CM | POA: Diagnosis not present

## 2024-02-20 DIAGNOSIS — Z602 Problems related to living alone: Secondary | ICD-10-CM | POA: Diagnosis not present

## 2024-02-20 DIAGNOSIS — J9601 Acute respiratory failure with hypoxia: Secondary | ICD-10-CM | POA: Diagnosis not present

## 2024-02-20 DIAGNOSIS — G9389 Other specified disorders of brain: Secondary | ICD-10-CM | POA: Diagnosis not present

## 2024-02-20 DIAGNOSIS — J984 Other disorders of lung: Secondary | ICD-10-CM | POA: Diagnosis not present

## 2024-02-20 DIAGNOSIS — Z9981 Dependence on supplemental oxygen: Secondary | ICD-10-CM | POA: Diagnosis not present

## 2024-03-05 ENCOUNTER — Other Ambulatory Visit: Payer: Self-pay

## 2024-03-05 ENCOUNTER — Other Ambulatory Visit (HOSPITAL_COMMUNITY): Payer: Self-pay

## 2024-03-05 ENCOUNTER — Telehealth: Payer: Self-pay | Admitting: Nurse Practitioner

## 2024-03-05 ENCOUNTER — Other Ambulatory Visit: Payer: Self-pay | Admitting: Nurse Practitioner

## 2024-03-05 DIAGNOSIS — I480 Paroxysmal atrial fibrillation: Secondary | ICD-10-CM

## 2024-03-05 DIAGNOSIS — I5030 Unspecified diastolic (congestive) heart failure: Secondary | ICD-10-CM

## 2024-03-05 DIAGNOSIS — Z79899 Other long term (current) drug therapy: Secondary | ICD-10-CM

## 2024-03-05 MED ORDER — XARELTO 20 MG PO TABS
20.0000 mg | ORAL_TABLET | Freq: Every day | ORAL | 3 refills | Status: AC
  Filled 2024-03-05 – 2024-04-07 (×7): qty 30, 30d supply, fill #0
  Filled 2024-05-02: qty 30, 30d supply, fill #1
  Filled 2024-06-01: qty 30, 30d supply, fill #2
  Filled 2024-07-01: qty 30, 30d supply, fill #3

## 2024-03-05 MED ORDER — ALBUTEROL SULFATE HFA 108 (90 BASE) MCG/ACT IN AERS
2.0000 | INHALATION_SPRAY | RESPIRATORY_TRACT | 3 refills | Status: DC | PRN
  Filled 2024-03-05: qty 6.7, 17d supply, fill #0
  Filled 2024-03-05: qty 6.7, 16d supply, fill #0

## 2024-03-05 MED ORDER — METOPROLOL SUCCINATE ER 25 MG PO TB24
12.5000 mg | ORAL_TABLET | Freq: Every day | ORAL | 3 refills | Status: DC
  Filled 2024-03-05 (×2): qty 45, 90d supply, fill #0
  Filled 2024-04-21 – 2024-04-29 (×2): qty 45, 90d supply, fill #1

## 2024-03-05 MED ORDER — HYDROXYZINE HCL 25 MG PO TABS
25.0000 mg | ORAL_TABLET | Freq: Four times a day (QID) | ORAL | 1 refills | Status: DC | PRN
  Filled 2024-03-05 (×2): qty 120, 30d supply, fill #0

## 2024-03-05 MED ORDER — MELATONIN 10 MG PO TABS
10.0000 mg | ORAL_TABLET | Freq: Every morning | ORAL | 3 refills | Status: DC
  Filled 2024-03-05: qty 30, 30d supply, fill #0
  Filled 2024-03-05: qty 120, 120d supply, fill #0

## 2024-03-05 MED ORDER — DIGOXIN 125 MCG PO TABS
62.5000 ug | ORAL_TABLET | Freq: Every day | ORAL | 3 refills | Status: DC
  Filled 2024-03-05 (×2): qty 15, 30d supply, fill #0

## 2024-03-05 MED ORDER — AMIODARONE HCL 200 MG PO TABS
200.0000 mg | ORAL_TABLET | Freq: Two times a day (BID) | ORAL | 11 refills | Status: DC
  Filled 2024-03-05 – 2024-03-17 (×4): qty 60, 30d supply, fill #0
  Filled 2024-04-11: qty 60, 30d supply, fill #1

## 2024-03-05 MED ORDER — DIGOXIN 62.5 MCG PO TABS: 62.5000 ug | ORAL_TABLET | Freq: Every day | ORAL | 3 refills | Status: DC

## 2024-03-05 MED ORDER — DIGOXIN 62.5 MCG PO TABS
62.5000 ug | ORAL_TABLET | Freq: Every day | ORAL | 3 refills | Status: AC
  Filled 2024-03-05: qty 30, 30d supply, fill #0
  Filled 2024-03-31: qty 30, 30d supply, fill #1
  Filled 2024-04-29: qty 30, 30d supply, fill #2
  Filled 2024-05-26: qty 30, 30d supply, fill #3

## 2024-03-05 NOTE — Telephone Encounter (Signed)
 Message sent via secure chat that its fine to change brands

## 2024-03-05 NOTE — Telephone Encounter (Signed)
 Rx faxed to [facility] to be mailed per patient request patient does not have transportation.

## 2024-03-05 NOTE — Telephone Encounter (Signed)
 Pt c/o medication issue:  1. Name of Medication:  Digoxin  62.5 MCG TABS  2. How are you currently taking this medication (dosage and times per day)?   3. Are you having a reaction (difficulty breathing--STAT)?   4. What is your medication issue?   Sushama with [facility] says Rx received was with [employer] brand, but their Digoxin  is on recall. She would like to know if they can use [employer] or [employer] instead. Please advise.

## 2024-03-05 NOTE — Telephone Encounter (Signed)
 Per [employer]! We will need to send a script in for Digoxin  for Carrie Case. The prescription will need to have a note by me. If you can print the ones where I sign and I will put the note on there. Thanks!  Script is for Digoxin  62.5 mcg by mouth daily. Just like this is written in her chart. I would also like a Digoxin  level and BMET checked in [redacted] week along with EKG after she has started this. Thanks!   Patient informed and verbalized understanding of plan. Labs sent to [facility] and sent for scheduling and Rx given to provider to complete

## 2024-03-06 ENCOUNTER — Other Ambulatory Visit (HOSPITAL_COMMUNITY): Payer: Self-pay

## 2024-03-06 ENCOUNTER — Other Ambulatory Visit: Payer: Self-pay

## 2024-03-06 ENCOUNTER — Encounter: Payer: Self-pay | Admitting: Pharmacist

## 2024-03-06 DIAGNOSIS — J441 Chronic obstructive pulmonary disease with (acute) exacerbation: Secondary | ICD-10-CM | POA: Diagnosis not present

## 2024-03-06 DIAGNOSIS — R0902 Hypoxemia: Secondary | ICD-10-CM | POA: Diagnosis not present

## 2024-03-10 ENCOUNTER — Inpatient Hospital Stay: Payer: Self-pay | Admitting: Nurse Practitioner

## 2024-03-10 ENCOUNTER — Other Ambulatory Visit (HOSPITAL_BASED_OUTPATIENT_CLINIC_OR_DEPARTMENT_OTHER): Payer: Self-pay

## 2024-03-10 ENCOUNTER — Other Ambulatory Visit (HOSPITAL_COMMUNITY): Payer: Self-pay

## 2024-03-11 DIAGNOSIS — Z79899 Other long term (current) drug therapy: Secondary | ICD-10-CM | POA: Diagnosis not present

## 2024-03-11 DIAGNOSIS — Z5181 Encounter for therapeutic drug level monitoring: Secondary | ICD-10-CM | POA: Diagnosis not present

## 2024-03-11 DIAGNOSIS — J9601 Acute respiratory failure with hypoxia: Secondary | ICD-10-CM | POA: Diagnosis not present

## 2024-03-12 ENCOUNTER — Telehealth: Payer: Self-pay | Admitting: Internal Medicine

## 2024-03-12 NOTE — Telephone Encounter (Signed)
 Copied from CRM [id number]. Topic: Clinical - Request for Lab/Test Order >> Mar 12, 2024  1:08 PM Carrie Case wrote: Reason for CRM: Patient states her insurance wants orders faxed over stating that she needs physical therapy to come to her home. Was in the hospital and has a home health nurse but states she needs PT, she is unable to walk from being in bed too long. Coming in for a hosp follow up on 07/30.  Patient can be reached at [phone]

## 2024-03-13 ENCOUNTER — Other Ambulatory Visit: Payer: Self-pay

## 2024-03-13 ENCOUNTER — Telehealth: Payer: Self-pay

## 2024-03-13 DIAGNOSIS — J9621 Acute and chronic respiratory failure with hypoxia: Secondary | ICD-10-CM

## 2024-03-13 DIAGNOSIS — I5033 Acute on chronic diastolic (congestive) heart failure: Secondary | ICD-10-CM

## 2024-03-13 NOTE — Telephone Encounter (Signed)
 Called pt to inform her that [facility] put in orders for PT to come to her house

## 2024-03-13 NOTE — Telephone Encounter (Signed)
 Left voicemail to call back, reason for call documented in her chart.

## 2024-03-16 ENCOUNTER — Other Ambulatory Visit: Payer: Self-pay

## 2024-03-16 ENCOUNTER — Other Ambulatory Visit (HOSPITAL_COMMUNITY): Payer: Self-pay

## 2024-03-16 ENCOUNTER — Other Ambulatory Visit: Payer: Self-pay | Admitting: Licensed Clinical Social Worker

## 2024-03-16 DIAGNOSIS — Z79899 Other long term (current) drug therapy: Secondary | ICD-10-CM | POA: Diagnosis not present

## 2024-03-16 NOTE — Patient Outreach (Signed)
 Complex Care Management   Visit Note  03/16/2024  Name:  Carrie Case MRN: [id number] DOB: Feb 01, 1956  Situation: Referral received for Complex Care Management related to depression, substance use challenges I obtained verbal consent from Patient.  Visit completed with patient  on the phone  Background:   Past Medical History:  Diagnosis Date   A-fib [facility])    Acute respiratory failure with hypoxia (HCC) 12/22/2018   Carotid artery stenosis    CHF (congestive heart failure) (HCC)    COPD (chronic obstructive pulmonary disease) (HCC)    History of home oxygen therapy    HLD (hyperlipidemia)    Memory changes    Osteoporosis    PAD (peripheral artery disease) (HCC)    Recurrent falls    Tobacco dependence     Assessment: Patient Reported Symptoms:  Cognitive Cognitive Status: Alert and oriented to person, place, and time Cognitive/Intellectual Conditions Management [RPT]: None reported or documented in medical history or problem list   Health Maintenance Behaviors: Sleep adequate, Stress management  Neurological Neurological Review of Symptoms: Vision changes (blurry vision) Neurological Management Strategies: Coping strategies  HEENT HEENT Symptoms Reported: No symptoms reported HEENT Management Strategies: Coping strategies    Cardiovascular Cardiovascular Symptoms Reported: Fatigue Does patient have uncontrolled Hypertension?: No Cardiovascular Management Strategies: Coping strategies  Respiratory Respiratory Symptoms Reported: Dry cough, Shortness of breath Respiratory Management Strategies: Coping strategies, Adequate rest  Endocrine Endocrine Symptoms Reported: Blurry vision, Weakness or fatigue, Shortness of breath    Gastrointestinal Gastrointestinal Symptoms Reported: No symptoms reported Gastrointestinal Management Strategies: Coping strategies, Adequate rest    Genitourinary Genitourinary Symptoms Reported: No symptoms reported Genitourinary Management  Strategies: Coping strategies  Integumentary Integumentary Symptoms Reported: No symptoms reported Skin Management Strategies: Coping strategies  Musculoskeletal Musculoskelatal Symptoms Reviewed: Unsteady gait, Limited mobility, Difficulty walking, Weakness Musculoskeletal Management Strategies: Adequate rest, Coping strategies      Psychosocial Psychosocial Symptoms Reported: Anxiety - if selected complete GAD, Sadness - if selected complete PHQ 2-9, Depression - if selected complete PHQ 2-9 Behavioral Management Strategies: Coping strategies, Counseling, Community resources Major Change/Loss/Stressor/Fears (CP): Medical condition, self Techniques to Cope with Loss/Stress/Change: Counseling        03/16/2024    9:56 AM  Depression screen PHQ 2/9  Decreased Interest 1  Down, Depressed, Hopeless 1  PHQ - 2 Score 2  Altered sleeping 1  Tired, decreased energy 2  Change in appetite 1  Feeling bad or failure about yourself  1  Trouble concentrating 1  Moving slowly or fidgety/restless 1  Suicidal thoughts 0  PHQ-9 Score 9  Difficult doing work/chores Somewhat difficult    Vitals:   BP within normal limits , per client  Medications Reviewed Today   Medications were not reviewed in this encounter     Recommendation:   PCP Follow-up Continue Current Plan of Care Take medications as prescribe Attend scheduled appointments with Behavioral Health counselor Call LCSW as needed for SW support  Follow Up Plan:   Telephone follow up appointment date/time:  04/14/24 at 3:30 PM    Glendia Pear  MSW, LCSW [place]/[facility] Licensed Clinical Social Worker Direct Dial:  [phone] Fax:  [phone] Website:  [email or web address]

## 2024-03-16 NOTE — Patient Instructions (Signed)
 Visit Information  Thank you for taking time to visit with me today. Please don't hesitate to contact me if I can be of assistance to you before our next scheduled appointment.  Our next appointment is by telephone on 04/14/24 at 3:30 PM   Please call the care guide team at [phone] if you need to cancel or reschedule your appointment.   Following is a copy of your care plan:   Goals Addressed             This Visit's Progress    VBCI Social Work Care Plan       Problems:   Transportation challenges (LCSW gave client the name and phone number of Carrie Case transport Carrie)             Back pain issues             Needs extra time to complete ADLs            Stress related to current illness of her daughter            Insomnia            Vision challenges (blurry vision due to cataracts. Cannot afford to have cataract procedure)  CSW Clinical Goal(s):   Over the next   30   days the Patient will attend all scheduled medical appointments as evidenced by patient report and care team review of appointment completion in electronic MEDICAL RECORD NUMBER .            Over next 30 days patient will communicate with representative for Carrie Case to discuss in home resources for vision needs of client AEB patient report of communication with Carrie Case representative   Interventions:  Spoke with Carrie Case about her status and needs             Carrie Case said her daughter has serious medical issues and Carrie Case is stressed related to current medical issues of her daughter. Carrie Case said that her daughter went to ED this morning for care            Discussed medication procurement of client            Discussed ambulation of client . She uses a walker to ambulate. Discussed client transport needs. LCSW encouraged Carrie Case to call Carrie Case insurance to make transport arrangements to and from her medical appointments.            Discussed pain issues of client; discussed sleeping issues of  client           Discussed vision needs of client. Discussed Carrie Case as a resource that may be able to help client with in home equipment to help her with vision needs. Carrie Case agreed for representative of Carrie Case to call her to talk about agency support            Completed PHQ 2/9; completed GAD-7.             Provided counseling support            Carrie Case said she has appointment today with Carrie Case in [place], [place] to discuss her counseling needs and substance use issues           She has appointment later this week with PCP            Encouraged client to call LCSW as needed for SW support.at [phone]  Patient Goals/Self-Care Activities:  Take medications as prescribed             Make needed transport arrangements for client to attend her upcoming medical appointments             Attend scheduled medical appointments            Allow time to complete ADLs             Allow time for rest and self care             Communicate as needed with representative of Carrie Case              Call LCSW as needed for SW support at [phone]  Plan:   LCSW to call client on 04/14/24 at 3;30 PM         Please call the Carrie Case: [phone] if you are experiencing a Mental Health or Behavioral Health Crisis or need someone to talk to.  The patient verbalized understanding of instructions, educational materials, and care plan provided today and DECLINED offer to receive copy of patient instructions, educational materials, and care plan.    Carrie Case  MSW, LCSW [place] Licensed Clinical Social Worker Direct Dial:  [phone] Fax:  [phone] Website:  [email or web address]

## 2024-03-17 ENCOUNTER — Other Ambulatory Visit (HOSPITAL_COMMUNITY): Payer: Self-pay

## 2024-03-17 ENCOUNTER — Other Ambulatory Visit: Payer: Self-pay

## 2024-03-18 ENCOUNTER — Inpatient Hospital Stay

## 2024-03-20 NOTE — Telephone Encounter (Signed)
 Pt advised with verbal understanding

## 2024-03-25 DIAGNOSIS — Z79899 Other long term (current) drug therapy: Secondary | ICD-10-CM | POA: Diagnosis not present

## 2024-03-27 ENCOUNTER — Inpatient Hospital Stay: Admitting: Family Medicine

## 2024-03-31 ENCOUNTER — Other Ambulatory Visit (HOSPITAL_COMMUNITY): Payer: Self-pay

## 2024-03-31 DIAGNOSIS — I2510 Atherosclerotic heart disease of native coronary artery without angina pectoris: Secondary | ICD-10-CM | POA: Diagnosis not present

## 2024-03-31 DIAGNOSIS — I4891 Unspecified atrial fibrillation: Secondary | ICD-10-CM | POA: Diagnosis not present

## 2024-03-31 DIAGNOSIS — I5032 Chronic diastolic (congestive) heart failure: Secondary | ICD-10-CM | POA: Diagnosis not present

## 2024-03-31 DIAGNOSIS — R5383 Other fatigue: Secondary | ICD-10-CM | POA: Diagnosis not present

## 2024-03-31 DIAGNOSIS — J449 Chronic obstructive pulmonary disease, unspecified: Secondary | ICD-10-CM | POA: Diagnosis not present

## 2024-03-31 DIAGNOSIS — I10 Essential (primary) hypertension: Secondary | ICD-10-CM | POA: Diagnosis not present

## 2024-03-31 DIAGNOSIS — Z515 Encounter for palliative care: Secondary | ICD-10-CM | POA: Diagnosis not present

## 2024-04-05 DIAGNOSIS — J9601 Acute respiratory failure with hypoxia: Secondary | ICD-10-CM | POA: Diagnosis not present

## 2024-04-05 NOTE — Progress Notes (Unsigned)
 Carrie Case, female    DOB: [birth year]  MRN: [id number]   Brief patient profile:  23  yowf  active smoker  referred to pulmonary clinic in [place]  11/01/2021 by Dr Michele for doe.  Proved to have GOLD 2 COPD criteria 02/2022    History of Present Illness  11/01/2021  Pulmonary/ 1st office eval/ Deriana Vanderhoef / [place] [facility]  Chief Complaint  Patient presents with   Consult    Referred by Dr. Danton for Chronic bronchitis and COPD   Dyspnea:  MMRC2 = can't walk a nl pace on a flat grade s sob but does fine slow and flat no better on spiriva  /some better on neb  Cough: none  Sleep: 3-4 x per week 30-45 degrees due to habit  SABA use: neb twice daily on avg - typically p exertion  Rec Plan A = Automatic = Always=    Spiriva  one capsule each am  Plan B = Backup (to supplement plan A, not to replace it) Only use your albuterol  inhaler  Plan C = Crisis (instead of Plan B but only if Plan B stops working) - only use your albuterol  nebulizer if you first try Plan B      10/14/2023  f/u ov/[name] office/[place] Brissette re: GOLD 2 COPD  maint on stiolto   Chief Complaint  Patient presents with   Follow-up    Follow up copd   Dyspnea:  food lion walking fine  Cough: am congestion clear mucus  Sleeping: 45 degrees recliner since husband passed  resp cc  SABA use: none  02: none  LCS > refused again  Rec 1) stop pantoprazole  2) increase pepcid  to 20 mg one twice daily after meals x one week (after breakfast and supper)  3) after one week if no cough and chest pain or obvious heart brun reduce pepcid  to 20 mg after supper x one more week and  then stop If worse cough or acid symptoms go back one step and try the next step after 2 weeks with 2 weeks between each step down.   The key is to stop smoking completely before smoking completely stops you! Please schedule a follow up visit in 12  months but call sooner if needed          04/08/2024  f/u ov/[place] office/[place] re: GOLD 2 COPD    maint on stiolto  /Amiodarone  / still smoking  Chief Complaint  Patient presents with   COPD    F/u no trouble   Dyspnea:  riding scooter due to leg problems was using walker but not now  Cough: min am congestion mucoid Sleeping: flat surface with  2 pillows  s noct    resp cc  SABA use: uses p exert 02: none > 02 fine with exertion = 92%   Lung cancer screening: still declined    No obvious day to day or daytime variability or assoc  purulent sputum or mucus plugs or hemoptysis or cp or chest tightness, subjective wheeze or overt sinus or hb symptoms.    Also denies any obvious fluctuation of symptoms with weather or environmental changes or other aggravating or alleviating factors except as outlined above   No unusual exposure hx or h/o childhood pna/ asthma or knowledge of premature birth.  Current Allergies, Complete Past Medical History, Past Surgical History, Family History, and Social History were reviewed in [employer] record.  ROS  The following are not active complaints unless bolded Hoarseness,  sore throat, dysphagia, dental problems, itching, sneezing,  nasal congestion or discharge of excess mucus or purulent secretions, ear ache,   fever, chills, sweats, unintended wt loss or wt gain, classically pleuritic or exertional cp,  orthopnea pnd or arm/hand swelling  or leg swelling, presyncope, palpitations, abdominal pain, anorexia, nausea, vomiting, diarrhea  or change in bowel habits or change in bladder habits, change in stools or change in urine, dysuria, hematuria,  rash, arthralgias, visual complaints, headache, numbness, weakness or ataxia or problems with walking or coordination,  change in mood or  memory.        Current Meds - list is not complete   Medication Sig   albuterol  (VENTOLIN  HFA) 108 (90 Base) MCG/ACT inhaler Inhale 2 puffs into the lungs every 6 (six) hours as needed for wheezing or shortness of breath.   Tiotropium  Bromide-Olodaterol (STIOLTO RESPIMAT ) 2.5-2.5 MCG/ACT AERS Inhale 2 puffs into the lungs in the morning.             Past Medical History:  Diagnosis Date   Acute respiratory failure with hypoxia (HCC) 12/22/2018   Tobacco dependence        Objective:    Wts   04/08/2024       124  10/14/2023       121   04/08/2023       130  03/04/2023       127  12/11/2022       131   04/20/2022         131   02/14/22 132 lb 6.4 oz (60.1 kg)  11/01/21 137 lb (62.1 kg)  08/29/21 139 lb (63 kg)    Vital signs reviewed  04/08/2024  - Note at rest 02 sats  94% on RA   General appearance:    somber wf nad / congested rattling cough     HEENT : Oropharynx  clear/ edentulous  Nasal turbinates nl    NECK :  without  apparent JVD/ palpable Nodes/TM    LUNGS: no acc muscle use,  Mild barrel  contour chest wall with LUL rhonchi better p coughed e and  without cough on insp or exp maneuvers  and mild  Hyperresonant  to  percussion bilaterally     CV:  RRR  no s3 or murmur or increase in P2, and no edema   ABD:  soft and nontender with pos end  insp Hoover's  in the supine position.  No bruits or organomegaly appreciated   MS:  Nl gait/ ext warm without deformities Or obvious joint restrictions  calf tenderness, cyanosis or clubbing    SKIN: warm and dry without lesions    NEURO:  alert, approp, nl sensorium with  no motor or cerebellar deficits apparent.      CXR PA and Lateral:   04/08/2024 :    I personally reviewed images and impression is as follows:     C/w mild t kyphosis/ mild copd  / no L hilar or LUL findings          Assessment     Assessment & Plan COPD  GOLD 2 / still smoking  Active smoking  - 11/01/2021  After extensive coaching inhaler device,  effectiveness =  75%> continue spiriva  dpi and prn saba  - 11/01/2021   Walked on RA  x  3  lap(s) =  approx 450  ft  @ mod pace, stopped due to end of study, sob on 3rd lap with lowest  02 sats 94%  - PFT's  03/08/22  FEV1 1.62 (61  % ) ratio 0.69  p 5 % improvement from saba p  spiriva  prior to study with DLCO  8.80 (41%)   and FV curve mild concavity   - 04/20/2022  After extensive coaching inhaler device,  effectiveness = 75%    SMI > try spiriva  smi sample then rx with stiolto trial > f/u q 3 m   - 12/11/2022   Walked on RA  x  2  lap(s) =  approx 300  ft  @ slow pace, stopped due to sob  with lowest 02 sats 92% s cp /chest tightness   - 03/04/2023  After extensive coaching inhaler device,  effectiveness =  75% from a baseline of 25% (short Ti) with smi > continue stiolto and off fosamax  (chest tightness) and max gerd rx then regroup in 6 weeks    - 03/04/2023   Walked on RA  x  1  lap(s) =  approx 150  ft  @ mod pace, stopped due to tired/chest tight but no worse than at rest)  with lowest 02 sats 95% but no sob  - 04/08/2023   Walked on RA  x  2  lap(s) =  approx 300  ft  @ nl  pace, stopped due to fatigue s chest tight or sob  with lowest 02 sats 91%        Pt is Group B in terms of symptom/risk and laba/lama therefore appropriate rx at this point >>>  continue stiolto and more approp saba than historically using: Re SABA :  I spent extra time with pt today reviewing appropriate use of albuterol  for prn use on exertion with the following points: 1) saba is for relief of sob that does not improve by walking a slower pace or resting but rather if the pt does not improve after trying this first. 2) If the pt is convinced, as many are, that saba helps recover from activity faster then it's easy to tell if this is the case by re-challenging : ie stop, take the inhaler, then p 5 minutes try the exact same activity (intensity of workload) that just caused the symptoms and see if they are substantially diminished or not after saba 3) if there is an activity that reproducibly causes the symptoms, try the saba 15 min before the activity on alternate days   If in fact the saba really does help, then fine to continue to use it prn but  advised may need to look closer at the maintenance regimen being used to achieve better control of airways disease with exertion.     Cigarette smoker Counseled re importance of smoking cessation but did not meet time criteria for separate billing    Chronic respiratory failure with hypoxia (HCC) Amiodarone  started 07/12/23  04/08/2024  still on amiodarone   Walked on RA  x  3  lap(s) =  approx 450  ft  @ moe pace, stopped due to end of study  with lowest 02 sats 93% no sob    Advised: Make sure you check your oxygen saturation at your highest level of activity(NOT after you stop)  to be sure it stays over 90% and keep track of it at least once a week, more often if breathing getting worse, and let me know if losing ground. (Collect the dots to connect the dots approach to connect early signs of amio toxicity)    Each maintenance medication  was reviewed in detail including emphasizing most importantly the difference between maintenance and prns and under what circumstances the prns are to be triggered using an action plan format where appropriate.  Total time for H and P, chart review, counseling, reviewing smi/hfa/02 sat device(s) , directly observing portions of ambulatory 02 saturation study/ and generating customized AVS unique to this office visit / same day charting = 35 min                  Patient Instructions  Make sure you check your oxygen saturation at your highest level of activity(NOT after you stop)  to be sure it stays over 90% and keep track of it at least once a week, more often if breathing getting worse, and let me know if losing ground. (Collect the dots to connect the dots approach)     We can cancel your 02 as of today   Please remember to go to the  x-ray department  @  [facility] for your tests - we will call you with the results when they are available     The key is to stop smoking completely before smoking completely stops you!      Please schedule  a follow up visit in 6 months but call sooner if needed            Ozell America, MD 04/08/2024

## 2024-04-06 ENCOUNTER — Other Ambulatory Visit (HOSPITAL_COMMUNITY): Payer: Self-pay

## 2024-04-06 ENCOUNTER — Other Ambulatory Visit: Payer: Self-pay

## 2024-04-07 ENCOUNTER — Other Ambulatory Visit: Payer: Self-pay

## 2024-04-07 ENCOUNTER — Other Ambulatory Visit (HOSPITAL_COMMUNITY): Payer: Self-pay

## 2024-04-08 ENCOUNTER — Encounter: Payer: Self-pay | Admitting: Internal Medicine

## 2024-04-08 ENCOUNTER — Ambulatory Visit (INDEPENDENT_AMBULATORY_CARE_PROVIDER_SITE_OTHER): Admitting: Internal Medicine

## 2024-04-08 ENCOUNTER — Ambulatory Visit (HOSPITAL_COMMUNITY)
Admission: RE | Admit: 2024-04-08 | Discharge: 2024-04-08 | Disposition: A | Discharge status: Home / Self Care | Source: Ambulatory Visit | Attending: Internal Medicine | Admitting: Internal Medicine

## 2024-04-08 VITALS — BP 158/76 | HR 62 | Ht 66.0 in | Wt 124.2 lb

## 2024-04-08 DIAGNOSIS — F17210 Nicotine dependence, cigarettes, uncomplicated: Secondary | ICD-10-CM | POA: Diagnosis not present

## 2024-04-08 DIAGNOSIS — F17200 Nicotine dependence, unspecified, uncomplicated: Secondary | ICD-10-CM | POA: Diagnosis not present

## 2024-04-08 DIAGNOSIS — J9611 Chronic respiratory failure with hypoxia: Secondary | ICD-10-CM | POA: Diagnosis not present

## 2024-04-08 DIAGNOSIS — J449 Chronic obstructive pulmonary disease, unspecified: Secondary | ICD-10-CM | POA: Insufficient documentation

## 2024-04-08 DIAGNOSIS — I4891 Unspecified atrial fibrillation: Secondary | ICD-10-CM | POA: Diagnosis not present

## 2024-04-08 DIAGNOSIS — R059 Cough, unspecified: Secondary | ICD-10-CM | POA: Diagnosis not present

## 2024-04-08 NOTE — Assessment & Plan Note (Addendum)
 Counseled re importance of smoking cessation but did not meet time criteria for separate billing

## 2024-04-08 NOTE — Assessment & Plan Note (Addendum)
 Active smoking  - 11/01/2021  After extensive coaching inhaler device,  effectiveness =  75%> continue spiriva  dpi and prn saba  - 11/01/2021   Walked on RA  x  3  lap(s) =  approx 450  ft  @ mod pace, stopped due to end of study, sob on 3rd lap with lowest 02 sats 94%  - PFT's  03/08/22  FEV1 1.62 (61 % ) ratio 0.69  p 5 % improvement from saba p  spiriva  prior to study with DLCO  8.80 (41%)   and FV curve mild concavity   - 04/20/2022  After extensive coaching inhaler device,  effectiveness = 75%    SMI > try spiriva  smi sample then rx with stiolto trial > f/u q 3 m   - 12/11/2022   Walked on RA  x  2  lap(s) =  approx 300  ft  @ slow pace, stopped due to sob  with lowest 02 sats 92% s cp /chest tightness   - 03/04/2023  After extensive coaching inhaler device,  effectiveness =  75% from a baseline of 25% (short Ti) with smi > continue stiolto and off fosamax  (chest tightness) and max gerd rx then regroup in 6 weeks    - 03/04/2023   Walked on RA  x  1  lap(s) =  approx 150  ft  @ mod pace, stopped due to tired/chest tight but no worse than at rest)  with lowest 02 sats 95% but no sob  - 04/08/2023   Walked on RA  x  2  lap(s) =  approx 300  ft  @ nl  pace, stopped due to fatigue s chest tight or sob  with lowest 02 sats 91%        Pt is Group B in terms of symptom/risk and laba/lama therefore appropriate rx at this point >>>  continue stiolto and more approp saba than historically using: Re SABA :  I spent extra time with pt today reviewing appropriate use of albuterol  for prn use on exertion with the following points: 1) saba is for relief of sob that does not improve by walking a slower pace or resting but rather if the pt does not improve after trying this first. 2) If the pt is convinced, as many are, that saba helps recover from activity faster then it's easy to tell if this is the case by re-challenging : ie stop, take the inhaler, then p 5 minutes try the exact same activity (intensity of workload)  that just caused the symptoms and see if they are substantially diminished or not after saba 3) if there is an activity that reproducibly causes the symptoms, try the saba 15 min before the activity on alternate days   If in fact the saba really does help, then fine to continue to use it prn but advised may need to look closer at the maintenance regimen being used to achieve better control of airways disease with exertion.

## 2024-04-08 NOTE — Assessment & Plan Note (Addendum)
 Amiodarone  started 07/12/23  04/08/2024  still on amiodarone   Walked on RA  x  3  lap(s) =  approx 450  ft  @ moe pace, stopped due to end of study  with lowest 02 sats 93% no sob    Advised: Make sure you check your oxygen saturation at your highest level of activity(NOT after you stop)  to be sure it stays over 90% and keep track of it at least once a week, more often if breathing getting worse, and let me know if losing ground. (Collect the dots to connect the dots approach to connect early signs of amio toxicity)    Each maintenance medication was reviewed in detail including emphasizing most importantly the difference between maintenance and prns and under what circumstances the prns are to be triggered using an action plan format where appropriate.  Total time for H and P, chart review, counseling, reviewing smi/hfa/02 sat device(s) , directly observing portions of ambulatory 02 saturation study/ and generating customized AVS unique to this office visit / same day charting = 35 min

## 2024-04-08 NOTE — Patient Instructions (Addendum)
 Make sure you check your oxygen saturation at your highest level of activity(NOT after you stop)  to be sure it stays over 90% and keep track of it at least once a week, more often if breathing getting worse, and let me know if losing ground. (Collect the dots to connect the dots approach)     We can cancel your 02 as of today   Please remember to go to the  x-ray department  @  [facility] for your tests - we will call you with the results when they are available     The key is to stop smoking completely before smoking completely stops you!      Please schedule a follow up visit in 6 months but call sooner if needed

## 2024-04-09 DIAGNOSIS — Z79899 Other long term (current) drug therapy: Secondary | ICD-10-CM | POA: Diagnosis not present

## 2024-04-11 ENCOUNTER — Other Ambulatory Visit (HOSPITAL_COMMUNITY): Payer: Self-pay

## 2024-04-14 ENCOUNTER — Ambulatory Visit: Payer: Self-pay | Admitting: Internal Medicine

## 2024-04-14 ENCOUNTER — Other Ambulatory Visit: Payer: Self-pay | Admitting: Licensed Clinical Social Worker

## 2024-04-14 NOTE — Patient Outreach (Signed)
 Complex Care Management   Visit Note  04/14/2024  Name:  Carrie Case MRN: [id number] DOB: 30-Oct-1955  Situation: Referral received for Complex Care Management related to [facility] needs; grief issues  I obtained verbal consent from Patient.  Visit completed with Patient  on the phone  Background:   Past Medical History:  Diagnosis Date   A-fib [facility])    Acute respiratory failure with hypoxia (HCC) 12/22/2018   Carotid artery stenosis    CHF (congestive heart failure) (HCC)    COPD (chronic obstructive pulmonary disease) (HCC)    History of home oxygen therapy    HLD (hyperlipidemia)    Memory changes    Osteoporosis    PAD (peripheral artery disease) (HCC)    Recurrent falls    Tobacco dependence     Assessment: Patient Reported Symptoms:  Cognitive Cognitive Status: Alert and oriented to person, place, and time, Difficulties with attention and concentration Cognitive/Intellectual Conditions Management [RPT]: None reported or documented in medical history or problem list   Health Maintenance Behaviors: Sleep adequate, Stress management Health Facilitated by: Stress management  Neurological Neurological Review of Symptoms: Vision changes Neurological Management Strategies: Coping strategies  HEENT HEENT Symptoms Reported: No symptoms reported HEENT Management Strategies: Coping strategies    Cardiovascular Cardiovascular Symptoms Reported: Fatigue Does patient have uncontrolled Hypertension?: No Cardiovascular Management Strategies: Coping strategies  Respiratory Respiratory Symptoms Reported: Shortness of breath Respiratory Management Strategies: Adequate rest, Coping strategies  Endocrine Endocrine Symptoms Reported: Weakness or fatigue, Shortness of breath, Blurry vision, Shakiness Is patient diabetic?: No    Gastrointestinal Gastrointestinal Symptoms Reported: No symptoms reported Gastrointestinal Management Strategies: Adequate rest    Genitourinary Genitourinary  Symptoms Reported: No symptoms reported Genitourinary Management Strategies: Adequate rest, Coping strategies  Integumentary Integumentary Symptoms Reported: Other (client takes blood thinner. She said she has red blotches on parts of her skin (arms, legs)) Skin Management Strategies: Coping strategies  Musculoskeletal Musculoskelatal Symptoms Reviewed: Unsteady gait, Back pain Musculoskeletal Management Strategies: Coping strategies      Psychosocial Psychosocial Symptoms Reported: Anxiety - if selected complete GAD, Sadness - if selected complete PHQ 2-9, Depression - if selected complete PHQ 2-9 Additional Psychological Details: Client's daughter recently passed away . Venora is adjusting to death of her daughter Behavioral Management Strategies: Coping strategies Major Change/Loss/Stressor/Fears (CP): Medical condition, self Techniques to Cope with Loss/Stress/Change: Counseling Quality of Family Relationships: supportive Do you feel physically threatened by others?: No    04/14/2024    PHQ2-9 Depression Screening   Little interest or pleasure in doing things Several days  Feeling down, depressed, or hopeless Several days  PHQ-2 - Total Score 2  Trouble falling or staying asleep, or sleeping too much Several days  Feeling tired or having little energy Several days  Poor appetite or overeating  Several days  Feeling bad about yourself - or that you are a failure or have let yourself or your family down Several days  Trouble concentrating on things, such as reading the newspaper or watching television Several days  Moving or speaking so slowly that other people could have noticed.  Or the opposite - being so fidgety or restless that you have been moving around a lot more than usual Several days  Thoughts that you would be better off dead, or hurting yourself in some way Not at all  PHQ2-9 Total Score 8  If you checked off any problems, how difficult have these problems made it for you  to do your work, take care of  things at home, or get along with other people Somewhat difficult  Depression Interventions/Treatment Medication, Counseling    Vitals:  BP within normal range, per client  Medications Reviewed Today     Reviewed by Frances Ozell GORMAN KEN (Social Worker) on 04/14/24 at [id number]  Med List Status: <None>   Medication Order Taking? Sig Documenting Provider Last Dose Status Informant  albuterol  (VENTOLIN  HFA) 108 (90 Base) MCG/ACT inhaler [id number] Yes Inhale 2 puffs into the lungs every 6 (six) hours as needed for wheezing or shortness of breath. Melvenia Manus BRAVO, MD  Active   albuterol  (VENTOLIN  HFA) 108 [phone] Base) MCG/ACT inhaler [id number] Yes Inhale 2 puffs every four (4) hours as needed for wheezing.   Active   amiodarone  (PACERONE ) 200 MG tablet [id number] Yes Take 1 tablet (200 mg total) by mouth daily. Miriam Norris, NP  Active   amiodarone  (PACERONE ) 200 MG tablet [id number] Yes Take 1 tablet (200 mg total) by mouth two (2) times a day.   Active   Digoxin  (LANOXIN ) 62.5 MCG TABS [id number] Yes Take 1 tablet by mouth daily. Miriam Norris, NP  Active   Digoxin  62.5 MCG TABS [id number] Yes Take 62.5 mcg by mouth daily. Miriam Norris, NP  Active   furosemide  (LASIX ) 20 MG tablet [id number] Not taking  Take 1 tablet (20 mg total) by mouth as needed. For increased swelling, weight gain of 3 LB in 1 day or 5 LB in 1 week  Patient not taking: Reported on 04/14/2024   Fairy Frames, MD  Active   hydrOXYzine  (ATARAX ) 25 MG tablet [id number] Yes Take 1 tablet (25 mg total) by mouth every six (6) hours as needed for anxiety or itching.   Active   Melatonin 10 MG TABS [id number] Yes Take 1 tablet (10 mg total) by mouth in the morning.   Active   metoprolol  succinate (TOPROL -XL) 25 MG 24 hr tablet [id number] Yes Take 1/2 tablet (12.5 mg total) by mouth daily.   Active   rivaroxaban  (XARELTO ) 20 MG TABS tablet [id number] Yes Take 1 tablet (20 mg total) by mouth daily with  supper. Miriam Norris, NP  Active   rivaroxaban  (XARELTO ) 20 MG TABS tablet [id number] unknown Take 1 tablet (20 mg total) by mouth daily with evening meal.   Active   rosuvastatin  (CRESTOR ) 20 MG tablet [id number] Yes Take 1 tablet (20 mg total) by mouth daily. Darron Deatrice LABOR, MD  Active   Tiotropium Bromide -Olodaterol (STIOLTO RESPIMAT ) 2.5-2.5 MCG/ACT AERS [id number] Yes Inhale 2 puffs into the lungs in the morning. Darlean Ozell NOVAK, MD  Active             Recommendation:   PCP Follow-up Continue Current Plan of Care Take medications as prescribed Call LCSW as needed for SW support Talk with family members regularly to discuss grief issues faced over recent death of client's daughter.  Follow Up Plan:   Telephone follow up appointment date/time:  05/25/2024 at 11:00 AM    Glendia Frances  MSW, LCSW [place]/Value Based Care [facility] Licensed Clinical Social Worker Direct Dial:  [phone] Fax:  [phone] Website:  [email or web address]

## 2024-04-14 NOTE — Patient Instructions (Signed)
 Visit Information  Thank you for taking time to visit with me today. Please don't hesitate to contact me if I can be of assistance to you before our next scheduled appointment.  Our next appointment is by telephone on 05/25/2024 at 11:00 AM   Please call the care guide team at [phone] if you need to cancel or reschedule your appointment.   Following is a copy of your care plan:   Goals Addressed             This Visit's Progress    VBCI Social Work Care Plan       Problems:   Transportation challenges (LCSW gave client the name and phone number of [facility] transport services)             Back pain issues             Needs extra time to complete ADLs            Stress related to recent death of her daughter who had been ill for some time            Vision challenges (blurry vision due to cataracts. Cannot afford to have cataract procedure)  CSW Clinical Goal(s):   Over the next   30   days the Patient will attend all scheduled medical appointments as evidenced by patient report and care team review of appointment completion in electronic MEDICAL RECORD NUMBER .            Over next 30 days patient will use stress reduction activities of choice to help her manage grief issues experienced AEB patient report of decrease in grief symptoms experienced  Interventions:  Spoke with Randal about her status and needs             Christella said her daughter passed away recently and that this had been very difficult for [place]. She said she has some support from family members. Numerous family members live in [place] , [place]  area.  She speaks with family members via phone.            Discussed counseling support. She said she was not talking with counselor at present. Encouraged [name] to consider counseling support related to managing grief issues faced. She did not seem interested in counseling support at present.  LCSW offered to talk with her more about counseling needs.             Discussed  medication procurement of client            Discussed ambulation of client . She is walking well, she is not using a walker at present.             Discussed client transport needs. LCSW encouraged Desree to call [facility] insurance to make transport arrangements to and from her medical appointments. She is using [facility] insurance to help with transport needs faced           Discussed pain issues of client; discussed sleeping issues of client. She said she is sleeping better           Discussed vision needs of client. Discussed [facility] [employer] as a resource that may be able to help client with in home equipment to help her with vision needs. [name] agreed for representative of [employer] to call her to talk about agency support            Provided counseling support  Blessed said she goes to same PCP office but will have new PCP at same practice she has been receiving care from recently. Her previous PCP went to new medical practice         Encouraged client to call LCSW as needed for SW support.at [phone]  Patient Goals/Self-Care Activities:  Take medications as prescribed             Make needed transport arrangements for client to attend her upcoming medical appointments             Attend scheduled medical appointments            Allow time to complete ADLs             Allow time for rest and self care             Communicate as needed with representative of [facility]              Call LCSW as needed for SW support at [phone]  Plan:   LCSW to call client on 05/25/2024 at 11:00 AM         Please call the [facility]: [phone] if you are experiencing a Mental Health or Behavioral Health Crisis or need someone to talk to.  The patient verbalized understanding of instructions, educational materials, and care plan provided today and DECLINED offer to receive copy of patient instructions, educational materials, and care plan.     Glendia Pear  MSW, LCSW [place]/[facility] Licensed Clinical Social Worker Direct Dial:  [phone] Fax:  [phone] Website:  [email or web address]

## 2024-04-16 ENCOUNTER — Encounter: Payer: Self-pay | Admitting: Nurse Practitioner

## 2024-04-16 ENCOUNTER — Ambulatory Visit: Attending: Nurse Practitioner | Admitting: Nurse Practitioner

## 2024-04-16 VITALS — BP 124/72 | HR 64 | Ht 66.0 in | Wt 124.0 lb

## 2024-04-16 DIAGNOSIS — I5032 Chronic diastolic (congestive) heart failure: Secondary | ICD-10-CM | POA: Diagnosis not present

## 2024-04-16 DIAGNOSIS — J449 Chronic obstructive pulmonary disease, unspecified: Secondary | ICD-10-CM | POA: Diagnosis not present

## 2024-04-16 DIAGNOSIS — I771 Stricture of artery: Secondary | ICD-10-CM

## 2024-04-16 DIAGNOSIS — I480 Paroxysmal atrial fibrillation: Secondary | ICD-10-CM | POA: Diagnosis not present

## 2024-04-16 DIAGNOSIS — Z7901 Long term (current) use of anticoagulants: Secondary | ICD-10-CM

## 2024-04-16 DIAGNOSIS — E785 Hyperlipidemia, unspecified: Secondary | ICD-10-CM | POA: Diagnosis not present

## 2024-04-16 DIAGNOSIS — Z79899 Other long term (current) drug therapy: Secondary | ICD-10-CM

## 2024-04-16 DIAGNOSIS — I6523 Occlusion and stenosis of bilateral carotid arteries: Secondary | ICD-10-CM | POA: Diagnosis not present

## 2024-04-16 NOTE — Progress Notes (Signed)
 Cardiology Office Note:  .   Date:  04/16/2024 ID:  Carrie Case, DOB 30-Jun-1956, MRN [id number] PCP: Freddrick Johns  [facility] Providers Cardiologist:  Alvan Carrier, MD PV Cardiologist:  Deatrice Cage, MD    History of Present Illness: .   Carrie Case is a 68 y.o. female with a PMH of chronic HFpEF, A-fib, PAD, HLD, carotid artery stenosis, tobacco use, and COPD, who presents today for scheduled follow-up.   Presented to [facility] on June 21, 2023 for A-fib with RVR, CHF, pneumonia, UTI, she was transferred to [facility] for further care.  BNP 746.  AST 109, ALT 52.  She was placed on amiodarone  drip and received Lasix .  Underwent DCCV on June 24, 2023.  The following day she went back to A-fib with RVR.  Also received 2 doses of digoxin , was restarted on Toprol  and also on Cardizem  CD3 100 mg daily at baseline.  Updated echo revealed EF 55 to 60%, mild to moderate AR, mild LVH and preserved RV function.  It was felt that CHF exacerbation was likely related to A-fib with RVR.  Was treated with antibiotics for CAP.  Last telephone visit on 01/23/2024. Fatigue remained the same, Digoxin  was d/c.   Hospitalized July 2025 due to acute respiratory failure with hypoxia and COPD exacerbation.  Hospital course also noted by A-fib with RVR.  Heart rate in 150s on arrival.  Was found to be hypoxic on arrival with oxygen saturation in the 70s.  It was noted that on admission patient admitted to use of Xanax  2 mg 3 times daily at home that she was not prescribed.  Daughter stated that patient has started snorting large amounts of both opioids and benzos at home daily.  During hospitalization, she required a sitter and restraints.  UDS was positive for benzos only.  CTA was negative for PE, bronchiolitis picture.  Qualified for 2 L on oxygen at home.  A-fib with RVR was difficult to control during patient's withdrawals.  Discharged on amiodarone , digoxin , and metoprolol  as well  as Xarelto .  Did convert back to normal sinus rhythm.  Today she presents for hospital follow-up. She is now following local opioid clinic for assistance from substance use disorder. Sadly, tells me her daughter passed away since last telephone visit. Overall doing well. Denies any acute cardiac complaints or issues. Denies any chest pain, shortness of breath, palpitations, syncope, presyncope, dizziness, orthopnea, PND, swelling or significant weight changes, acute bleeding, or claudication.   ROS: Negative. Seen HPI.   Studies Reviewed: SABRA    EKG: EKG is not ordered today.  EKG Interpretation Date/Time:  Thursday April 16 2024 14:33:06 EDT Ventricular Rate:  55 PR Interval:  124 QRS Duration:  92 QT Interval:  442 QTC Calculation: 422 R Axis:   95  Text Interpretation: Sinus bradycardia Rightward axis When compared with ECG of 31-Jan-2024 13:26, No significant change was found Confirmed by Miriam Norris [phone]) on 04/16/2024 2:34:19 PM   Carotid duplex 01/2024:  Summary:  Right Carotid: Velocities in the right ICA are consistent with a 1-39%  stenosis.                Non-hemodynamically significant plaque <50% noted in the  CCA. The                 ECA appears <50% stenosed.   Left Carotid: Velocities in the left ICA are consistent with a 40-59%  stenosis.  Non-hemodynamically significant plaque <50% noted in the  CCA. The                ECA appears <50% stenosed.   Vertebrals:  Right vertebral artery demonstrates antegrade flow. Left  vertebral              artery demonstrates bidirectional flow.  Subclavians: Right subclavian artery flow was disturbed. Left subclavian  flow              was biphasic.   *See table(s) above for measurements and observations.  Suggest follow up study in 12 months.   Echo 06/2023: 1. Left ventricular ejection fraction, by estimation, is 55 to 60%. The  left ventricle has normal function. The left ventricle has no regional   wall motion abnormalities. There is mild left ventricular hypertrophy.  Left ventricular diastolic function could not be evaluated.   2. Right ventricular systolic function is normal. The right ventricular  size is normal. There is moderately elevated pulmonary artery systolic  pressure.   3. Left atrial size was moderately dilated.   4. Right atrial size was moderately dilated.   5. The mitral valve is normal in structure. Trivial mitral valve  regurgitation. No evidence of mitral stenosis. Moderate mitral annular  calcification.   6. The aortic valve is tricuspid. Aortic valve regurgitation is mild to  moderate. Aortic valve sclerosis/calcification is present, without any  evidence of aortic stenosis.   7. The inferior vena cava is normal in size with greater than 50%  respiratory variability, suggesting right atrial pressure of 3 mmHg.  Echo 10/2022:  1. Left ventricular ejection fraction, by estimation, is 55 to 60%. The  left ventricle has normal function. The left ventricle has no regional  wall motion abnormalities. There is mild concentric left ventricular  hypertrophy. Left ventricular diastolic  parameters are indeterminate. The average left ventricular global  longitudinal strain is -19.6 %. The global longitudinal strain is normal.   2. Right ventricular systolic function is low normal. The right  ventricular size is normal. There is normal pulmonary artery systolic  pressure. The estimated right ventricular systolic pressure is 35.7 mmHg.   3. Left atrial size was severely dilated.   4. Right atrial size was moderately dilated.   5. The mitral valve is degenerative. Mild mitral valve regurgitation.  Moderate mitral annular calcification.   6. The aortic valve is tricuspid. There is mild calcification of the  aortic valve. Aortic valve regurgitation is mild. Aortic valve sclerosis  is present, with no evidence of aortic valve stenosis. Aortic  regurgitation PHT measures  560 msec.   7. The inferior vena cava is normal in size with greater than 50%  respiratory variability, suggesting right atrial pressure of 3 mmHg.   Comparison(s): Prior images reviewed side by side. LVEF remains normal range at 55-60%.   Physical Exam:   VS:  BP 124/72   Pulse 64   Ht 5' 6 (1.676 m)   Wt 124 lb (56.2 kg)   SpO2 96%   BMI 20.01 kg/m    Wt Readings from Last 3 Encounters:  04/16/24 124 lb (56.2 kg)  04/08/24 124 lb 3.2 oz (56.3 kg)  01/23/24 117 lb (53.1 kg)    GEN: Well nourished, well developed in no acute distress, appears emotionally depressed NECK: No JVD; bilateral carotid bruits noted CARDIAC: S1/S2, RRR, no murmurs, rubs, gallops RESPIRATORY:  Clear to auscultation without rales, wheezing or rhonchi  ABDOMEN: Soft, non-tender, non-distended EXTREMITIES:  No edema; No deformity   ASSESSMENT AND PLAN: .    HFpEF Stage C, NYHA class I-II symptoms. EF 55-60% 06/2023. Euvolemic and well compensated on exam. Continue current medication regimen. Low sodium diet, fluid restriction <2L, and daily weights encouraged. Educated to contact our office for weight gain of 2 lbs overnight or 5 lbs in one week. Will obtain CBC, CMET.   PAF, medication management Denies any palpitations or tachycardia.  EKG reveals SB, doing well. Continue current medication regimen and continue Xarelto  20 mg daily.  Continue Xarelto  for stroke prevention.  Will obtain thyroid  panel, Digoxin  level and CMET.  She denies any bleeding issues and is on appropriate dosage of Xarelto .  Heart healthy diet and regular cardiovascular exercise encouraged. Care and ED precautions discussed.   HLD, carotid artery stenosis Denies any  symptoms.  See most recent carotid duplex results noted above. Denies any symptoms.  LDL 24 06/2023.  Continue rosuvastatin .  Continue to follow-up with Dr. Darron.   COPD Denies any recent symptoms.  Continue current medication regimen.  Continue follow-up with Dr.  Darlean.    Medication Adjustments/Labs and Tests Ordered: Current medicines are reviewed at length with the patient today.  Concerns regarding medicines are outlined above.   Tests Ordered: Orders Placed This Encounter  Procedures   CBC   Comprehensive Metabolic Panel (CMET)   Digoxin  level   Thyroid  Panel With TSH   EKG 12-Lead    Medication Changes: No orders of the defined types were placed in this encounter.   I spent a total duration of 30 minutes reviewing prior notes, reviewing outside records including  labs, EKG today, face-to-face counseling of medical condition, pathophysiology, evaluation, management, and documenting the findings in the note.   Follow Up:  In Person in 6 month(s)  Signed, Almarie Crate, NP

## 2024-04-16 NOTE — Patient Instructions (Signed)
 Medication Instructions:  Your physician recommends that you continue on your current medications as directed. Please refer to the Current Medication list given to you today.  Labwork: In 1-2 weeks at [facility]   Testing/Procedures: None   Follow-Up: Your physician recommends that you schedule a follow-up appointment in: 6 months   Any Other Special Instructions Will Be Listed Below (If Applicable).  If you need a refill on your cardiac medications before your next appointment, please call your pharmacy.

## 2024-04-21 ENCOUNTER — Other Ambulatory Visit (HOSPITAL_COMMUNITY): Payer: Self-pay

## 2024-04-23 ENCOUNTER — Other Ambulatory Visit: Payer: Self-pay

## 2024-04-23 DIAGNOSIS — Z79899 Other long term (current) drug therapy: Secondary | ICD-10-CM | POA: Diagnosis not present

## 2024-04-23 NOTE — Progress Notes (Signed)
 Patient has not received initial dose sent 07/2023. Dis-enrolling.

## 2024-04-24 ENCOUNTER — Telehealth: Payer: Self-pay | Admitting: Cardiology

## 2024-04-24 NOTE — Telephone Encounter (Signed)
 Patient needed her regular inhaler added back to her list. I advised her to call Dr.Wert's office for a refill of this medication if needed

## 2024-04-24 NOTE — Telephone Encounter (Signed)
 Pt c/o medication issue:  1. Name of Medication:   Tiotropium Bromide -Olodaterol (STIOLTO RESPIMAT ) 2.5-2.5 MCG/ACT AERS   2. How are you currently taking this medication (dosage and times per day)?   2 puff daily in the morning  3. Are you having a reaction (difficulty breathing--STAT)?   4. What is your medication issue?    Patient stated she was taken off this medication and wants to be put back on this medication.

## 2024-04-27 ENCOUNTER — Other Ambulatory Visit: Payer: Self-pay

## 2024-04-29 ENCOUNTER — Other Ambulatory Visit (HOSPITAL_COMMUNITY): Payer: Self-pay

## 2024-04-29 ENCOUNTER — Other Ambulatory Visit: Payer: Self-pay

## 2024-04-29 ENCOUNTER — Telehealth: Payer: Self-pay | Admitting: Cardiology

## 2024-04-29 MED ORDER — DIGOXIN 62.5 MCG PO TABS
62.5000 ug | ORAL_TABLET | Freq: Every day | ORAL | 3 refills | Status: AC
  Filled 2024-06-28: qty 30, 30d supply, fill #0
  Filled 2024-07-28: qty 30, 30d supply, fill #1
  Filled 2024-08-27: qty 30, 30d supply, fill #2

## 2024-04-29 MED ORDER — METOPROLOL SUCCINATE ER 25 MG PO TB24
12.5000 mg | ORAL_TABLET | Freq: Every day | ORAL | 3 refills | Status: AC
  Filled 2024-04-29 – 2024-05-24 (×2): qty 45, 90d supply, fill #0
  Filled 2024-08-26: qty 45, 90d supply, fill #1

## 2024-04-29 NOTE — Telephone Encounter (Signed)
*  STAT* If patient is at the pharmacy, call can be transferred to refill team.   1. Which medications need to be refilled? (please list name of each medication and dose if known)   metoprolol  succinate (TOPROL -XL) 25 MG 24 hr tablet    Digoxin  (LANOXIN ) 62.5 MCG TABS    4. Which pharmacy/location (including street and city if local pharmacy) is medication to be sent to?  [facility] [address]     5. Do they need a 30 day or 90 day supply? 90

## 2024-04-29 NOTE — Telephone Encounter (Signed)
 RX sent in

## 2024-04-30 ENCOUNTER — Other Ambulatory Visit (HOSPITAL_COMMUNITY): Payer: Self-pay

## 2024-04-30 ENCOUNTER — Encounter (HOSPITAL_COMMUNITY): Payer: Self-pay

## 2024-04-30 ENCOUNTER — Other Ambulatory Visit: Payer: Self-pay

## 2024-04-30 ENCOUNTER — Other Ambulatory Visit: Payer: Self-pay | Admitting: Internal Medicine

## 2024-05-02 ENCOUNTER — Other Ambulatory Visit (HOSPITAL_COMMUNITY): Payer: Self-pay

## 2024-05-02 ENCOUNTER — Other Ambulatory Visit (HOSPITAL_BASED_OUTPATIENT_CLINIC_OR_DEPARTMENT_OTHER): Payer: Self-pay

## 2024-05-02 ENCOUNTER — Other Ambulatory Visit: Payer: Self-pay | Admitting: Internal Medicine

## 2024-05-04 ENCOUNTER — Encounter (HOSPITAL_COMMUNITY): Payer: Self-pay

## 2024-05-04 ENCOUNTER — Other Ambulatory Visit: Payer: Self-pay

## 2024-05-04 ENCOUNTER — Ambulatory Visit: Admitting: Nurse Practitioner

## 2024-05-04 ENCOUNTER — Other Ambulatory Visit (HOSPITAL_COMMUNITY): Payer: Self-pay

## 2024-05-08 ENCOUNTER — Other Ambulatory Visit (HOSPITAL_COMMUNITY): Payer: Self-pay

## 2024-05-08 ENCOUNTER — Other Ambulatory Visit: Payer: Self-pay | Admitting: Internal Medicine

## 2024-05-08 NOTE — Telephone Encounter (Signed)
 Historical provider has prescribed and is now refusing to refill her stiolto - would you like pt to remain on this ?

## 2024-05-09 MED ORDER — STIOLTO RESPIMAT 2.5-2.5 MCG/ACT IN AERS
2.0000 | INHALATION_SPRAY | Freq: Every morning | RESPIRATORY_TRACT | 3 refills | Status: AC
  Filled 2024-05-09: qty 12, 90d supply, fill #0
  Filled 2024-08-04: qty 12, 90d supply, fill #1

## 2024-05-10 ENCOUNTER — Other Ambulatory Visit (HOSPITAL_COMMUNITY): Payer: Self-pay

## 2024-05-11 ENCOUNTER — Other Ambulatory Visit: Payer: Self-pay

## 2024-05-14 DIAGNOSIS — I5032 Chronic diastolic (congestive) heart failure: Secondary | ICD-10-CM | POA: Diagnosis not present

## 2024-05-14 DIAGNOSIS — R5383 Other fatigue: Secondary | ICD-10-CM | POA: Diagnosis not present

## 2024-05-14 DIAGNOSIS — Z515 Encounter for palliative care: Secondary | ICD-10-CM | POA: Diagnosis not present

## 2024-05-14 DIAGNOSIS — I2510 Atherosclerotic heart disease of native coronary artery without angina pectoris: Secondary | ICD-10-CM | POA: Diagnosis not present

## 2024-05-14 DIAGNOSIS — J449 Chronic obstructive pulmonary disease, unspecified: Secondary | ICD-10-CM | POA: Diagnosis not present

## 2024-05-20 DIAGNOSIS — I480 Paroxysmal atrial fibrillation: Secondary | ICD-10-CM | POA: Diagnosis not present

## 2024-05-21 DIAGNOSIS — F17218 Nicotine dependence, cigarettes, with other nicotine-induced disorders: Secondary | ICD-10-CM | POA: Diagnosis not present

## 2024-05-24 ENCOUNTER — Other Ambulatory Visit (HOSPITAL_COMMUNITY): Payer: Self-pay

## 2024-05-25 ENCOUNTER — Ambulatory Visit: Admitting: Nurse Practitioner

## 2024-05-25 ENCOUNTER — Other Ambulatory Visit: Payer: Self-pay

## 2024-05-25 ENCOUNTER — Other Ambulatory Visit: Payer: Self-pay | Admitting: Licensed Clinical Social Worker

## 2024-05-25 NOTE — Patient Instructions (Signed)
 Visit Information  Thank you for taking time to visit with me today. Please don't hesitate to contact me if I can be of assistance to you before our next scheduled appointment.  Our next appointment is by telephone on 07/13/24 at 10:00 AM   Please call the care guide team at [phone] if you need to cancel or reschedule your appointment.   Following is a copy of your care plan:   Goals Addressed             This Visit's Progress    VBCI Social Work Care Plan       Problems:   Transportation challenges (LCSW gave client the name and phone number of [facility] transport services) Client said she does get some transport help from her neighbor who lives near client             Back pain issues             Needs extra time to complete ADLs            Stress related to recent death of her daughter who had been ill for some time            Vision challenges (blurry vision due to cataracts. Cannot afford to have cataract procedure)              Hx of opiod use  CSW Clinical Goal(s):   Over the next   30   days the Patient will attend all scheduled medical appointments as evidenced by patient report and care team review of appointment completion in electronic MEDICAL RECORD NUMBER .            Over next 30 days patient will use stress reduction activities of choice to help her manage grief issues experienced AEB patient report of decrease in grief symptoms experienced (discussed support from her neighbor, discussed services related to grief support from local Hospice agency)  Interventions:  Spoke with [name] about her status and needs             Emara said her daughter passed away recently and that this had been very difficult for [place]wood. She said she has some support from family members. Numerous family members live in [place] , [place]  area.  She speaks with family members via phone. She has support of her neighbor. LCSW encouraged Garielle to contact local Hospice agency to discuss grief support  services available.            Discussed medication procurement of client            Discussed ambulation of client . She is walking well, she is not using a walker at present. She has not had any recent falls            Discussed client transport needs. LCSW encouraged Danija to call [facility] insurance to make transport arrangements to and from her medical appointments. She is using [facility] insurance to help with transport needs faced. Again, her neighbor is also supportive and provides some transport help for [place] as needed.           Discussed pain issues of client; discussed sleeping issues of client. She said she is sleeping better           Discussed vision needs of client.          [facility] as scheduled. She feels that going to that clinic is very helpful to  her at this time. She is taking prescribed medications as scheduled            Provided counseling support            Estellar said she goes to same PCP office but will have new PCP at same practice at her next office visit at PCP office.          Encouraged client to call LCSW as needed for SW support.at [phone]            Finn was appreciative of call from LCSW today   Patient Goals/Self-Care Activities:  Take medications as prescribed             Make needed transport arrangements for client to attend her upcoming medical appointments             Attend scheduled medical appointments            Allow time to complete ADLs             Allow time for rest and self care             Attend appointments as scheduled for [facility] at [facility]               Call LCSW as needed for SW support at [phone]  Plan:   LCSW to call client on 07/13/24 at 10:00 AM         Please call the [facility]: [phone] if you are experiencing a Mental Health or Behavioral Health Crisis or need someone to talk to.  The patient verbalized understanding of instructions, educational  materials, and care plan provided today and DECLINED offer to receive copy of patient instructions, educational materials, and care plan.    Glendia Pear  MSW, LCSW [place] Licensed Clinical Social Worker Direct Dial:  [phone] Fax:  [phone] Website:  [email or web address]

## 2024-05-25 NOTE — Patient Outreach (Signed)
 Complex Care Management   Visit Note  05/25/2024  Name:  Carrie Case MRN: [id number] DOB: 04-23-1956  Situation: Referral received for Complex Care Management related to grief issues faced I obtained verbal consent from Patient.  Visit completed with Patient  on the phone  Background:   Past Medical History:  Diagnosis Date   A-fib [facility])    Acute respiratory failure with hypoxia (HCC) 12/22/2018   Carotid artery stenosis    CHF (congestive heart failure) (HCC)    COPD (chronic obstructive pulmonary disease) (HCC)    History of home oxygen therapy    HLD (hyperlipidemia)    Memory changes    Osteoporosis    PAD (peripheral artery disease)    Recurrent falls    Tobacco dependence     Assessment: Patient Reported Symptoms:  Cognitive Cognitive Status: Alert and oriented to person, place, and time, Difficulties with attention and concentration Cognitive/Intellectual Conditions Management [RPT]: None reported or documented in medical history or problem list   Health Maintenance Behaviors: Stress management, Sleep adequate Health Facilitated by: Stress management  Neurological Neurological Review of Symptoms: Vision changes Neurological Management Strategies: Coping strategies Neurological Comment: decreased vision  HEENT HEENT Symptoms Reported: No symptoms reported HEENT Management Strategies: Coping strategies    Cardiovascular Cardiovascular Symptoms Reported: Fatigue Does patient have uncontrolled Hypertension?: No Cardiovascular Management Strategies: Coping strategies  Respiratory Respiratory Symptoms Reported: Shortness of breath, Productive cough Other Respiratory Symptoms: productive cough: COPD Additional Respiratory Details: Uses inhaler as prescribed Respiratory Management Strategies: Adequate rest, Coping strategies  Endocrine Endocrine Symptoms Reported: Shortness of breath, Weakness or fatigue, Blurry vision, Shakiness Is patient diabetic?: No     Gastrointestinal Gastrointestinal Symptoms Reported: No symptoms reported Gastrointestinal Management Strategies: Adequate rest    Genitourinary Genitourinary Symptoms Reported: No symptoms reported Genitourinary Management Strategies: Adequate rest, Coping strategies  Integumentary Integumentary Symptoms Reported: Skin changes, Itching Additional Integumentary Details: dry skin; skin itches Skin Management Strategies: Adequate rest, Coping strategies  Musculoskeletal Musculoskelatal Symptoms Reviewed: Unsteady gait, Back pain (not using a walker at present) Musculoskeletal Management Strategies: Coping strategies  Not using a walker at present. Some back pain issues    Psychosocial Psychosocial Symptoms Reported: Sadness - if selected complete PHQ 2-9, Anxiety - if selected complete GAD, Depression - if selected complete PHQ 2-9 Additional Psychological Details: Client's daughter recently passed away. [name] is adjusting to death of her daughter. Discussed Hospice support services available Behavioral Management Strategies: Coping strategies Major Change/Loss/Stressor/Fears (CP): Medical condition, self Techniques to Cope with Loss/Stress/Change: Counseling Quality of Family Relationships: supportive Receives some support from her neighbor    05/25/2024    PHQ2-9 Depression Screening   Little interest or pleasure in doing things Several days  Feeling down, depressed, or hopeless Several days  PHQ-2 - Total Score 2  Trouble falling or staying asleep, or sleeping too much Not at all  Feeling tired or having little energy Several days  Poor appetite or overeating  Several days  Feeling bad about yourself - or that you are a failure or have let yourself or your family down Several days  Trouble concentrating on things, such as reading the newspaper or watching television Several days  Moving or speaking so slowly that other people could have noticed.  Or the opposite - being so fidgety or  restless that you have been moving around a lot more than usual Several days  Thoughts that you would be better off dead, or hurting yourself in some way Not at all  PHQ2-9 Total Score 7  If you checked off any problems, how difficult have these problems made it for you to do your work, take care of things at home, or get along with other people Somewhat difficult  Depression Interventions/Treatment Medication, Counseling    Vitals:  Client did not mention any problem with BP at present. She has BP cuff at home and monitors her BP at home.  Medications Reviewed Today     Reviewed by Frances Ozell GORMAN KEN (Social Worker) on 05/25/24 at 1058  Med List Status: <None>   Medication Order Taking? Sig Documenting Provider Last Dose Status Informant  albuterol  (VENTOLIN  HFA) 108 (90 Base) MCG/ACT inhaler [id number] Yes Inhale 2 puffs into the lungs every 6 (six) hours as needed for wheezing or shortness of breath. Melvenia Manus BRAVO, MD  Active   Digoxin  (LANOXIN ) 62.5 MCG TABS [id number] Yes Take 1 tablet by mouth daily. Miriam Norris, NP  Active   Digoxin  (LANOXIN ) 62.5 MCG TABS [id number] Yes Take 1 tablet by mouth daily. Alvan Dorn FALCON, MD  Active   FLUoxetine (PROZAC) 20 MG capsule [id number] Yes Take 20 mg by mouth daily. [provider]  Active   LORazepam (ATIVAN) 1 MG tablet [id number] Yes Take 1 mg by mouth 3 (three) times daily. [provider]  Active   metoprolol  succinate (TOPROL -XL) 25 MG 24 hr tablet [id number] Yes Take 1/2 tablet (12.5 mg total) by mouth daily. Alvan Dorn FALCON, MD  Active   OLANZapine (ZYPREXA) 7.5 MG tablet [id number] Yes Take 7.5 mg by mouth at bedtime. [provider]  Active   rivaroxaban  (XARELTO ) 20 MG TABS tablet [id number] Yes Take 1 tablet (20 mg total) by mouth daily with evening meal.   Active   rosuvastatin  (CRESTOR ) 20 MG tablet [id number] Yes Take 1 tablet (20 mg total) by mouth daily. Darron Deatrice LABOR, MD  Active    Tiotropium Bromide -Olodaterol (STIOLTO RESPIMAT ) 2.5-2.5 MCG/ACT AERS [id number] Yes Inhale 2 puffs into the lungs in the morning. Darlean Ozell NOVAK, MD  Active   Tiotropium Bromide -Olodaterol 2.5-2.5 MCG/ACT AERS [id number] Yes Inhale 2 puffs into the lungs every 4 (four) hours. [provider]  Active             Recommendation:   PCP Follow-up Continue Current Plan of Care Take medications as prescribed Attend appointments as [facility] Call LCSW as needed for SW support Contact local Hospice agency as needed for grief support for client  Follow Up Plan:   Telephone follow up appointment date/time:  07/13/24 at 10:00 AM   Glendia Frances  MSW, LCSW [place]/Value Based Care [facility] Licensed Clinical Social Worker Direct Dial:  [phone] Fax:  [phone] Website:  [email or web address]

## 2024-05-26 ENCOUNTER — Other Ambulatory Visit: Payer: Self-pay

## 2024-05-26 ENCOUNTER — Other Ambulatory Visit (HOSPITAL_COMMUNITY): Payer: Self-pay

## 2024-05-27 ENCOUNTER — Other Ambulatory Visit: Payer: Self-pay

## 2024-05-28 ENCOUNTER — Other Ambulatory Visit: Payer: Self-pay

## 2024-05-28 ENCOUNTER — Ambulatory Visit: Payer: Self-pay | Admitting: Nurse Practitioner

## 2024-05-28 ENCOUNTER — Telehealth: Payer: Self-pay

## 2024-05-28 NOTE — Telephone Encounter (Signed)
-----   Message from Almarie Crate sent at 05/28/2024  1:03 PM EDT ----- Regarding: RE: pt told she has a murmur I appreciate her letting us  know. More than likely d/t her mild to moderate mitral regurgitation. If she is not having symptoms, then we will just monitor. She is scheduled to see me in December and will address it then.   Thanks!   Best, Almarie Crate, NP ----- Message ----- From: Bernett Dorothyann LABOR, RN Sent: 05/28/2024  12:54 PM EDT To: Almarie Crate, NP Subject: pt told she has a murmur                       Patient says a heart health nurse cam e to her house today and told her she has a murmur and to address with cardiology.

## 2024-05-28 NOTE — Telephone Encounter (Signed)
 Patient informed to keep upcoming appointment and that Almarie is aware.

## 2024-05-29 ENCOUNTER — Other Ambulatory Visit (HOSPITAL_COMMUNITY): Payer: Self-pay

## 2024-06-01 ENCOUNTER — Other Ambulatory Visit (HOSPITAL_COMMUNITY): Payer: Self-pay

## 2024-06-09 ENCOUNTER — Other Ambulatory Visit (HOSPITAL_COMMUNITY): Payer: Self-pay

## 2024-06-10 ENCOUNTER — Other Ambulatory Visit: Payer: Self-pay | Admitting: Internal Medicine

## 2024-06-10 ENCOUNTER — Other Ambulatory Visit (HOSPITAL_COMMUNITY): Payer: Self-pay

## 2024-06-10 ENCOUNTER — Other Ambulatory Visit: Payer: Self-pay

## 2024-06-10 DIAGNOSIS — J449 Chronic obstructive pulmonary disease, unspecified: Secondary | ICD-10-CM

## 2024-06-11 ENCOUNTER — Encounter (HOSPITAL_COMMUNITY): Payer: Self-pay

## 2024-06-11 ENCOUNTER — Other Ambulatory Visit: Payer: Self-pay

## 2024-06-11 ENCOUNTER — Other Ambulatory Visit (HOSPITAL_COMMUNITY): Payer: Self-pay

## 2024-06-16 ENCOUNTER — Telehealth: Payer: Self-pay

## 2024-06-16 ENCOUNTER — Other Ambulatory Visit: Payer: Self-pay

## 2024-06-16 DIAGNOSIS — Z1211 Encounter for screening for malignant neoplasm of colon: Secondary | ICD-10-CM

## 2024-06-16 DIAGNOSIS — Z780 Asymptomatic menopausal state: Secondary | ICD-10-CM

## 2024-06-16 DIAGNOSIS — Z5320 Procedure and treatment not carried out because of patient's decision for unspecified reasons: Secondary | ICD-10-CM

## 2024-06-16 NOTE — Telephone Encounter (Signed)
 Spoke with patient. She was previously a Dr. Melvenia patient. Was scheduled to see Neale Carpen, however, that provider left the office before her visit could be completed and patient was never contacted to be scheduled with another provider. I scheduled patient to see Leita Longs. Patients AWV was scheduled. Patient refused referral for colonoscopy and mammogram. She did agree to a cologuard and a bone density scan. Dexa ordered for [facility] in [place] per patient request and she was provided the phone number to call and schedule that appointment.   Virtual AWVS scheduled for 06/25/2024 with Railey Glad W CMA (AAMA)

## 2024-06-25 ENCOUNTER — Ambulatory Visit (INDEPENDENT_AMBULATORY_CARE_PROVIDER_SITE_OTHER)

## 2024-06-25 VITALS — BP 127/72 | Ht 66.0 in | Wt 155.0 lb

## 2024-06-25 DIAGNOSIS — Z5320 Procedure and treatment not carried out because of patient's decision for unspecified reasons: Secondary | ICD-10-CM

## 2024-06-25 DIAGNOSIS — Z0000 Encounter for general adult medical examination without abnormal findings: Secondary | ICD-10-CM | POA: Diagnosis not present

## 2024-06-25 NOTE — Patient Instructions (Signed)
 Carrie Case,  Thank you for taking the time for your Medicare Wellness Visit. I appreciate your continued commitment to your health goals. Please review the care plan we discussed, and feel free to reach out if I can assist you further.  Please note that Annual Wellness Visits do not include a physical exam. Some assessments may be limited, especially if the visit was conducted virtually. If needed, we may recommend an in-person follow-up with your provider.  Ongoing Care Seeing your primary care provider every 3 to 6 months helps us  monitor your health and provide consistent, personalized care.   Referrals If a referral was made during today's visit and you haven't received any updates within two weeks, please contact the referred provider directly to check on the status.  Recommended Screenings:  Health Maintenance  Topic Date Due   Zoster (Shingles) Vaccine (1 of 2) Never done   DTaP/Tdap/Td vaccine (2 - Td or Tdap) 10/23/2016   Flu Shot  Never done   DEXA scan (bone density measurement)  06/04/2024   Pneumococcal Vaccine for age over 17 (1 of 2 - PCV) 04/08/2025*   Medicare Annual Wellness Visit  06/25/2025   Hepatitis C Screening  Completed   Meningitis B Vaccine  Aged Out   Screening for Lung Cancer  Discontinued   Breast Cancer Screening  Discontinued   Colon Cancer Screening  Discontinued   COVID-19 Vaccine  Discontinued  *Topic was postponed. The date shown is not the original due date.       06/25/2024   10:12 AM  Advanced Directives  Does Patient Have a Medical Advance Directive? No  Would patient like information on creating a medical advance directive? No - Patient declined    Vision: Annual vision screenings are recommended for early detection of glaucoma, cataracts, and diabetic retinopathy. These exams can also reveal signs of chronic conditions such as diabetes and high blood pressure.  Dental: Annual dental screenings help detect early signs of oral cancer,  gum disease, and other conditions linked to overall health, including heart disease and diabetes.  Please see the attached documents for additional preventive care recommendations.

## 2024-06-25 NOTE — Progress Notes (Signed)
 I connected with  [name] KATHEE Saltness on 06/25/24 by a audio enabled telemedicine application and verified that I am speaking with the correct person using two identifiers.  Patient Location: Home  Provider Location: Office/Clinic  I discussed the limitations of evaluation and management by telemedicine. The patient expressed understanding and agreed to proceed.  Subjective:   Carrie Case is a 68 y.o. female who presents for a Medicare Annual Wellness Visit.  Allergies (verified) Ibuprofen   History: Past Medical History:  Diagnosis Date   A-fib (HCC)    Acute respiratory failure with hypoxia (HCC) 12/22/2018   Carotid artery stenosis    CHF (congestive heart failure) (HCC)    COPD (chronic obstructive pulmonary disease) (HCC)    History of home oxygen therapy    HLD (hyperlipidemia)    Memory changes    Osteoporosis    PAD (peripheral artery disease)    Recurrent falls    Tobacco dependence    Past Surgical History:  Procedure Laterality Date   CARDIOVERSION N/A 06/24/2023   Procedure: CARDIOVERSION (CATH LAB);  Surgeon: Kate Lonni CROME, MD;  Location[facility] INVASIVE CV LAB;  Service: Cardiovascular;  Laterality: N/A;   TUBAL LIGATION     Family History  Problem Relation Age of Onset   Hypertension Mother    Hyperlipidemia Mother    Cancer Mother    COPD Mother    Anxiety disorder Mother    Social History   Occupational History   Occupation: unemployed  Tobacco Use   Smoking status: Every Day    Current packs/day: 0.25    Average packs/day: 1.8 packs/day for 48.8 years (86.5 ttl pk-yrs)    Types: Cigarettes    Start date: 76    Passive exposure: Never   Smokeless tobacco: Never  Vaping Use   Vaping status: Never Used  Substance and Sexual Activity   Alcohol use: No   Drug use: No   Sexual activity: Not Currently   Tobacco Counseling Ready to quit: Yes Counseling given: Yes  SDOH Screenings   Food Insecurity: Patient Declined  (06/25/2024)  Housing: Low Risk  (06/25/2024)  Transportation Needs: Patient Declined (06/25/2024)  Utilities: Not At Risk (06/25/2024)  Alcohol Screen: Low Risk  (11/15/2022)  Depression (PHQ2-9): High Risk (06/25/2024)  Financial Resource Strain: High Risk (02/21/2024)   Received from [facility]  Physical Activity: Inactive (06/25/2024)  Social Connections: Socially Isolated (06/25/2024)  Stress: No Stress Concern Present (06/25/2024)  Recent Concern: Stress - Stress Concern Present (04/14/2024)  Tobacco Use: High Risk (06/25/2024)  Health Literacy: Adequate Health Literacy (06/25/2024)   Depression Screen    06/25/2024   10:15 AM 05/25/2024   11:11 AM 05/25/2024   11:01 AM 05/25/2024   10:59 AM 04/14/2024   10:04 AM 04/14/2024    9:51 AM 03/16/2024    9:56 AM  PHQ 2/9 Scores  PHQ - 2 Score 6 2 2 2 2 2 2   PHQ- 9 Score 12  7  7  7  8  8  9       Data saved with a previous flowsheet row definition     Goals Addressed               This Visit's Progress     I want to lose 15-20 lbs (pt-stated)         Visit info / Clinical Intake: Medicare Wellness Visit Type:: Subsequent Annual Wellness Visit Medicare Wellness Visit Mode:: Telephone If telephone:: video declined If telephone  or video:: pt reported vitals Interpreter Needed?: No Pre-visit prep was completed: yes AWV questionnaire completed by patient prior to visit?: no Living arrangements:: (!) lives alone Typical amount of pain: none Does pain affect daily life?: no Are you currently prescribed opioids?: no  Dietary Habits and Nutritional Risks How many meals a day?: (!) 1 Eats fruit and vegetables daily?: yes Most meals are obtained by: preparing own meals Diabetic:: no  Functional Status Activities of Daily Living (to include ambulation/medication): Independent Ambulation: Independent Medication Administration: Independent Home Management: Independent Manage your own finances?: yes Primary transportation is:  family/friends Concerns about vision?: (!) yes Concerns about hearing?: no  Fall Screening Falls in the past year?: 0 Number of falls in past year: 0 Was there an injury with Fall?: 0 Fall Risk Category Calculator: 0 Patient Fall Risk Level: Low Fall Risk  Fall Risk Patient at Risk for Falls Due to: No Fall Risks Fall risk Follow up: Falls evaluation completed; Education provided; Falls prevention discussed  Home and Transportation Safety: All rugs have non-skid backing?: (!) no All stairs or steps have railings?: N/A, no stairs Grab bars in the bathtub or shower?: yes Have non-skid surface in bathtub or shower?: (!) no Good home lighting?: yes Regular seat belt use?: yes Hospital stays in the last year:: (!) yes How many hospital stays:: 1 Reason: COPD exacerbation  Cognitive Assessment Difficulty concentrating, remembering, or making decisions? : no Will 6CIT or Mini Cog be Completed: no 6CIT or Mini Cog Declined: patient alert, oriented, able to answer questions appropriately and recall recent events  Advance Directives (For Healthcare) Does Patient Have a Medical Advance Directive?: No Would patient like information on creating a medical advance directive?: No - Patient declined  Reviewed/Updated  Reviewed/Updated: All        Objective:    Today's Vitals   06/25/24 1005  BP: 127/72  Weight: 155 lb (70.3 kg)  Height: 5' 6 (1.676 m)  PainSc: 0-No pain   Body mass index is 25.02 kg/m.  Current Medications (verified) Outpatient Encounter Medications as of 06/25/2024  Medication Sig   albuterol  (VENTOLIN  HFA) 108 (90 Base) MCG/ACT inhaler Inhale 2 puffs into the lungs every 6 (six) hours as needed for wheezing or shortness of breath.   Digoxin  (LANOXIN ) 62.5 MCG TABS Take 1 tablet by mouth daily.   Digoxin  (LANOXIN ) 62.5 MCG TABS Take 1 tablet by mouth daily.   FLUoxetine (PROZAC) 20 MG capsule Take 20 mg by mouth daily.   LORazepam (ATIVAN) 1 MG tablet Take  1 mg by mouth 3 (three) times daily.   metoprolol  succinate (TOPROL -XL) 25 MG 24 hr tablet Take 1/2 tablet (12.5 mg total) by mouth daily.   OLANZapine (ZYPREXA) 7.5 MG tablet Take 7.5 mg by mouth at bedtime.   rivaroxaban  (XARELTO ) 20 MG TABS tablet Take 1 tablet (20 mg total) by mouth daily with evening meal.   rosuvastatin  (CRESTOR ) 20 MG tablet Take 1 tablet (20 mg total) by mouth daily.   Tiotropium Bromide -Olodaterol (STIOLTO RESPIMAT ) 2.5-2.5 MCG/ACT AERS Inhale 2 puffs into the lungs in the morning.   Tiotropium Bromide -Olodaterol 2.5-2.5 MCG/ACT AERS Inhale 2 puffs into the lungs every 4 (four) hours.   No facility-administered encounter medications on file as of 06/25/2024.   Hearing/Vision screen Hearing Screening - Comments:: Patient denies any hearing difficulties.   Vision Screening - Comments:: Wears rx glasses - up to date with routine eye exams with  [facility].   Patient still has difficulty  seeing LEFT is worse than RIGHT due to cataracts. Resources provided to patient. She is unable to afford the almost $800 cost for surgery Immunizations and Health Maintenance Health Maintenance  Topic Date Due   Zoster Vaccines- Shingrix (1 of 2) Never done   DTaP/Tdap/Td (2 - Td or Tdap) 10/23/2016   Influenza Vaccine  Never done   DEXA SCAN  06/04/2024   Pneumococcal Vaccine: 50+ Years (1 of 2 - PCV) 04/08/2025 (Originally 03/12/1975)   Medicare Annual Wellness (AWV)  06/25/2025   Hepatitis C Screening  Completed   Meningococcal B Vaccine  Aged Out   Lung Cancer Screening  Discontinued   Mammogram  Discontinued   Colonoscopy  Discontinued   COVID-19 Vaccine  Discontinued        Assessment/Plan:  This is a routine wellness examination for [place].  Patient Care Team: Bevely Doffing, FNP as PCP - General (Family Medicine) Frances, Ozell RAMAN, LCSW as [facility] Care Management (Licensed Clinical Social Worker) Darlean, Ozell NOVAK, MD as Consulting Physician  (Pulmonary Disease) Frances Ozell RAMAN HUGHS as VBCI Care Management (Licensed Clinical Social Worker) Alvan, Dorn FALCON, MD as Consulting Physician (Cardiology)  I have personally reviewed and noted the following in the patient's chart:   Medical and social history Use of alcohol, tobacco or illicit drugs  Current medications and supplements including opioid prescriptions. Functional ability and status Nutritional status Physical activity Advanced directives List of other physicians Hospitalizations, surgeries, and ER visits in previous 12 months Vitals Screenings to include cognitive, depression, and falls Referrals and appointments  No orders of the defined types were placed in this encounter.  In addition, I have reviewed and discussed with patient certain preventive protocols, quality metrics, and best practice recommendations. A written personalized care plan for preventive services as well as general preventive health recommendations were provided to patient.   Bentli Llorente, CMA   06/25/2024   No follow-ups on file.  After Visit Summary: (MyChart) Due to this being a telephonic visit, the after visit summary with patients personalized plan was offered to patient via MyChart   Nurse Notes: patient declined mammogram and colonoscopy and lung cancer screening

## 2024-06-29 ENCOUNTER — Other Ambulatory Visit: Payer: Self-pay

## 2024-06-29 ENCOUNTER — Other Ambulatory Visit (HOSPITAL_COMMUNITY): Payer: Self-pay

## 2024-07-01 ENCOUNTER — Ambulatory Visit

## 2024-07-01 ENCOUNTER — Other Ambulatory Visit: Payer: Self-pay

## 2024-07-07 ENCOUNTER — Other Ambulatory Visit (HOSPITAL_BASED_OUTPATIENT_CLINIC_OR_DEPARTMENT_OTHER): Payer: Self-pay

## 2024-07-07 MED ORDER — ALBUTEROL SULFATE HFA 108 (90 BASE) MCG/ACT IN AERS
1.0000 | INHALATION_SPRAY | RESPIRATORY_TRACT | 0 refills | Status: DC | PRN
  Filled 2024-07-07: qty 6.7, 17d supply, fill #0

## 2024-07-07 MED ORDER — ALBUTEROL SULFATE (2.5 MG/3ML) 0.083% IN NEBU
3.0000 mL | INHALATION_SOLUTION | RESPIRATORY_TRACT | 0 refills | Status: DC | PRN
  Filled 2024-07-07: qty 90, 5d supply, fill #0

## 2024-07-13 ENCOUNTER — Other Ambulatory Visit: Payer: Self-pay | Admitting: Licensed Clinical Social Worker

## 2024-07-13 NOTE — Patient Instructions (Signed)
 Visit Information  Thank you for taking time to visit with me today. Please don't hesitate to contact me if I can be of assistance to you before our next scheduled appointment.  Our next appointment is by telephone on 08/18/24 at 2:30 PM   Please call the care guide team at [phone] if you need to cancel or reschedule your appointment.   Following is a copy of your care plan:   Goals Addressed             This Visit's Progress    VBCI Social Work Care Plan       Problems:   Transportation challenges (LCSW gave client the name and phone number of [facility] transport services) Client said she does get some transport help from her neighbor who lives near client             Back pain issues             Needs extra time to complete ADLs            Stress related to recent death of her daughter who had been ill for some time            Vision challenges (blurry vision due to cataracts. Cannot afford to have cataract procedure)              Hx of opiod use  CSW Clinical Goal(s):   Over the next   30   days the Patient will attend all scheduled medical appointments as evidenced by patient report and care team review of appointment completion in electronic MEDICAL RECORD NUMBER .            Over next 30 days patient will use stress reduction activities of choice to help her manage grief issues experienced AEB patient report of decrease in grief symptoms experienced (discussed support from her neighbor, discussed services related to grief support from local Hospice agency, spoke of  phone support from her son and her daughter)  Interventions:  Spoke with Carrie Case about her status and needs             Carrie Case said her daughter passed away recently and that this had been very difficult for Carrie Case. She said she has some support from family members. Numerous family members live in [place] , [place]  area.  She speaks with family members via phone. She has support of her neighbor. LCSW encouraged Carrie Case to  contact local Hospice agency to discuss grief support services available. Carrie Case said nurse made home visit recently and nurse also talked with Carrie Case about grief support help through local Hospice.             Discussed medication procurement of client            Discussed ambulation of client . She is walking well, she is not using a walker at present. She has not had any recent falls            Discussed client transport needs. LCSW encouraged Carrie Case to call [facility] insurance to make transport arrangements to and from her medical appointments. She is using [facility] insurance to help with transport needs faced. Again, her neighbor is also supportive and provides some transport help for Carrie Case as needed.           Discussed pain issues of client; discussed sleeping issues of client. She said she is sleeping better           Discussed vision needs of  client. She has cataract on each eye. She said she is trying to save money to be able to afford to have a cataract procedure done         [facility] as scheduled. She feels that going to that clinic is very helpful to her at this time. She is taking prescribed medications as scheduled            Provided counseling support            Used Active Listening to allow client to share client feelings            Carrie Case said she goes to same PCP office but will have new PCP at same practice at her next office visit at PCP office.  Her next PCP office visit is with new NP this Wednesday July 15, 2024. Carrie Case said she has had a weight gain in recent months. She said she plans to talk with NP about her weight gain over past 3 months.         Encouraged client to call LCSW as needed for SW support.at [phone]            Carrie Case was appreciative of call from LCSW today   Patient Goals/Self-Care Activities:  Take medications as prescribed             Make needed transport arrangements for client to attend her upcoming medical appointments              Attend scheduled medical appointments            Allow time to complete ADLs             Allow time for rest and self care             Attend appointments as scheduled for client at the [facility]               Call LCSW as needed for SW support at [phone]  Plan:   LCSW to call client on 08/18/24 at 2:30 PM          Please call the [facility]: [phone] if you are experiencing a Mental Health or Behavioral Health Crisis or need someone to talk to.  Patient verbalized understanding of Care plan and visit instructions communicated this visit   Carrie Case  MSW, LCSW [place]/Value Based Care [facility] Licensed Clinical Social Worker Direct Dial:  [phone] Fax:  [phone] Website:  [email or web address]

## 2024-07-13 NOTE — Patient Outreach (Signed)
 Complex Care Management   Visit Note  07/22/2024  Name:  Carrie Case MRN: [id number] DOB: 1956/02/04  Situation: Referral received for Complex Care Management related to grief issues; depression issues  I obtained verbal consent from Patient.  Visit completed with Patient  on the phone  Background:   Past Medical History:  Diagnosis Date   A-fib [facility])    Acute respiratory failure with hypoxia (HCC) 12/22/2018   Carotid artery stenosis    CHF (congestive heart failure) (HCC)    COPD (chronic obstructive pulmonary disease) (HCC)    History of home oxygen  therapy    HLD (hyperlipidemia)    Memory changes    Osteoporosis    PAD (peripheral artery disease)    Recurrent falls    Tobacco dependence     Assessment: Patient Reported Symptoms:  Cognitive Cognitive Status: Alert and oriented to person, place, and time, Able to follow simple commands Cognitive/Intellectual Conditions Management [RPT]: None reported or documented in medical history or problem list   Health Maintenance Behaviors: Stress management  Neurological Neurological Review of Symptoms: Vision changes (vision issues. wears glasses. Cataract on both eyes. Trying to save money to have cataract procedure) Neurological Management Strategies: Coping strategies  HEENT HEENT Symptoms Reported: No symptoms reported HEENT Management Strategies: Coping strategies    Cardiovascular Cardiovascular Symptoms Reported: Fatigue Cardiovascular Management Strategies: Coping strategies  Respiratory Respiratory Symptoms Reported: Shortness of breath, Productive cough Additional Respiratory Details: uses inhaler as prescribed Respiratory Management Strategies: Adequate rest, Coping strategies  Endocrine Endocrine Symptoms Reported: Weakness or fatigue, Shortness of breath Is patient diabetic?: No    Gastrointestinal Gastrointestinal Symptoms Reported: Unintentional weight gain Additional Gastrointestinal Details: client said  she has gained some weight Gastrointestinal Management Strategies: Adequate rest    Genitourinary Genitourinary Symptoms Reported: No symptoms reported Genitourinary Management Strategies: Coping strategies  Integumentary Integumentary Symptoms Reported: Skin changes Additional Integumentary Details: occasionally has itchy skin; is using lotion Skin Management Strategies: Adequate rest, Coping strategies  Musculoskeletal Musculoskelatal Symptoms Reviewed: Back pain, Muscle pain Musculoskeletal Management Strategies: Coping strategies      Psychosocial Psychosocial Symptoms Reported: Sadness - if selected complete PHQ 2-9, Anxiety - if selected complete GAD, Depression - if selected complete PHQ 2-9 Additional Psychological Details: Nurse has talked with [name] about Hospice support. LCSW has talked with [name] about Hospice support Behavioral Management Strategies: Coping strategies Major Change/Loss/Stressor/Fears (CP): Death of a loved one      22-Jul-2024    PHQ2-9 Depression Screening   Little interest or pleasure in doing things More than half the days  Feeling down, depressed, or hopeless More than half the days  PHQ-2 - Total Score 4  Trouble falling or staying asleep, or sleeping too much Several days  Feeling tired or having little energy Several days  Poor appetite or overeating  Several days  Feeling bad about yourself - or that you are a failure or have let yourself or your family down Several days  Trouble concentrating on things, such as reading the newspaper or watching television Several days  Moving or speaking so slowly that other people could have noticed.  Or the opposite - being so fidgety or restless that you have been moving around a lot more than usual Several days  Thoughts that you would be better off dead, or hurting yourself in some way Not at all  PHQ2-9 Total Score 10  If you checked off any problems, how difficult have these problems made it for you to do  your work, take care of things at home, or get along with other people Somewhat difficult  Depression Interventions/Treatment Medication, Counseling    Today's Vitals  BP in normal range per client information  Pain Scale: 0-10 Pain Score: 0-No pain Pain Intervention(s): Relaxation  Medications Reviewed Today     Reviewed by Frances Ozell GORMAN KEN (Social Worker) on 07/13/24 at 1022  Med List Status: <None>   Medication Order Taking? Sig Documenting Provider Last Dose Status Informant  albuterol  (PROVENTIL ) (2.5 MG/3ML) 0.083% nebulizer solution [id number] Yes Inhale 3 mLs into the lungs every 4 (four) - 6 (six) hours as needed for shortness of breath or wheezing.   Active   albuterol  (VENTOLIN  HFA) 108 (90 Base) MCG/ACT inhaler [id number] Yes Inhale 2 puffs into the lungs every 6 (six) hours as needed for wheezing or shortness of breath. Melvenia Manus BRAVO, MD  Active   albuterol  (VENTOLIN  HFA) 108 [phone] Base) MCG/ACT inhaler [id number] unknown Inhale 1 puff into the lungs every 4 (four) to 6 (six) hours as needed.   Active   Digoxin  (LANOXIN ) 62.5 MCG TABS [id number] Yes Take 1 tablet by mouth daily. Miriam Norris, NP  Active   Digoxin  (LANOXIN ) 62.5 MCG TABS [id number] unknown Take 1 tablet by mouth daily. Alvan Dorn FALCON, MD  Active   FLUoxetine (PROZAC) 20 MG capsule [id number] Yes Take 20 mg by mouth daily. [provider]  Active   LORazepam (ATIVAN) 1 MG tablet [id number] Yes Take 1 mg by mouth 3 (three) times daily. [provider]  Active   metoprolol  succinate (TOPROL -XL) 25 MG 24 hr tablet [id number] Yes Take 1/2 tablet (12.5 mg total) by mouth daily. Alvan Dorn FALCON, MD  Active   OLANZapine (ZYPREXA) 7.5 MG tablet [id number] Yes Take 7.5 mg by mouth at bedtime. [provider]  Active   rivaroxaban  (XARELTO ) 20 MG TABS tablet [id number] Yes Take 1 tablet (20 mg total) by mouth daily with evening meal.   Active   rosuvastatin  (CRESTOR ) 20 MG tablet  [id number] Yes Take 1 tablet (20 mg total) by mouth daily. Darron Deatrice LABOR, MD  Active   Tiotropium Bromide -Olodaterol (STIOLTO RESPIMAT ) 2.5-2.5 MCG/ACT AERS [id number] unknown Inhale 2 puffs into the lungs in the morning. Darlean Ozell NOVAK, MD  Active   Tiotropium Bromide -Olodaterol 2.5-2.5 MCG/ACT AERS [id number] Yes Inhale 2 puffs into the lungs every 4 (four) hours. [provider]  Active             Recommendation:   PCP Follow-up Continue Current Plan of Care Take medications as prescribed Attend appointments as scheduled at [facility] Call LCSW as needed for SW support Consider calling local Hospice agency to speak about grief support services available  Follow Up Plan:   Telephone follow up appointment date/time:  08/18/24 at 2;30 PM    Glendia Frances  MSW, LCSW [place]/Value Based Care [facility] Licensed Clinical Social Worker Direct Dial:  [phone] Fax:  [phone] Website:  [email or web address]

## 2024-07-15 ENCOUNTER — Encounter: Payer: Self-pay | Admitting: Pharmacist

## 2024-07-15 ENCOUNTER — Other Ambulatory Visit (HOSPITAL_COMMUNITY): Payer: Self-pay

## 2024-07-15 ENCOUNTER — Ambulatory Visit

## 2024-07-15 ENCOUNTER — Other Ambulatory Visit (HOSPITAL_BASED_OUTPATIENT_CLINIC_OR_DEPARTMENT_OTHER): Payer: Self-pay

## 2024-07-15 ENCOUNTER — Other Ambulatory Visit: Payer: Self-pay

## 2024-07-15 VITALS — BP 144/80 | HR 56 | Resp 16 | Ht 66.0 in | Wt 146.0 lb

## 2024-07-15 DIAGNOSIS — F331 Major depressive disorder, recurrent, moderate: Secondary | ICD-10-CM | POA: Diagnosis not present

## 2024-07-15 DIAGNOSIS — R051 Acute cough: Secondary | ICD-10-CM | POA: Diagnosis not present

## 2024-07-15 DIAGNOSIS — J449 Chronic obstructive pulmonary disease, unspecified: Secondary | ICD-10-CM | POA: Diagnosis not present

## 2024-07-15 MED ORDER — ALBUTEROL SULFATE HFA 108 (90 BASE) MCG/ACT IN AERS
2.0000 | INHALATION_SPRAY | Freq: Four times a day (QID) | RESPIRATORY_TRACT | 2 refills | Status: AC | PRN
  Filled 2024-07-15: qty 13.4, 50d supply, fill #0
  Filled 2024-07-20: qty 6.7, 25d supply, fill #0

## 2024-07-15 MED ORDER — ALBUTEROL SULFATE (2.5 MG/3ML) 0.083% IN NEBU
3.0000 mL | INHALATION_SOLUTION | RESPIRATORY_TRACT | 3 refills | Status: AC | PRN
  Filled 2024-07-15 – 2024-07-26 (×2): qty 90, 5d supply, fill #0

## 2024-07-15 MED ORDER — DOXYCYCLINE HYCLATE 100 MG PO TABS
100.0000 mg | ORAL_TABLET | Freq: Two times a day (BID) | ORAL | 0 refills | Status: AC
  Filled 2024-07-15: qty 14, 7d supply, fill #0

## 2024-07-15 NOTE — Progress Notes (Signed)
 Established Patient Office Visit  Subjective   Patient ID: Carrie Case, female    DOB: 1955/12/10  Age: 68 y.o. MRN: [id number]  Chief Complaint  Patient presents with   Medical Management of Chronic Issues    Follow up     HPI  Patient Active Problem List   Diagnosis Date Noted   Chronic respiratory failure with hypoxia (HCC) 04/08/2024   Malnutrition of moderate degree 06/24/2023   Hypokalemia 06/23/2023   Acute on chronic diastolic CHF (congestive heart failure) (HCC) 06/22/2023   Erythrocytosis 03/25/2023   Chest tightness 12/12/2022   Memory changes 07/16/2022   Acute oral pain 06/24/2022   Osteoporosis 06/24/2022   Hyperkalemia 06/24/2022   Refused influenza vaccine 05/21/2022   Hyperlipidemia 05/21/2022   Dry cough 05/21/2022   Lower extremity pain, posterior, right 05/21/2022   Insomnia 05/21/2022   Encounter to establish care 05/21/2022   Wrist drop 11/28/2020   Visual field cut 09/26/2020   Moderate episode of recurrent major depressive disorder (HCC) 09/26/2020   Bilateral carotid artery stenosis 02/03/2019   COPD  GOLD 2 / still smoking  01/13/2019   Cigarette smoker 12/22/2018   PAF (paroxysmal atrial fibrillation) (HCC) 12/22/2018      ROS    Objective:     BP (!) 144/80   Pulse (!) 56   Resp 16   Ht 5' 6 (1.676 m)   Wt 146 lb 0.6 oz (66.2 kg)   SpO2 92%   BMI 23.57 kg/m  BP Readings from Last 3 Encounters:  07/15/24 (!) 144/80  06/25/24 127/72  04/16/24 124/72   Wt Readings from Last 3 Encounters:  07/15/24 146 lb 0.6 oz (66.2 kg)  06/25/24 155 lb (70.3 kg)  04/16/24 124 lb (56.2 kg)     Physical Exam Vitals and nursing note reviewed.  Constitutional:      Appearance: Normal appearance.  HENT:     Head: Normocephalic.  Eyes:     Extraocular Movements: Extraocular movements intact.     Pupils: Pupils are equal, round, and reactive to light.  Cardiovascular:     Rate and Rhythm: Normal rate and regular rhythm.   Pulmonary:     Effort: Pulmonary effort is normal.     Breath sounds: Normal breath sounds.  Musculoskeletal:     Cervical back: Normal range of motion and neck supple.  Neurological:     Mental Status: She is alert and oriented to person, place, and time.  Psychiatric:        Mood and Affect: Mood normal.        Thought Content: Thought content normal.    No results found for any visits on 07/15/24.    The ASCVD Risk score (Arnett DK, et al., 2019) failed to calculate for the following reasons:   Risk score cannot be calculated because patient has a medical history suggesting prior/existing ASCVD    Assessment & Plan:   Problem List Items Addressed This Visit       Respiratory   COPD  GOLD 2 / still smoking    Managed by cardiology.  Currently managed with Stiloto.  Albuterol  refilled for prn use.        Relevant Medications   albuterol  (PROVENTIL ) (2.5 MG/3ML) 0.083% nebulizer solution   albuterol  (VENTOLIN  HFA) 108 (90 Base) MCG/ACT inhaler     Other   Moderate episode of recurrent major depressive disorder (HCC) - Primary   Currently stable with fluoxetine 20 mg.  No medication changes  made today.        Other Visit Diagnoses       Acute cough       Relevant Medications   doxycycline  (VIBRA -TABS) 100 MG tablet       No follow-ups on file.    Leita Longs, FNP

## 2024-07-19 NOTE — Assessment & Plan Note (Signed)
 Currently stable with fluoxetine 20 mg.  No medication changes made today.

## 2024-07-19 NOTE — Assessment & Plan Note (Signed)
 Managed by cardiology.  Currently managed with Stiloto.  Albuterol  refilled for prn use.

## 2024-07-20 ENCOUNTER — Other Ambulatory Visit (HOSPITAL_COMMUNITY): Payer: Self-pay

## 2024-07-20 ENCOUNTER — Other Ambulatory Visit: Payer: Self-pay

## 2024-07-26 ENCOUNTER — Other Ambulatory Visit (HOSPITAL_COMMUNITY): Payer: Self-pay

## 2024-07-27 ENCOUNTER — Other Ambulatory Visit: Payer: Self-pay

## 2024-07-28 ENCOUNTER — Other Ambulatory Visit: Payer: Self-pay

## 2024-07-30 ENCOUNTER — Ambulatory Visit: Admitting: Nurse Practitioner

## 2024-07-30 NOTE — Progress Notes (Deleted)
 Cardiology Office Note:  .   Date:  04/16/2024 ID:  Carrie Case, DOB 1955-10-19, MRN [id number] PCP: Bevely Doffing, FNP  [facility] Providers Cardiologist:  None    History of Present Illness: .   Carrie Case is a 68 y.o. female with a PMH of chronic HFpEF, A-fib, PAD, HLD, carotid artery stenosis, tobacco use, and COPD, who presents today for scheduled follow-up.   Presented to [facility] on June 21, 2023 for A-fib with RVR, CHF, pneumonia, UTI, she was transferred to [facility] for further care.  BNP 746.  AST 109, ALT 52.  She was placed on amiodarone  drip and received Lasix .  Underwent DCCV on June 24, 2023.  The following day she went back to A-fib with RVR.  Also received 2 doses of digoxin , was restarted on Toprol  and also on Cardizem  CD3 100 mg daily at baseline.  Updated echo revealed EF 55 to 60%, mild to moderate AR, mild LVH and preserved RV function.  It was felt that CHF exacerbation was likely related to A-fib with RVR.  Was treated with antibiotics for CAP.  Last telephone visit on 01/23/2024. Fatigue remained the same, Digoxin  was d/c.   Hospitalized July 2025 due to acute respiratory failure with hypoxia and COPD exacerbation.  Hospital course also noted by A-fib with RVR.  Heart rate in 150s on arrival.  Was found to be hypoxic on arrival with oxygen  saturation in the 70s.  It was noted that on admission patient admitted to use of Xanax  2 mg 3 times daily at home that she was not prescribed.  Daughter stated that patient has started snorting large amounts of both opioids and benzos at home daily.  During hospitalization, she required a sitter and restraints.  UDS was positive for benzos only.  CTA was negative for PE, bronchiolitis picture.  Qualified for 2 L on oxygen  at home.  A-fib with RVR was difficult to control during patient's withdrawals.  Discharged on amiodarone , digoxin , and metoprolol  as well as Xarelto .  Did convert back to normal  sinus rhythm.  Today she presents for hospital follow-up. She is now following local opioid clinic for assistance from substance use disorder. Sadly, tells me her daughter passed away since last telephone visit. Overall doing well. Denies any acute cardiac complaints or issues. Denies any chest pain, shortness of breath, palpitations, syncope, presyncope, dizziness, orthopnea, PND, swelling or significant weight changes, acute bleeding, or claudication.   ROS: Negative. Seen HPI.   Studies Reviewed: SABRA    EKG: EKG is not ordered today.      Carotid duplex 01/2024:  Summary:  Right Carotid: Velocities in the right ICA are consistent with a 1-39%  stenosis.                Non-hemodynamically significant plaque <50% noted in the  CCA. The                 ECA appears <50% stenosed.   Left Carotid: Velocities in the left ICA are consistent with a 40-59%  stenosis.               Non-hemodynamically significant plaque <50% noted in the  CCA. The                ECA appears <50% stenosed.   Vertebrals:  Right vertebral artery demonstrates antegrade flow. Left  vertebral              artery demonstrates bidirectional  flow.  Subclavians: Right subclavian artery flow was disturbed. Left subclavian  flow              was biphasic.   *See table(s) above for measurements and observations.  Suggest follow up study in 12 months.   Echo 06/2023: 1. Left ventricular ejection fraction, by estimation, is 55 to 60%. The  left ventricle has normal function. The left ventricle has no regional  wall motion abnormalities. There is mild left ventricular hypertrophy.  Left ventricular diastolic function could not be evaluated.   2. Right ventricular systolic function is normal. The right ventricular  size is normal. There is moderately elevated pulmonary artery systolic  pressure.   3. Left atrial size was moderately dilated.   4. Right atrial size was moderately dilated.   5. The mitral valve is  normal in structure. Trivial mitral valve  regurgitation. No evidence of mitral stenosis. Moderate mitral annular  calcification.   6. The aortic valve is tricuspid. Aortic valve regurgitation is mild to  moderate. Aortic valve sclerosis/calcification is present, without any  evidence of aortic stenosis.   7. The inferior vena cava is normal in size with greater than 50%  respiratory variability, suggesting right atrial pressure of 3 mmHg.  Echo 10/2022:  1. Left ventricular ejection fraction, by estimation, is 55 to 60%. The  left ventricle has normal function. The left ventricle has no regional  wall motion abnormalities. There is mild concentric left ventricular  hypertrophy. Left ventricular diastolic  parameters are indeterminate. The average left ventricular global  longitudinal strain is -19.6 %. The global longitudinal strain is normal.   2. Right ventricular systolic function is low normal. The right  ventricular size is normal. There is normal pulmonary artery systolic  pressure. The estimated right ventricular systolic pressure is 35.7 mmHg.   3. Left atrial size was severely dilated.   4. Right atrial size was moderately dilated.   5. The mitral valve is degenerative. Mild mitral valve regurgitation.  Moderate mitral annular calcification.   6. The aortic valve is tricuspid. There is mild calcification of the  aortic valve. Aortic valve regurgitation is mild. Aortic valve sclerosis  is present, with no evidence of aortic valve stenosis. Aortic  regurgitation PHT measures 560 msec.   7. The inferior vena cava is normal in size with greater than 50%  respiratory variability, suggesting right atrial pressure of 3 mmHg.   Comparison(s): Prior images reviewed side by side. LVEF remains normal range at 55-60%.   Physical Exam:   VS:  There were no vitals taken for this visit.   Wt Readings from Last 3 Encounters:  07/15/24 146 lb 0.6 oz (66.2 kg)  06/25/24 155 lb (70.3 kg)   04/16/24 124 lb (56.2 kg)    GEN: Well nourished, well developed in no acute distress, appears emotionally depressed NECK: No JVD; bilateral carotid bruits noted CARDIAC: S1/S2, RRR, no murmurs, rubs, gallops RESPIRATORY:  Clear to auscultation without rales, wheezing or rhonchi  ABDOMEN: Soft, non-tender, non-distended EXTREMITIES:  No edema; No deformity   ASSESSMENT AND PLAN: .    HFpEF Stage C, NYHA class I-II symptoms. EF 55-60% 06/2023. Euvolemic and well compensated on exam. Continue current medication regimen. Low sodium diet, fluid restriction <2L, and daily weights encouraged. Educated to contact our office for weight gain of 2 lbs overnight or 5 lbs in one week. Will obtain CBC, CMET.   PAF, medication management Denies any palpitations or tachycardia.  EKG reveals SB, doing  well. Continue current medication regimen and continue Xarelto  20 mg daily.  Continue Xarelto  for stroke prevention.  Will obtain thyroid  panel, Digoxin  level and CMET.  She denies any bleeding issues and is on appropriate dosage of Xarelto .  Heart healthy diet and regular cardiovascular exercise encouraged. Care and ED precautions discussed.   HLD, carotid artery stenosis Denies any  symptoms.  See most recent carotid duplex results noted above. Denies any symptoms.  LDL 24 06/2023.  Continue rosuvastatin .  Continue to follow-up with Dr. Darron.   COPD Denies any recent symptoms.  Continue current medication regimen.  Continue follow-up with Dr. Darlean.    Medication Adjustments/Labs and Tests Ordered: Current medicines are reviewed at length with the patient today.  Concerns regarding medicines are outlined above.   Tests Ordered: No orders of the defined types were placed in this encounter.   Medication Changes: No orders of the defined types were placed in this encounter.   I spent a total duration of 30 minutes reviewing prior notes, reviewing outside records including  labs, EKG today,  face-to-face counseling of medical condition, pathophysiology, evaluation, management, and documenting the findings in the note.   Follow Up:  In Person in 6 month(s)  Signed, Almarie Crate, NP

## 2024-08-04 ENCOUNTER — Other Ambulatory Visit: Payer: Self-pay

## 2024-08-07 ENCOUNTER — Ambulatory Visit: Payer: Self-pay

## 2024-08-07 ENCOUNTER — Other Ambulatory Visit: Payer: Self-pay

## 2024-08-07 MED ORDER — AMOXICILLIN-POT CLAVULANATE 875-125 MG PO TABS: 1.0000 | ORAL_TABLET | Freq: Two times a day (BID) | ORAL | 0 refills | Status: DC

## 2024-08-07 NOTE — Telephone Encounter (Signed)
 FYI Only or Action Required?: Action required by provider: clinical question for provider and update on patient condition.  Patient was last seen in primary care on 07/15/2024 by Bevely Doffing, FNP.  Called Nurse Triage reporting Cough.  Symptoms began 2 days ago.  Interventions attempted: Rest, hydration, or home remedies.  Symptoms are: unchanged.  Triage Disposition: See HCP Within 4 Hours (Or PCP Triage)  Patient/caregiver understands and will follow disposition?: No, wishes to speak with PCP  Copied from CRM #[id number]. Topic: Clinical - Red Word Triage >> Aug 07, 2024 11:34 AM Willma SAUNDERS wrote: Red Word that prompted transfer to Nurse Triage: Patient thinks she has pneumonia, has a cough and back/rib pain, feels like she has a broken rib. Has had symptoms for 2 days and the rib pain is getting worse. Reason for Disposition  [1] MILD difficulty breathing (e.g., minimal/no SOB at rest, SOB with walking, pulse < 100) AND [2] still present when not coughing  Answer Assessment - Initial Assessment Questions Patient calling to report productive cough with rib pain for the last two days. Patient is concerned that she has PNA as her symptoms are similar to other times she has had PNA. Patient is unable to go to a location to be evaluated as she doesn't have a ride. Patient is asking for medication to be sent to her pharmacy. Asking for a call back from the office.   1. ONSET: When did the cough begin?      Started 2 days ago 2. SEVERITY: How bad is the cough today?      mild 3. SPUTUM: Describe the color of your sputum (e.g., none, dry cough; clear, white, yellow, green)     Yellow tint 4. HEMOPTYSIS: Are you coughing up any blood? If Yes, ask: How much? (e.g., flecks, streaks, tablespoons, etc.)     no 5. DIFFICULTY BREATHING: Are you having difficulty breathing? If Yes, ask: How bad is it? (e.g., mild, moderate, severe)      mild 6. FEVER: Do you have a fever? If  Yes, ask: What is your temperature, how was it measured, and when did it start?     no 7. CARDIAC HISTORY: Do you have any history of heart disease? (e.g., heart attack, congestive heart failure)      yes 8. LUNG HISTORY: Do you have any history of lung disease?  (e.g., pulmonary embolus, asthma, emphysema)     yes 9. PE RISK FACTORS: Do you have a history of blood clots? (or: recent major surgery, recent prolonged travel, bedridden)     no 10. OTHER SYMPTOMS: Do you have any other symptoms? (e.g., runny nose, wheezing, chest pain)       Rib pain from coughing, wheezing-patient reports she is about to take a breathing treatment 12. TRAVEL: Have you traveled out of the country in the last month? (e.g., travel history, exposures)       no  Protocols used: Cough - Acute Productive-A-AH

## 2024-08-18 ENCOUNTER — Other Ambulatory Visit: Payer: Self-pay | Admitting: Licensed Clinical Social Worker

## 2024-08-26 ENCOUNTER — Encounter: Payer: Self-pay | Admitting: Internal Medicine

## 2024-08-26 ENCOUNTER — Other Ambulatory Visit (HOSPITAL_COMMUNITY): Payer: Self-pay

## 2024-08-27 ENCOUNTER — Other Ambulatory Visit: Payer: Self-pay

## 2024-08-28 ENCOUNTER — Other Ambulatory Visit: Payer: Self-pay

## 2024-08-28 ENCOUNTER — Other Ambulatory Visit (HOSPITAL_COMMUNITY): Payer: Self-pay

## 2024-09-01 ENCOUNTER — Ambulatory Visit

## 2024-10-09 ENCOUNTER — Ambulatory Visit: Admitting: Nurse Practitioner

## 2024-10-19 ENCOUNTER — Ambulatory Visit

## 2024-10-26 ENCOUNTER — Ambulatory Visit: Payer: Self-pay

## 2025-06-28 ENCOUNTER — Ambulatory Visit
